# Patient Record
Sex: Female | Born: 1945 | Race: White | Hispanic: No | Marital: Married | Smoking: Former smoker
Health system: Southern US, Community
[De-identification: ages and names within clinical notes are randomized; demographics above are authoritative.]

## PROBLEM LIST (undated history)

## (undated) DIAGNOSIS — R600 Localized edema: Secondary | ICD-10-CM

## (undated) DIAGNOSIS — C9000 Multiple myeloma not having achieved remission: Secondary | ICD-10-CM

## (undated) DIAGNOSIS — E559 Vitamin D deficiency, unspecified: Secondary | ICD-10-CM

## (undated) DIAGNOSIS — J189 Pneumonia, unspecified organism: Secondary | ICD-10-CM

## (undated) DIAGNOSIS — K635 Polyp of colon: Secondary | ICD-10-CM

## (undated) DIAGNOSIS — J45909 Unspecified asthma, uncomplicated: Secondary | ICD-10-CM

## (undated) DIAGNOSIS — J90 Pleural effusion, not elsewhere classified: Secondary | ICD-10-CM

## (undated) DIAGNOSIS — I10 Essential (primary) hypertension: Secondary | ICD-10-CM

## (undated) DIAGNOSIS — E041 Nontoxic single thyroid nodule: Secondary | ICD-10-CM

## (undated) DIAGNOSIS — B019 Varicella without complication: Secondary | ICD-10-CM

## (undated) DIAGNOSIS — K5792 Diverticulitis of intestine, part unspecified, without perforation or abscess without bleeding: Secondary | ICD-10-CM

## (undated) DIAGNOSIS — R188 Other ascites: Secondary | ICD-10-CM

## (undated) DIAGNOSIS — E785 Hyperlipidemia, unspecified: Secondary | ICD-10-CM

## (undated) DIAGNOSIS — N390 Urinary tract infection, site not specified: Secondary | ICD-10-CM

## (undated) DIAGNOSIS — K219 Gastro-esophageal reflux disease without esophagitis: Secondary | ICD-10-CM

## (undated) DIAGNOSIS — D702 Other drug-induced agranulocytosis: Secondary | ICD-10-CM

## (undated) DIAGNOSIS — C801 Malignant (primary) neoplasm, unspecified: Secondary | ICD-10-CM

## (undated) HISTORY — PX: COLOSTOMY REVERSAL: SHX5782

## (undated) HISTORY — PX: KYPHOPLASTY: SHX5884

## (undated) HISTORY — DX: Malignant (primary) neoplasm, unspecified: C80.1

## (undated) HISTORY — DX: Other drug-induced agranulocytosis: D70.2

## (undated) HISTORY — PX: CHOLECYSTECTOMY: SHX55

## (undated) HISTORY — DX: Diverticulitis of intestine, part unspecified, without perforation or abscess without bleeding: K57.92

## (undated) HISTORY — DX: Polyp of colon: K63.5

## (undated) HISTORY — DX: Localized edema: R60.0

## (undated) HISTORY — PX: BREAST BIOPSY: SHX20

## (undated) HISTORY — DX: Unspecified asthma, uncomplicated: J45.909

## (undated) HISTORY — PX: ABDOMINAL HYSTERECTOMY: SHX81

## (undated) HISTORY — DX: Varicella without complication: B01.9

## (undated) HISTORY — DX: Gastro-esophageal reflux disease without esophagitis: K21.9

## (undated) HISTORY — DX: Nontoxic single thyroid nodule: E04.1

## (undated) HISTORY — DX: Essential (primary) hypertension: I10

## (undated) HISTORY — DX: Pneumonia, unspecified organism: J18.9

## (undated) HISTORY — DX: Other ascites: R18.8

## (undated) HISTORY — DX: Pleural effusion, not elsewhere classified: J90

## (undated) HISTORY — DX: Vitamin D deficiency, unspecified: E55.9

## (undated) HISTORY — DX: Urinary tract infection, site not specified: N39.0

## (undated) HISTORY — DX: Hyperlipidemia, unspecified: E78.5

---

## 1998-04-30 ENCOUNTER — Ambulatory Visit: Admission: RE | Admit: 1998-04-30 | Discharge: 1998-04-30 | Payer: Self-pay | Admitting: Oncology

## 2005-03-04 ENCOUNTER — Ambulatory Visit: Payer: Self-pay

## 2011-07-24 ENCOUNTER — Encounter: Payer: Self-pay | Admitting: Orthopedic Surgery

## 2011-08-21 ENCOUNTER — Encounter: Payer: Self-pay | Admitting: Orthopedic Surgery

## 2011-09-08 ENCOUNTER — Ambulatory Visit: Payer: Self-pay | Admitting: Pain Medicine

## 2011-09-22 ENCOUNTER — Ambulatory Visit: Payer: Self-pay | Admitting: Pain Medicine

## 2014-06-22 DIAGNOSIS — M8580 Other specified disorders of bone density and structure, unspecified site: Secondary | ICD-10-CM | POA: Insufficient documentation

## 2014-07-21 DIAGNOSIS — D649 Anemia, unspecified: Secondary | ICD-10-CM | POA: Insufficient documentation

## 2014-07-21 DIAGNOSIS — M549 Dorsalgia, unspecified: Secondary | ICD-10-CM | POA: Insufficient documentation

## 2014-07-21 DIAGNOSIS — I10 Essential (primary) hypertension: Secondary | ICD-10-CM | POA: Insufficient documentation

## 2014-07-27 ENCOUNTER — Ambulatory Visit: Payer: Self-pay | Admitting: Internal Medicine

## 2014-10-26 DIAGNOSIS — E538 Deficiency of other specified B group vitamins: Secondary | ICD-10-CM | POA: Insufficient documentation

## 2015-01-25 DIAGNOSIS — J302 Other seasonal allergic rhinitis: Secondary | ICD-10-CM | POA: Insufficient documentation

## 2015-07-02 ENCOUNTER — Other Ambulatory Visit: Payer: Self-pay | Admitting: Internal Medicine

## 2015-07-02 DIAGNOSIS — Z1231 Encounter for screening mammogram for malignant neoplasm of breast: Secondary | ICD-10-CM

## 2015-07-30 ENCOUNTER — Ambulatory Visit: Payer: Self-pay

## 2015-08-30 ENCOUNTER — Ambulatory Visit
Admission: RE | Admit: 2015-08-30 | Discharge: 2015-08-30 | Disposition: A | Discharge status: Home / Self Care | Payer: Commercial Managed Care - HMO | Source: Ambulatory Visit | Attending: Internal Medicine | Admitting: Internal Medicine

## 2015-08-30 ENCOUNTER — Other Ambulatory Visit: Payer: Self-pay | Admitting: Internal Medicine

## 2015-08-30 ENCOUNTER — Ambulatory Visit: Payer: Self-pay

## 2015-08-30 DIAGNOSIS — Z1231 Encounter for screening mammogram for malignant neoplasm of breast: Secondary | ICD-10-CM | POA: Insufficient documentation

## 2015-10-01 DIAGNOSIS — K219 Gastro-esophageal reflux disease without esophagitis: Secondary | ICD-10-CM | POA: Insufficient documentation

## 2016-10-20 HISTORY — PX: COLOSTOMY: SHX63

## 2017-03-01 DIAGNOSIS — K922 Gastrointestinal hemorrhage, unspecified: Secondary | ICD-10-CM | POA: Insufficient documentation

## 2017-03-01 DIAGNOSIS — C9000 Multiple myeloma not having achieved remission: Secondary | ICD-10-CM | POA: Insufficient documentation

## 2017-03-01 DIAGNOSIS — E876 Hypokalemia: Secondary | ICD-10-CM | POA: Insufficient documentation

## 2017-03-18 DIAGNOSIS — R600 Localized edema: Secondary | ICD-10-CM | POA: Insufficient documentation

## 2017-03-18 DIAGNOSIS — Z933 Colostomy status: Secondary | ICD-10-CM | POA: Insufficient documentation

## 2017-09-07 DIAGNOSIS — Z9889 Other specified postprocedural states: Secondary | ICD-10-CM | POA: Insufficient documentation

## 2017-12-02 LAB — HM DEXA SCAN

## 2017-12-22 DIAGNOSIS — C7951 Secondary malignant neoplasm of bone: Secondary | ICD-10-CM | POA: Insufficient documentation

## 2018-01-04 ENCOUNTER — Telehealth: Payer: Self-pay | Admitting: Pharmacist

## 2018-01-04 ENCOUNTER — Inpatient Hospital Stay: Payer: Medicare PPO | Attending: Oncology | Admitting: Oncology

## 2018-01-04 ENCOUNTER — Telehealth: Payer: Self-pay

## 2018-01-04 ENCOUNTER — Other Ambulatory Visit: Payer: Self-pay

## 2018-01-04 ENCOUNTER — Inpatient Hospital Stay: Payer: Medicare PPO

## 2018-01-04 ENCOUNTER — Encounter: Payer: Self-pay | Admitting: Oncology

## 2018-01-04 VITALS — BP 118/77 | HR 71 | Temp 97.8°F | Resp 16 | Ht 62.0 in | Wt 130.7 lb

## 2018-01-04 DIAGNOSIS — R600 Localized edema: Secondary | ICD-10-CM | POA: Diagnosis not present

## 2018-01-04 DIAGNOSIS — M545 Low back pain: Secondary | ICD-10-CM | POA: Insufficient documentation

## 2018-01-04 DIAGNOSIS — Z885 Allergy status to narcotic agent status: Secondary | ICD-10-CM | POA: Diagnosis not present

## 2018-01-04 DIAGNOSIS — Z79899 Other long term (current) drug therapy: Secondary | ICD-10-CM | POA: Diagnosis not present

## 2018-01-04 DIAGNOSIS — R5383 Other fatigue: Secondary | ICD-10-CM

## 2018-01-04 DIAGNOSIS — Z720 Tobacco use: Secondary | ICD-10-CM | POA: Insufficient documentation

## 2018-01-04 DIAGNOSIS — K1379 Other lesions of oral mucosa: Secondary | ICD-10-CM | POA: Insufficient documentation

## 2018-01-04 DIAGNOSIS — M549 Dorsalgia, unspecified: Secondary | ICD-10-CM

## 2018-01-04 DIAGNOSIS — I10 Essential (primary) hypertension: Secondary | ICD-10-CM | POA: Diagnosis not present

## 2018-01-04 DIAGNOSIS — G8929 Other chronic pain: Secondary | ICD-10-CM | POA: Insufficient documentation

## 2018-01-04 DIAGNOSIS — G629 Polyneuropathy, unspecified: Secondary | ICD-10-CM | POA: Diagnosis not present

## 2018-01-04 DIAGNOSIS — D696 Thrombocytopenia, unspecified: Secondary | ICD-10-CM | POA: Diagnosis not present

## 2018-01-04 DIAGNOSIS — Z7189 Other specified counseling: Secondary | ICD-10-CM | POA: Insufficient documentation

## 2018-01-04 DIAGNOSIS — R5381 Other malaise: Secondary | ICD-10-CM

## 2018-01-04 DIAGNOSIS — C7951 Secondary malignant neoplasm of bone: Secondary | ICD-10-CM | POA: Insufficient documentation

## 2018-01-04 DIAGNOSIS — Z87891 Personal history of nicotine dependence: Secondary | ICD-10-CM | POA: Diagnosis not present

## 2018-01-04 DIAGNOSIS — M4856XS Collapsed vertebra, not elsewhere classified, lumbar region, sequela of fracture: Secondary | ICD-10-CM | POA: Insufficient documentation

## 2018-01-04 DIAGNOSIS — S32050D Wedge compression fracture of fifth lumbar vertebra, subsequent encounter for fracture with routine healing: Secondary | ICD-10-CM

## 2018-01-04 DIAGNOSIS — K219 Gastro-esophageal reflux disease without esophagitis: Secondary | ICD-10-CM

## 2018-01-04 DIAGNOSIS — X58XXXS Exposure to other specified factors, sequela: Secondary | ICD-10-CM | POA: Diagnosis not present

## 2018-01-04 DIAGNOSIS — E785 Hyperlipidemia, unspecified: Secondary | ICD-10-CM | POA: Insufficient documentation

## 2018-01-04 DIAGNOSIS — C9002 Multiple myeloma in relapse: Secondary | ICD-10-CM | POA: Diagnosis not present

## 2018-01-04 DIAGNOSIS — Z5111 Encounter for antineoplastic chemotherapy: Secondary | ICD-10-CM | POA: Insufficient documentation

## 2018-01-04 LAB — COMPREHENSIVE METABOLIC PANEL
ALK PHOS: 85 U/L (ref 38–126)
ALT: 17 U/L (ref 14–54)
ANION GAP: 7 (ref 5–15)
AST: 19 U/L (ref 15–41)
Albumin: 3.4 g/dL — ABNORMAL LOW (ref 3.5–5.0)
BILIRUBIN TOTAL: 1.2 mg/dL (ref 0.3–1.2)
BUN: 13 mg/dL (ref 6–20)
CALCIUM: 8.4 mg/dL — AB (ref 8.9–10.3)
CO2: 24 mmol/L (ref 22–32)
Chloride: 105 mmol/L (ref 101–111)
Creatinine, Ser: 0.64 mg/dL (ref 0.44–1.00)
GFR calc Af Amer: >60 mL/min (ref >60)
GLUCOSE: 138 mg/dL — AB (ref 65–99)
POTASSIUM: 3.9 mmol/L (ref 3.5–5.1)
Sodium: 136 mmol/L (ref 135–145)
TOTAL PROTEIN: 7.9 g/dL (ref 6.5–8.1)

## 2018-01-04 LAB — CBC WITH DIFFERENTIAL/PLATELET
BASOS ABS: 0 10*3/uL (ref 0–0.1)
BASOS PCT: 0 %
Eosinophils Absolute: 0 10*3/uL (ref 0–0.7)
Eosinophils Relative: 1 %
HEMATOCRIT: 40.5 % (ref 35.0–47.0)
Hemoglobin: 13.7 g/dL (ref 12.0–16.0)
Lymphocytes Relative: 18 %
Lymphs Abs: 1 10*3/uL (ref 1.0–3.6)
MCH: 31.2 pg (ref 26.0–34.0)
MCHC: 33.8 g/dL (ref 32.0–36.0)
MCV: 92.1 fL (ref 80.0–100.0)
MONO ABS: 0.7 10*3/uL (ref 0.2–0.9)
Monocytes Relative: 12 %
NEUTROS ABS: 4.1 10*3/uL (ref 1.4–6.5)
Neutrophils Relative %: 69 %
Platelets: 166 10*3/uL (ref 150–440)
RBC: 4.39 MIL/uL (ref 3.80–5.20)
RDW: 14.5 % (ref 11.5–14.5)
WBC: 5.9 10*3/uL (ref 3.6–11.0)

## 2018-01-04 NOTE — Telephone Encounter (Signed)
Oral Chemotherapy Pharmacist Encounter   Received call and fax from [place] that there was an issue with this patient's REMS. The patient had both Revlimid and Pomalyst active. I informed them that the patient was transitioning from Revlimid to Pomalyst and they could inactivate the Revlimid profile.  In order for our office to be issued a REMs auth number, the patient's old office will have to call and cancel their authorization. From Epic it looks like the prescription for Pomalyst was written by Dr. Agapito Games [facility], tele: [phone]). Call Dr. Dorthy Cooler office and left message for his nurse asking that they call [employer] REMS and cancel their authorization. I also asked that they let me know when they had a chance to make that call to [place].  Darl Pikes, PharmD, BCPS Hematology/Oncology Clinical Pharmacist [facility] [phone]  01/04/2018 2:39 PM

## 2018-01-04 NOTE — Progress Notes (Signed)
Hematology/Oncology Consult note [facility] [phone] [phone]   Patient Care Team: Patient, No Pcp Per as PCP - General (General Practice)  REFERRING PROVIDER: Dr. Tonia Brooms  CHIEF COMPLAINTS/PURPOSE OF CONSULTATION:  Evaluation of MM  HISTORY OF PRESENTING ILLNESS:  Cassie Jones is a  72 y.o.  female with PMH listed below who was referred to me for evaluation of multiple myeloma.  Patient used to follow-up with local oncologist Dr. Tonia Brooms and [facility] oncologist Dr. Amalia Hailey.  Patient tells me that as her husband works every day and has a busy working schedule, she is not able to get transportation to her treatment.  As an alternative she is now living with her brother who is close to [facility] [place] and want to transfer care here. Extensive medical record review was performed.    Patient was diagnosed with IgG lambda, ISS2, t (4,14) multiple myeloma on February 03, 2017, she has hemoglobin of 9, calcium 8.9, LDH 132, beta microglobulin 4.6, albumin 3.2.  Skeletal survey negative.  PET scan with single nonspecific humeral lesion and a thyroid nodule.  Marrow with 60% plasmacytosis by CD138 IHC, FISH with hyperdiploidy t(4, 14), 1 q. gain.. Per note, she was treated with CyborD (M spike 3.6 at start)  from 01/2017 to 02/2017. Her treatment was switched to Revlimid Velcade dexamethasone from 02/2017, M spike 1.2 at start,and was found to have progression of disease in 09/2017, when her M spike increased to 1.8, and developed compression fracture in 10/2016. She was restarted on RVD using weekly Velcade.  She was recently seen by Dr. Boris Sharper and recommended to start on a combination of daratumumab, Pomalyst, dexamethasone. Per patient she received her first daratumumab last week with split dose 76m/kg given on 3/14 and 3/15.  Patient reports that she tolerate the treatment well except mild infusion reaction.  Patient  also reports that she has got dental clearance and has been given 1 dose of Zometa by Dr. VRemus Loffler(unkown dates).  [facility]  Review of Systems  Constitutional: Positive for malaise/fatigue. Negative for chills and fever.  HENT: Negative for ear discharge, hearing loss and nosebleeds.   Eyes: Negative for photophobia and pain.  Respiratory: Negative for cough and shortness of breath.   Cardiovascular: Negative for chest pain.  Gastrointestinal: Negative for heartburn, nausea and vomiting.  Genitourinary: Negative for dysuria and urgency.  Musculoskeletal: Positive for back pain.  Skin: Negative for rash.  Neurological: Negative for dizziness and weakness.  Endo/Heme/Allergies: Does not bruise/bleed easily.  Psychiatric/Behavioral: Negative for depression.    MEDICAL HISTORY:  Past Medical History:  Diagnosis Date  . [place]   . GERD (gastroesophageal reflux disease)   . Hypertension     SURGICAL HISTORY: Past Surgical History:  Procedure Laterality Date  . BREAST BIOPSY Bilateral yrs ago   benign  . COLOSTOMY  2018  . COLOSTOMY REVERSAL     11 2018    SOCIAL HISTORY: Social History   Socioeconomic History  . Marital status: Married    Spouse name: Not on file  . Number of children: Not on file  . Years of education: Not on file  . Highest education level: Not on file  Social Needs  . Financial resource strain: Not on file  . Food insecurity - worry: Not on file  . Food insecurity - inability: Not on file  .  Transportation needs - medical: Not on file  . Transportation needs - non-medical: Not on file  Occupational History  . Not on file  Tobacco Use  . Smoking status: Not on file  Substance and Sexual Activity  . Alcohol use: Not on file  . Drug use: Not on file  . Sexual activity: Not on file  Other Topics Concern  . Not on file  Social History  Narrative  . Not on file    FAMILY HISTORY: Family History  Problem Relation Age of Onset  . Breast cancer Mother 13  . Cancer Brother 66       proastate     ALLERGIES:  is allergic to codeine.  MEDICATIONS:  Current Outpatient Medications  Medication Sig Dispense Refill  . albuterol (VENTOLIN HFA) 108 (90 Base) MCG/ACT inhaler Inhale 2 puffs into the lungs every 6 (six) hours as needed.    . montelukast (SINGULAIR) 10 MG tablet Take 1 tablet by mouth daily.    . ondansetron (ZOFRAN) 8 MG tablet Take 1 tablet by mouth every 8 (eight) hours as needed.    Marland Kitchen oxyCODONE (OXY IR/ROXICODONE) 5 MG immediate release tablet Take 1 tablet by mouth 3 (three) times daily as needed.    . pantoprazole (PROTONIX) 40 MG tablet Take 1 tablet by mouth every morning.    Marland Kitchen POMALYST 4 MG capsule Take 1 tablet by mouth daily.    . pravastatin (PRAVACHOL) 10 MG tablet Take 1 tablet by mouth at bedtime.    Marland Kitchen spironolactone (ALDACTONE) 25 MG tablet Take 1 tablet by mouth daily.     No current facility-administered medications for this visit.      PHYSICAL EXAMINATION: ECOG PERFORMANCE STATUS: 1 - Symptomatic but completely ambulatory Vitals:   01/04/18 1044 01/04/18 1104  BP:  118/77  Pulse:  71  Resp: 16   Temp:  97.8 F (36.6 C)   Filed Weights   01/04/18 1044  Weight: 130 lb 11.2 oz (59.3 kg)    Physical Exam  Constitutional: She is oriented to person, place, and time and well-developed, well-nourished, and in no distress. No distress.  HENT:  Head: Normocephalic and atraumatic.  Eyes: EOM are normal. Pupils are equal, round, and reactive to light. No scleral icterus.  Neck: Normal range of motion. Neck supple.  Cardiovascular: Normal rate and regular rhythm.  No murmur heard. Pulmonary/Chest: Effort normal and breath sounds normal.  Abdominal: Soft. She exhibits no distension.  Musculoskeletal: Normal range of motion. She exhibits no edema.  Lymphadenopathy:    She has no cervical  adenopathy.  Neurological: She is alert and oriented to person, place, and time.  Skin: Skin is warm and dry. No erythema.  Psychiatric: Affect and judgment normal.     LABORATORY DATA:  I have reviewed the data as listed Lab Results  Component Value Date   WBC 5.9 01/04/2018   HGB 13.7 01/04/2018   HCT 40.5 01/04/2018   MCV 92.1 01/04/2018   PLT 166 01/04/2018   Recent Labs    01/04/18 1308  NA 136  K 3.9  CL 105  CO2 24  GLUCOSE 138*  BUN 13  CREATININE 0.64  CALCIUM 8.4*  GFRNONAA >60  GFRAA >60  PROT 7.9  ALBUMIN 3.4*  AST 19  ALT 17  ALKPHOS 85  BILITOT 1.2       ASSESSMENT & PLAN:  1. Multiple myeloma in relapse (Max)   2. Goals of care, counseling/discussion   3. Closed compression fracture  of L5 lumbar vertebra with routine healing, subsequent encounter   4. Chronic midline low back pain without sciatica   5. Neuropathy    #Multiple myeloma : discussed with patient that we will continue her current regimen with daratumumab, dexamethasone, plus minus Pomalyst.  Our pharmacist had a conversation with patient and will follow up on her Pomalyst status.  Discussed with patient that the treatment plan is based on her medical records and a bone marrow results done at outside facility.  Plan daratumumab 16 mg/m2 weekly for total of 8 weeks, following by Q2 weeks from week 9-24, followed by monthly from week 25 and beyond.  Will add Pomalyst D1-21 every 28 days when medication is available to patient.   Patient reports that she has Singulair 36m prior to treatment. Refer to Radonc for evaluation of palliative radiation.  # Neuropathy: continue gabapentin.  # Check labs today, cbc, cmp, SPEP, IFE, light chain.  # Chronic back pain: continue oxycodone 580mQ8h as needed.  All questions were answered. The patient knows to call the clinic with any problems questions or concerns.  Return of visit: 01/08/2018 for assessment prior to week 2 daratumumab.  Thank you  for this kind referral and the opportunity to participate in the care of this patient. A copy of today's note is routed to referring provider    ZhEarlie ServerMD, PhD Hematology Oncology [facility]- [phone]/18/2019

## 2018-01-04 NOTE — Progress Notes (Signed)
START ON PATHWAY REGIMEN - Multiple Myeloma and Other Plasma Cell Dyscrasias     A cycle is every 28 days:     Daratumumab      Pomalidomide      Dexamethasone      Dexamethasone   **Always confirm dose/schedule in your pharmacy ordering system**    Patient Characteristics: Relapsed / Refractory, All Lines of Therapy R-ISS Staging: II Disease Classification: Relapsed Line of Therapy: Second Line Intent of Therapy: Non-Curative / Palliative Intent, Discussed with Patient

## 2018-01-04 NOTE — Telephone Encounter (Signed)
Oral Oncology Pharmacy Student Encounter  Received new prescription for Pomalyst (pomalidomide) for the treatment of multiple myeloma in conjunction with daratumumab, planned duration until progression or unacceptable toxicities.  CBC and CMET from 01/04/18 assessed, no signicant lab abnormalities identified.  Prescription dose and frequency assessed.  Current medication list in Epic reviewed, no relevant DDIs with Pomalyst identified.  Prescription has been e-scribed to the [facility] for benefits analysis and approval.  Oral Oncology Clinic will continue to follow for insurance authorization, copayment issues, initial counseling and start date.

## 2018-01-04 NOTE — Progress Notes (Signed)
Patient here for initial visit. She has chronic back pain, she had bowl surgery last year.

## 2018-01-05 ENCOUNTER — Telehealth: Payer: Self-pay | Admitting: Oncology

## 2018-01-05 ENCOUNTER — Inpatient Hospital Stay: Payer: Medicare PPO

## 2018-01-05 NOTE — Telephone Encounter (Signed)
Oral Oncology Patient Advocate Encounter  Called [employer] to check on patients Pomalyst being shipped out. They are trying to reach patient to set up shipment.   Will check back later [place] Patient Advocate [phone] 01/05/2018 9:45 AM

## 2018-01-05 NOTE — Patient Instructions (Addendum)
Daratumumab injection What is this medicine? DARATUMUMAB (dar a toom ue mab) is a monoclonal antibody. It is used to treat multiple myeloma. This medicine may be used for other purposes; ask your health care provider or pharmacist if you have questions. COMMON BRAND NAME(S): DARZALEX What should I tell my health care provider before I take this medicine? They need to know if you have any of these conditions: -infection (especially a virus infection such as chickenpox, cold sores, or herpes) -lung or breathing disease -pregnant or trying to get pregnant -breast-feeding -an unusual or allergic reaction to daratumumab, other medicines, foods, dyes, or preservatives How should I use this medicine? This medicine is for infusion into a vein. It is given by a health care professional in a hospital or clinic setting. Talk to your pediatrician regarding the use of this medicine in children. Special care may be needed. Overdosage: If you think you have taken too much of this medicine contact a poison control center or emergency room at once. NOTE: This medicine is only for you. Do not share this medicine with others. What if I miss a dose? Keep appointments for follow-up doses as directed. It is important not to miss your dose. Call your doctor or health care professional if you are unable to keep an appointment. What may interact with this medicine? Interactions have not been studied. Give your health care provider a list of all the medicines, herbs, non-prescription drugs, or dietary supplements you use. Also tell them if you smoke, drink alcohol, or use illegal drugs. Some items may interact with your medicine. This list may not describe all possible interactions. Give your health care provider a list of all the medicines, herbs, non-prescription drugs, or dietary supplements you use. Also tell them if you smoke, drink alcohol, or use illegal drugs. Some items may interact with your medicine. What  should I watch for while using this medicine? This drug may make you feel generally unwell. Report any side effects. Continue your course of treatment even though you feel ill unless your doctor tells you to stop. This medicine can cause serious allergic reactions. To reduce your risk you may need to take medicine before treatment with this medicine. Take your medicine as directed. This medicine can affect the results of blood tests to match your blood type. These changes can last for up to 6 months after the final dose. Your healthcare provider will do blood tests to match your blood type before you start treatment. Tell all of your healthcare providers that you are being treated with this medicine before receiving a blood transfusion. This medicine can affect the results of some tests used to determine treatment response; extra tests may be needed to evaluate response. Do not become pregnant while taking this medicine or for 3 months after stopping it. Women should inform their doctor if they wish to become pregnant or think they might be pregnant. There is a potential for serious side effects to an unborn child. Talk to your health care professional or pharmacist for more information. What side effects may I notice from receiving this medicine? Side effects that you should report to your doctor or health care professional as soon as possible: -allergic reactions like skin rash, itching or hives, swelling of the face, lips, or tongue -breathing problems -chills -cough -dizziness -feeling faint or lightheaded -headache -low blood counts - this medicine may decrease the number of white blood cells, red blood cells and platelets. You may be at increased risk  for infections and bleeding. -nausea, vomiting -shortness of breath -signs of decreased platelets or bleeding - bruising, pinpoint red spots on the skin, black, tarry stools, blood in the urine -signs of decreased red blood cells - unusually  weak or tired, feeling faint or lightheaded, falls -signs of infection - fever or chills, cough, sore throat, pain or difficulty passing urine Side effects that usually do not require medical attention (report to your doctor or health care professional if they continue or are bothersome): -back pain -diarrhea -muscle cramps -pain, tingling, numbness in the hands or feet -swelling of the ankles, feet, hands -tiredness This list may not describe all possible side effects. Call your doctor for medical advice about side effects. You may report side effects to FDA at 1-800-FDA-1088. Where should I keep my medicine? Keep out of the reach of children. This drug is given in a hospital or clinic and will not be stored at home. NOTE: This sheet is a summary. It may not cover all possible information. If you have questions about this medicine, talk to your doctor, pharmacist, or health care provider.  2018 Elsevier/Gold Standard (2015-11-08 10:38:11)  

## 2018-01-05 NOTE — Telephone Encounter (Signed)
Oral Oncology Patient Advocate Encounter  Called patient and asked her to please call [employer] to get her Pomalyst set up. Gave her their number to call.    [facility] Patient Advocate [phone] 01/05/2018 2:07 PM

## 2018-01-05 NOTE — Telephone Encounter (Signed)
Oral Chemotherapy Pharmacist Encounter   Amy, RN from Dr. Agapito Games office called me this morning and gave me the information on the Pomalyst REMs Auth # [id number]. They sent the prescription to [facility]. With this information we can fill her first cycle of Pomalyst then write for the pomalyst from our office with cycle 2.   We will follow-up with [facility] about this patient's prescription  Darl Pikes, PharmD, BCPS Hematology/Oncology Clinical Pharmacist [facility] [phone]  01/05/2018 8:29 AM

## 2018-01-06 LAB — MULTIPLE MYELOMA PANEL, SERUM
ALBUMIN/GLOB SERPL: 0.9 (ref 0.7–1.7)
ALPHA 1: 0.3 g/dL (ref 0.0–0.4)
ALPHA2 GLOB SERPL ELPH-MCNC: 1.1 g/dL — AB (ref 0.4–1.0)
Albumin SerPl Elph-Mcnc: 3.2 g/dL (ref 2.9–4.4)
B-Globulin SerPl Elph-Mcnc: 0.9 g/dL (ref 0.7–1.3)
GAMMA GLOB SERPL ELPH-MCNC: 1.8 g/dL (ref 0.4–1.8)
GLOBULIN, TOTAL: 4 g/dL — AB (ref 2.2–3.9)
IGA: 15 mg/dL — AB (ref 87–352)
IgG (Immunoglobin G), Serum: 2397 mg/dL — ABNORMAL HIGH (ref 700–1600)
IgM (Immunoglobulin M), Srm: 27 mg/dL (ref 26–217)
M Protein SerPl Elph-Mcnc: 1.5 g/dL — ABNORMAL HIGH
Total Protein ELP: 7.2 g/dL (ref 6.0–8.5)

## 2018-01-06 NOTE — Telephone Encounter (Signed)
Oral Oncology Patient Advocate Encounter   Called [employer] patients Pomalyst will arrive today.   [place] Patient Advocate [phone] 01/06/2018 9:04 AM

## 2018-01-07 ENCOUNTER — Ambulatory Visit
Admission: RE | Admit: 2018-01-07 | Discharge: 2018-01-07 | Disposition: A | Discharge status: Home / Self Care | Payer: Medicare PPO | Source: Ambulatory Visit | Attending: Radiation Oncology | Admitting: Radiation Oncology

## 2018-01-07 ENCOUNTER — Encounter: Payer: Self-pay | Admitting: Radiation Oncology

## 2018-01-07 ENCOUNTER — Other Ambulatory Visit: Payer: Self-pay

## 2018-01-07 VITALS — BP 144/93 | HR 59 | Temp 98.0°F | Resp 16 | Ht 62.0 in | Wt 135.7 lb

## 2018-01-07 DIAGNOSIS — K219 Gastro-esophageal reflux disease without esophagitis: Secondary | ICD-10-CM | POA: Diagnosis not present

## 2018-01-07 DIAGNOSIS — E041 Nontoxic single thyroid nodule: Secondary | ICD-10-CM | POA: Diagnosis not present

## 2018-01-07 DIAGNOSIS — C9002 Multiple myeloma in relapse: Secondary | ICD-10-CM | POA: Diagnosis not present

## 2018-01-07 DIAGNOSIS — I10 Essential (primary) hypertension: Secondary | ICD-10-CM | POA: Insufficient documentation

## 2018-01-07 DIAGNOSIS — Z803 Family history of malignant neoplasm of breast: Secondary | ICD-10-CM | POA: Diagnosis not present

## 2018-01-07 DIAGNOSIS — Z87891 Personal history of nicotine dependence: Secondary | ICD-10-CM | POA: Insufficient documentation

## 2018-01-07 DIAGNOSIS — Z79899 Other long term (current) drug therapy: Secondary | ICD-10-CM | POA: Insufficient documentation

## 2018-01-07 NOTE — Telephone Encounter (Signed)
Oral Chemotherapy Pharmacist Encounter   Spoke with [name] over the phone. She has received her medication in the mail. I asked her to bring the medication with her to clinic tomorrow and she will receive further instruction about starting her medication. I will also meet with the patient while she is in infusion and review her medication calendar.  Additional, I spoke with [facility] and requested they fax our office for future refill request as the patient has transferred her care to our office.   Cassie Jones, Cassie Jones, Cassie Jones Hematology/Oncology Clinical Pharmacist [facility] [phone]  01/07/2018 3:11 PM

## 2018-01-07 NOTE — Consult Note (Signed)
NEW PATIENT EVALUATION  Name: Cassie Jones  MRN: [id number]  Date:   01/07/2018     DOB: 1945/11/27   This 72 y.o. female patient presents to the clinic for initial evaluation of palliative ration therapy to lumbar spine for involvement of multiple myeloma.  REFERRING PHYSICIAN: Vicente, [place]:  Chief Complaint  Patient presents with  . Multiple Myeloma    DIAGNOSIS: The encounter diagnosis was Multiple myeloma in relapse ([place]).   PREVIOUS INVESTIGATIONS:  MRI scan of thoracic lumbar scan requested for my review Pathology reports reviewed Clinical notes reviewed  HPI: patient is a 72 year old female originally diagnosed in April 2018 with IgG lambda multiple myeloma. PET CT scan at that time showed nonspecific humeral lesion and a thyroid nodule. She had 60% plasmacytosis. She is currently onon a combination of daratumumab, Pomalyst, dexamethasone.under medical oncology's care. [facility]. She recent had her care transferred to be closer to relatives in [place]. She's been having significant back painnd developed a subacute fracture by MRI criteria involving L3. She had a kyphoplasty performed in February 2019 is currently on narcotic analgesics. She was referred to radiation oncology for consideration of palliative treatment to this region.she is in significant pain at this time although she is ambulating well has no focal neurologic deficits. Her cares been switched here in [place] and she is now referred to radiation oncology for consideration again of palliative treatment to the L3 vertebral body pain status post kyphoplasty.    PLANNED TREATMENT REGIMEN: palliative radiation therapy to L3  PAST MEDICAL HISTORY:  has a past medical history of Cancer ([place]), GERD (gastroesophageal reflux disease), and Hypertension.    PAST SURGICAL HISTORY:  Past Surgical History:  Procedure Laterality Date  . BREAST BIOPSY Bilateral yrs ago   benign  .  COLOSTOMY  2018  . COLOSTOMY REVERSAL     11 2018    FAMILY HISTORY: family history includes Breast cancer (age of onset: 52) in her mother; Cancer (age of onset: 84) in her brother.  SOCIAL HISTORY:  reports that she has quit smoking. She has never used smokeless tobacco. She reports that she drank alcohol. She reports that she does not use drugs.  ALLERGIES: Codeine  MEDICATIONS:  Current Outpatient Medications  Medication Sig Dispense Refill  . albuterol (VENTOLIN HFA) 108 (90 Base) MCG/ACT inhaler Inhale 2 puffs into the lungs every 6 (six) hours as needed.    . montelukast (SINGULAIR) 10 MG tablet Take 1 tablet by mouth daily.    . ondansetron (ZOFRAN) 8 MG tablet Take 1 tablet by mouth every 8 (eight) hours as needed.    Marland Kitchen oxyCODONE (OXY IR/ROXICODONE) 5 MG immediate release tablet Take 1 tablet by mouth 3 (three) times daily as needed.    . pantoprazole (PROTONIX) 40 MG tablet Take 1 tablet by mouth every morning.    Marland Kitchen POMALYST 4 MG capsule Take 1 tablet by mouth daily.    . pravastatin (PRAVACHOL) 10 MG tablet Take 1 tablet by mouth at bedtime.    Marland Kitchen spironolactone (ALDACTONE) 25 MG tablet Take 1 tablet by mouth daily.     No current facility-administered medications for this encounter.     ECOG PERFORMANCE STATUS:  1 - Symptomatic but completely ambulatory  REVIEW OF SYSTEMS: except for the significant back pain Patient denies any weight loss, fatigue, weakness, fever, chills or night sweats. Patient denies any loss of vision, blurred vision. Patient denies any ringing  of the ears or hearing loss.  No irregular heartbeat. Patient denies heart murmur or history of fainting. Patient denies any chest pain or pain radiating to her upper extremities. Patient denies any shortness of breath, difficulty breathing at night, cough or hemoptysis. Patient denies any swelling in the lower legs. Patient denies any nausea vomiting, vomiting of blood, or coffee ground material in the vomitus.  Patient denies any stomach pain. Patient states has had normal bowel movements no significant constipation or diarrhea. Patient denies any dysuria, hematuria or significant nocturia. Patient denies any problems walking, swelling in the joints or loss of balance. Patient denies any skin changes, loss of hair or loss of weight. Patient denies any excessive worrying or anxiety or significant depression. Patient denies any problems with insomnia. Patient denies excessive thirst, polyuria, polydipsia. Patient denies any swollen glands, patient denies easy bruising or easy bleeding. Patient denies any recent infections, allergies or URI. Patient "s visual fields have not changed significantly in recent time.    PHYSICAL EXAM: BP (!) 144/93 (BP Location: Right Wrist, Patient Position: Sitting, Cuff Size: Small)   Pulse (!) 59   Temp 98 F (36.7 C) (Tympanic)   Resp 16   Ht 5' 2" (1.575 m)   Wt 135 lb 11.2 oz (61.6 kg)   BMI 24.82 kg/m  A well-developed female in NAD. She is in exquisite pain to touch of the L3 vertebral body region. Motor sensory and DTR levels are equal and symmetric in the lower extremities. Proprioception is intact. Well-developed well-nourished patient in NAD. HEENT reveals PERLA, EOMI, discs not visualized.  Oral cavity is clear. No oral mucosal lesions are identified. Neck is clear without evidence of cervical or supraclavicular adenopathy. Lungs are clear to A&P. Cardiac examination is essentially unremarkable with regular rate and rhythm without murmur rub or thrill. Abdomen is benign with no organomegaly or masses noted. Motor sensory and DTR levels are equal and symmetric in the upper and lower extremities. Cranial nerves II through XII are grossly intact. Proprioception is intact. No peripheral adenopathy or edema is identified. No motor or sensory levels are noted. Crude visual fields are within normal range.  LABORATORY DATA: pathology reports are reviewed    RADIOLOGY  RESULTS:MRI scans of thoracic lumbar spine reports reviewed films have been requested for my review   IMPRESSION: multiple myeloma with subacute fracture of L3 most likely secondary to myelomatous involvement for palliative radiation therapy to the L3 vertebral body in 72 year old female  PLAN: at this time I to go ahead with a course of palliative radiation therapy to her L3 vertebral body. Would plan on delivering 3000 cGy in 10 fractions. Risks and benefits of treatment including possible diarrhea fatigue alteration of blood counts skin reaction all were discussed in detail. I personally set up and ordered CT simulation on the patient for early next week. She has a daily infusion tomorrow for her chemotherapy. Patient comprehends my treatment plan well.  I would like to take this opportunity to thank you for allowing me to participate in the care of your patient.Noreene Filbert, MD

## 2018-01-08 ENCOUNTER — Other Ambulatory Visit: Payer: Self-pay | Admitting: *Deleted

## 2018-01-08 ENCOUNTER — Inpatient Hospital Stay: Payer: Medicare PPO

## 2018-01-08 ENCOUNTER — Other Ambulatory Visit: Payer: Self-pay

## 2018-01-08 ENCOUNTER — Inpatient Hospital Stay (HOSPITAL_BASED_OUTPATIENT_CLINIC_OR_DEPARTMENT_OTHER): Payer: Medicare PPO | Admitting: Oncology

## 2018-01-08 ENCOUNTER — Encounter: Payer: Self-pay | Admitting: Oncology

## 2018-01-08 VITALS — BP 135/70 | HR 68 | Temp 95.7°F | Resp 28 | Wt 133.7 lb

## 2018-01-08 DIAGNOSIS — Z5111 Encounter for antineoplastic chemotherapy: Secondary | ICD-10-CM | POA: Diagnosis not present

## 2018-01-08 DIAGNOSIS — G629 Polyneuropathy, unspecified: Secondary | ICD-10-CM

## 2018-01-08 DIAGNOSIS — C9002 Multiple myeloma in relapse: Secondary | ICD-10-CM

## 2018-01-08 DIAGNOSIS — X58XXXS Exposure to other specified factors, sequela: Secondary | ICD-10-CM | POA: Diagnosis not present

## 2018-01-08 DIAGNOSIS — R5381 Other malaise: Secondary | ICD-10-CM | POA: Diagnosis not present

## 2018-01-08 DIAGNOSIS — C7951 Secondary malignant neoplasm of bone: Secondary | ICD-10-CM

## 2018-01-08 DIAGNOSIS — K219 Gastro-esophageal reflux disease without esophagitis: Secondary | ICD-10-CM | POA: Diagnosis not present

## 2018-01-08 DIAGNOSIS — M4856XS Collapsed vertebra, not elsewhere classified, lumbar region, sequela of fracture: Secondary | ICD-10-CM

## 2018-01-08 DIAGNOSIS — G8929 Other chronic pain: Secondary | ICD-10-CM

## 2018-01-08 DIAGNOSIS — S32050D Wedge compression fracture of fifth lumbar vertebra, subsequent encounter for fracture with routine healing: Secondary | ICD-10-CM

## 2018-01-08 DIAGNOSIS — I10 Essential (primary) hypertension: Secondary | ICD-10-CM

## 2018-01-08 DIAGNOSIS — M545 Low back pain: Secondary | ICD-10-CM | POA: Diagnosis not present

## 2018-01-08 DIAGNOSIS — Z79899 Other long term (current) drug therapy: Secondary | ICD-10-CM

## 2018-01-08 DIAGNOSIS — R5383 Other fatigue: Secondary | ICD-10-CM

## 2018-01-08 DIAGNOSIS — Z5112 Encounter for antineoplastic immunotherapy: Secondary | ICD-10-CM

## 2018-01-08 DIAGNOSIS — Z7189 Other specified counseling: Secondary | ICD-10-CM

## 2018-01-08 DIAGNOSIS — Z885 Allergy status to narcotic agent status: Secondary | ICD-10-CM

## 2018-01-08 DIAGNOSIS — Z87891 Personal history of nicotine dependence: Secondary | ICD-10-CM

## 2018-01-08 LAB — CBC WITH DIFFERENTIAL/PLATELET
BASOS PCT: 0 %
Basophils Absolute: 0 10*3/uL (ref 0–0.1)
EOS ABS: 0.1 10*3/uL (ref 0–0.7)
Eosinophils Relative: 1 %
HCT: 36.8 % (ref 35.0–47.0)
HEMOGLOBIN: 12.6 g/dL (ref 12.0–16.0)
LYMPHS ABS: 0.7 10*3/uL — AB (ref 1.0–3.6)
Lymphocytes Relative: 13 %
MCH: 31.3 pg (ref 26.0–34.0)
MCHC: 34.2 g/dL (ref 32.0–36.0)
MCV: 91.7 fL (ref 80.0–100.0)
Monocytes Absolute: 0.8 10*3/uL (ref 0.2–0.9)
Monocytes Relative: 14 %
NEUTROS PCT: 72 %
Neutro Abs: 3.8 10*3/uL (ref 1.4–6.5)
Platelets: 146 10*3/uL — ABNORMAL LOW (ref 150–440)
RBC: 4.01 MIL/uL (ref 3.80–5.20)
RDW: 14.5 % (ref 11.5–14.5)
WBC: 5.4 10*3/uL (ref 3.6–11.0)

## 2018-01-08 LAB — COMPREHENSIVE METABOLIC PANEL
ALT: 15 U/L (ref 14–54)
ANION GAP: 7 (ref 5–15)
AST: 15 U/L (ref 15–41)
Albumin: 3.2 g/dL — ABNORMAL LOW (ref 3.5–5.0)
Alkaline Phosphatase: 73 U/L (ref 38–126)
BUN: 13 mg/dL (ref 6–20)
CALCIUM: 8.7 mg/dL — AB (ref 8.9–10.3)
CHLORIDE: 105 mmol/L (ref 101–111)
CO2: 24 mmol/L (ref 22–32)
CREATININE: 0.71 mg/dL (ref 0.44–1.00)
Glucose, Bld: 107 mg/dL — ABNORMAL HIGH (ref 65–99)
Potassium: 3.9 mmol/L (ref 3.5–5.1)
Sodium: 136 mmol/L (ref 135–145)
Total Bilirubin: 1.1 mg/dL (ref 0.3–1.2)
Total Protein: 7.3 g/dL (ref 6.5–8.1)

## 2018-01-08 LAB — TYPE AND SCREEN
ABO/RH(D): B POS
ANTIBODY SCREEN: POSITIVE

## 2018-01-08 MED ORDER — PROCHLORPERAZINE MALEATE 10 MG PO TABS
10.0000 mg | ORAL_TABLET | Freq: Once | ORAL | Status: AC
  Administered 2018-01-08: 10 mg via ORAL
  Filled 2018-01-08: qty 1

## 2018-01-08 MED ORDER — DEXAMETHASONE SODIUM PHOSPHATE 100 MG/10ML IJ SOLN
20.0000 mg | Freq: Once | INTRAMUSCULAR | Status: AC
  Administered 2018-01-08: 20 mg via INTRAVENOUS
  Filled 2018-01-08: qty 2

## 2018-01-08 MED ORDER — SODIUM CHLORIDE 0.9 % IV SOLN
900.0000 mg | Freq: Once | INTRAVENOUS | Status: AC
  Administered 2018-01-08: 900 mg via INTRAVENOUS
  Filled 2018-01-08: qty 15

## 2018-01-08 MED ORDER — MORPHINE SULFATE (PF) 2 MG/ML IV SOLN
2.0000 mg | Freq: Once | INTRAVENOUS | Status: AC
  Administered 2018-01-08: 2 mg via INTRAVENOUS
  Filled 2018-01-08: qty 1

## 2018-01-08 MED ORDER — OXYCODONE HCL ER 10 MG PO T12A: 10.0000 mg | EXTENDED_RELEASE_TABLET | Freq: Two times a day (BID) | ORAL | 0 refills | Status: DC

## 2018-01-08 MED ORDER — SODIUM CHLORIDE 0.9 % IV SOLN
Freq: Once | INTRAVENOUS | Status: AC
  Administered 2018-01-08: 11:00:00 via INTRAVENOUS
  Filled 2018-01-08: qty 1000

## 2018-01-08 MED ORDER — ACETAMINOPHEN 325 MG PO TABS
650.0000 mg | ORAL_TABLET | Freq: Once | ORAL | Status: AC
  Administered 2018-01-08: 650 mg via ORAL
  Filled 2018-01-08: qty 2

## 2018-01-08 MED ORDER — DIPHENHYDRAMINE HCL 25 MG PO CAPS
50.0000 mg | ORAL_CAPSULE | Freq: Once | ORAL | Status: AC
  Administered 2018-01-08: 50 mg via ORAL
  Filled 2018-01-08: qty 2

## 2018-01-08 NOTE — Telephone Encounter (Signed)
Oral Chemotherapy Pharmacist Encounter   Met with patient in infusion and reviewed Pomalyst calendar with her. She know that he is to start her Pomalyst today.    Darl Pikes, PharmD, BCPS Hematology/Oncology Clinical Pharmacist [facility] [phone]  01/08/2018 11:55 AM

## 2018-01-08 NOTE — Progress Notes (Signed)
Here for follow up.  In pain she stated w level 8pain -stated pain meds "ease up pain" seen by Dr Donella Stade 3/21-and feels hopeful about resolving back pain

## 2018-01-08 NOTE — Progress Notes (Signed)
Hematology/Oncology Consult note [facility] [phone] [phone]   Patient Care Team: Patient, No Pcp Per as PCP - General (General Practice)  REFERRING PROVIDER: Dr. Tonia Brooms  CHIEF COMPLAINTS/PURPOSE OF CONSULTATION:  Evaluation of MM  HISTORY OF PRESENTING ILLNESS:  Cassie Jones is a  72 y.o.  female with PMH listed below who was referred to me for evaluation of multiple myeloma.  Patient used to follow-up with local oncologist Dr. Tonia Brooms and [facility] oncologist Dr. Amalia Hailey.  Patient tells me that as her husband works every day and has a busy working schedule, she is not able to get transportation to her treatment.  As an alternative she is now living with her brother who is close to [facility] [place] and want to transfer care here. Extensive medical record review was performed.    Patient was diagnosed with IgG lambda, ISS2, t (4,14) multiple myeloma on February 03, 2017, she has hemoglobin of 9, calcium 8.9, LDH 132, beta microglobulin 4.6, albumin 3.2.  Skeletal survey negative.  PET scan with single nonspecific humeral lesion and a thyroid nodule.  Marrow with 60% plasmacytosis by CD138 IHC, FISH with hyperdiploidy t(4, 14), 1 q. gain.. Per note, she was treated with CyborD (M spike 3.6 at start)  from 01/2017 to 02/2017. Her treatment was switched to Revlimid Velcade dexamethasone from 02/2017, M spike 1.2 at start,and was found to have progression of disease in 09/2017, when her M spike increased to 1.8, and developed compression fracture in 10/2016. She was restarted on RVD using weekly Velcade.  She was recently seen by Dr. Boris Sharper and recommended to start on a combination of daratumumab, Pomalyst, dexamethasone. Per patient she received her first daratumumab last week with split dose '8mg'$ /kg given on 3/14 and 3/15.  Patient reports that she tolerate the treatment well except mild infusion reaction.  Patient  also reports that she has got dental clearance and has been given 1 dose of Zometa by Dr. Remus Loffler (unkown dates).  Autologous transplant has been discussed with patient at [facility] and patient is still in the process of deciding.  Current Treatment # 3/14, 3/15 at outside facility, she got split dose of daratumumab. #  01/08/2018 Start on weekly daratumumab '16mg'$ /m2, dexamethasone '20mg'$ , Pomalyst '4mg'$  Day 1-21 ogf 28 day cycle.   INTERVAL HISTORY Cassie Jones is a 72 y.o. female who has above history reviewed by me today presents for follow up visit for management of multiple myeloma. Reports uncontrolled back pain. She has Oxycodone '5mg'$  Q8h as needed, feels pain is not controlled adequately. She has been referred to Radonc and plan palliative RT next week.  Reports feeling fatigue. Her sister in law accompanied her to clinic today.    Review of Systems  Constitutional: Positive for malaise/fatigue. Negative for chills, fever and weight loss.  HENT: Negative for ear discharge, hearing loss and nosebleeds.   Eyes: Negative for photophobia and pain.  Respiratory: Negative for cough, sputum production and shortness of breath.   Cardiovascular: Negative for chest pain, palpitations and orthopnea.  Gastrointestinal: Negative for heartburn, nausea and vomiting.  Genitourinary: Negative for dysuria, frequency and urgency.  Musculoskeletal: Positive for back pain.  Skin: Negative for rash.  Neurological: Negative for dizziness, tingling, tremors, sensory change and weakness.  Endo/Heme/Allergies: Does not bruise/bleed easily.  Psychiatric/Behavioral: Negative for depression, hallucinations and substance abuse.    MEDICAL HISTORY:  Past Medical History:  Diagnosis Date  . Cancer ([place])   . GERD (gastroesophageal  reflux disease)   . Hypertension     SURGICAL HISTORY: Past Surgical History:  Procedure Laterality Date  . BREAST BIOPSY Bilateral yrs ago   benign  . COLOSTOMY  2018  .  COLOSTOMY REVERSAL     11 2018    SOCIAL HISTORY: Social History   Socioeconomic History  . Marital status: Married    Spouse name: Not on file  . Number of children: Not on file  . Years of education: Not on file  . Highest education level: Not on file  Occupational History  . Not on file  Social Needs  . Financial resource strain: Not on file  . Food insecurity:    Worry: Not on file    Inability: Not on file  . Transportation needs:    Medical: Not on file    Non-medical: Not on file  Tobacco Use  . Smoking status: Former Research scientist (life sciences)  . Smokeless tobacco: Never Used  Substance and Sexual Activity  . Alcohol use: Not Currently  . Drug use: Never  . Sexual activity: Not on file  Lifestyle  . Physical activity:    Days per week: Not on file    Minutes per session: Not on file  . Stress: Not on file  Relationships  . Social connections:    Talks on phone: Not on file    Gets together: Not on file    Attends religious service: Not on file    Active member of club or organization: Not on file    Attends meetings of clubs or organizations: Not on file    Relationship status: Not on file  . Intimate partner violence:    Fear of current or ex partner: Not on file    Emotionally abused: Not on file    Physically abused: Not on file    Forced sexual activity: Not on file  Other Topics Concern  . Not on file  Social History Narrative  . Not on file    FAMILY HISTORY: Family History  Problem Relation Age of Onset  . Breast cancer Mother 68  . Cancer Brother 35       proastate     ALLERGIES:  is allergic to codeine.  MEDICATIONS:  Current Outpatient Medications  Medication Sig Dispense Refill  . montelukast (SINGULAIR) 10 MG tablet Take 1 tablet by mouth daily.    Marland Kitchen oxyCODONE (OXY IR/ROXICODONE) 5 MG immediate release tablet Take 1 tablet by mouth 3 (three) times daily as needed.    . pantoprazole (PROTONIX) 40 MG tablet Take 1 tablet by mouth every morning.    .  pravastatin (PRAVACHOL) 10 MG tablet Take 1 tablet by mouth at bedtime.    Marland Kitchen spironolactone (ALDACTONE) 25 MG tablet Take 1 tablet by mouth daily.    Marland Kitchen albuterol (VENTOLIN HFA) 108 (90 Base) MCG/ACT inhaler Inhale 2 puffs into the lungs every 6 (six) hours as needed.    . ondansetron (ZOFRAN) 8 MG tablet Take 1 tablet by mouth every 8 (eight) hours as needed.    Marland Kitchen oxyCODONE (OXYCONTIN) 10 mg 12 hr tablet Take 1 tablet (10 mg total) by mouth every 12 (twelve) hours. 28 tablet 0  . POMALYST 4 MG capsule Take 1 tablet by mouth daily.     No current facility-administered medications for this visit.      PHYSICAL EXAMINATION: ECOG PERFORMANCE STATUS: 1 - Symptomatic but completely ambulatory Vitals:   01/08/18 0928  BP: 135/70  Pulse: 68  Resp: (!) 28  Temp: (!) 95.7 F (35.4 C)   Filed Weights   01/08/18 0928  Weight: 133 lb 11.2 oz (60.6 kg)    Physical Exam  Constitutional: She is oriented to person, place, and time. No distress.  HENT:  Head: Normocephalic and atraumatic.  Mouth/Throat: No oropharyngeal exudate.  Eyes: Pupils are equal, round, and reactive to light. EOM are normal. No scleral icterus.  Neck: Normal range of motion. Neck supple.  Cardiovascular: Normal rate and regular rhythm.  No murmur heard. Pulmonary/Chest: Effort normal.  Abdominal: Soft. She exhibits no distension.  Musculoskeletal: Normal range of motion. She exhibits no edema.  Lymphadenopathy:    She has no cervical adenopathy.  Neurological: She is alert and oriented to person, place, and time. No cranial nerve deficit.  Skin: Skin is warm and dry. No erythema.  Psychiatric: Affect normal.     LABORATORY DATA:  I have reviewed the data as listed Lab Results  Component Value Date   WBC 5.4 01/08/2018   HGB 12.6 01/08/2018   HCT 36.8 01/08/2018   MCV 91.7 01/08/2018   PLT 146 (L) 01/08/2018   Recent Labs    01/04/18 1308 01/08/18 0902  NA 136 136  K 3.9 3.9  CL 105 105  CO2 24 24    GLUCOSE 138* 107*  BUN 13 13  CREATININE 0.64 0.71  CALCIUM 8.4* 8.7*  GFRNONAA >60 >60  GFRAA >60 >60  PROT 7.9 7.3  ALBUMIN 3.4* 3.2*  AST 19 15  ALT 17 15  ALKPHOS 85 73  BILITOT 1.2 1.1       ASSESSMENT & PLAN:  1. Multiple myeloma in relapse ([place])   2. Goals of care, counseling/discussion   3. Closed compression fracture of L5 lumbar vertebra with routine healing, subsequent encounter   4. Bone metastasis ([place])   5. Encounter for antineoplastic immunotherapy   6. Encounter for antineoplastic chemotherapy    #Multiple myeloma :  I explained to the patient the risks and benefits of Daratumumab and Pomalyst  including all but not limited to allergic reaction, confusion, CNS toxicity, nausea, vomiting, low blood counts, bleeding, and risk of life threatening infection and even death, secondary malignancy etc.  I will obtain a type and screen although she has already got her first treatment of Daratumumab at a outside facility. I have talked to labs and they will check blood bank from where patient transferred her care from to see if they have obtain any pretretment type and screen there.   Current chemotherapy plan daratumumab 16 mg/m2 weekly for total of 8 weeks, following by Q2 weeks from week 9-24, followed by monthly from week 25 and beyond.  Today Pomalyst D1-21 every 28 days.  She has singulair and brought her bottle to clinic. She took Singulair during this encounter. She forgets to take Dexamethasone but have the medication. I will give her Dexamethasone '20mg'$  IV today Advise patient to take her Dexamethasone '20mg'$  within 1-3 hour prior to her Daratumumab treatment. Also take her Singulair prior to treatment.   # Follow up with Radonc for palliative radiation.  # Neuropathy: continue gabapentin.  # # Chronic back pain: continue oxycodone '5mg'$  Q8h as needed. Start  All questions were answered. The patient knows to call the clinic with any problems questions or  concerns.  Return of visit: 1 week to see NP [name] for week 3 Daratumumab. See me in 2 weeks for week 4 Daratumumab.    Earlie Server, MD, PhD Hematology [facility]- [phone] 01/08/2018

## 2018-01-11 ENCOUNTER — Other Ambulatory Visit: Payer: Self-pay | Admitting: Family Medicine

## 2018-01-11 ENCOUNTER — Ambulatory Visit
Admission: RE | Admit: 2018-01-11 | Discharge: 2018-01-11 | Disposition: A | Discharge status: Home / Self Care | Payer: Medicare PPO | Source: Ambulatory Visit | Attending: Radiation Oncology | Admitting: Radiation Oncology

## 2018-01-11 DIAGNOSIS — C9002 Multiple myeloma in relapse: Secondary | ICD-10-CM | POA: Diagnosis present

## 2018-01-14 ENCOUNTER — Inpatient Hospital Stay (HOSPITAL_BASED_OUTPATIENT_CLINIC_OR_DEPARTMENT_OTHER): Payer: Medicare PPO | Admitting: Oncology

## 2018-01-14 ENCOUNTER — Telehealth: Payer: Self-pay | Admitting: *Deleted

## 2018-01-14 ENCOUNTER — Inpatient Hospital Stay: Payer: Medicare PPO

## 2018-01-14 ENCOUNTER — Ambulatory Visit: Payer: Medicare PPO

## 2018-01-14 ENCOUNTER — Ambulatory Visit
Admission: RE | Admit: 2018-01-14 | Discharge: 2018-01-14 | Disposition: A | Discharge status: Home / Self Care | Payer: Medicare PPO | Source: Ambulatory Visit | Attending: Oncology | Admitting: Oncology

## 2018-01-14 ENCOUNTER — Other Ambulatory Visit: Payer: Self-pay | Admitting: *Deleted

## 2018-01-14 VITALS — BP 146/78 | HR 60 | Temp 98.0°F | Wt 135.7 lb

## 2018-01-14 DIAGNOSIS — R609 Edema, unspecified: Secondary | ICD-10-CM

## 2018-01-14 DIAGNOSIS — I10 Essential (primary) hypertension: Secondary | ICD-10-CM

## 2018-01-14 DIAGNOSIS — Z79899 Other long term (current) drug therapy: Secondary | ICD-10-CM | POA: Diagnosis not present

## 2018-01-14 DIAGNOSIS — Z885 Allergy status to narcotic agent status: Secondary | ICD-10-CM

## 2018-01-14 DIAGNOSIS — C7951 Secondary malignant neoplasm of bone: Secondary | ICD-10-CM

## 2018-01-14 DIAGNOSIS — Z5111 Encounter for antineoplastic chemotherapy: Secondary | ICD-10-CM | POA: Diagnosis not present

## 2018-01-14 DIAGNOSIS — R0602 Shortness of breath: Secondary | ICD-10-CM

## 2018-01-14 DIAGNOSIS — C9002 Multiple myeloma in relapse: Secondary | ICD-10-CM | POA: Diagnosis not present

## 2018-01-14 DIAGNOSIS — K219 Gastro-esophageal reflux disease without esophagitis: Secondary | ICD-10-CM | POA: Diagnosis not present

## 2018-01-14 DIAGNOSIS — M545 Low back pain: Secondary | ICD-10-CM

## 2018-01-14 DIAGNOSIS — R600 Localized edema: Secondary | ICD-10-CM | POA: Diagnosis not present

## 2018-01-14 DIAGNOSIS — G629 Polyneuropathy, unspecified: Secondary | ICD-10-CM | POA: Diagnosis not present

## 2018-01-14 DIAGNOSIS — D696 Thrombocytopenia, unspecified: Secondary | ICD-10-CM | POA: Diagnosis not present

## 2018-01-14 DIAGNOSIS — K1379 Other lesions of oral mucosa: Secondary | ICD-10-CM | POA: Insufficient documentation

## 2018-01-14 DIAGNOSIS — Z87891 Personal history of nicotine dependence: Secondary | ICD-10-CM | POA: Diagnosis not present

## 2018-01-14 HISTORY — DX: Multiple myeloma not having achieved remission: C90.00

## 2018-01-14 LAB — CBC WITH DIFFERENTIAL/PLATELET
BASOS ABS: 0 10*3/uL (ref 0–0.1)
Basophils Relative: 0 %
EOS PCT: 2 %
Eosinophils Absolute: 0.1 10*3/uL (ref 0–0.7)
HCT: 36.4 % (ref 35.0–47.0)
Hemoglobin: 12.5 g/dL (ref 12.0–16.0)
LYMPHS PCT: 12 %
Lymphs Abs: 0.3 10*3/uL — ABNORMAL LOW (ref 1.0–3.6)
MCH: 31.2 pg (ref 26.0–34.0)
MCHC: 34.4 g/dL (ref 32.0–36.0)
MCV: 90.8 fL (ref 80.0–100.0)
Monocytes Absolute: 0.3 10*3/uL (ref 0.2–0.9)
Monocytes Relative: 11 %
Neutro Abs: 2.2 10*3/uL (ref 1.4–6.5)
Neutrophils Relative %: 75 %
PLATELETS: 129 10*3/uL — AB (ref 150–440)
RBC: 4.01 MIL/uL (ref 3.80–5.20)
RDW: 14.4 % (ref 11.5–14.5)
WBC: 2.9 10*3/uL — AB (ref 3.6–11.0)

## 2018-01-14 LAB — COMPREHENSIVE METABOLIC PANEL
ALT: 17 U/L (ref 14–54)
ANION GAP: 8 (ref 5–15)
AST: 16 U/L (ref 15–41)
Albumin: 3.1 g/dL — ABNORMAL LOW (ref 3.5–5.0)
Alkaline Phosphatase: 97 U/L (ref 38–126)
BUN: 13 mg/dL (ref 6–20)
CHLORIDE: 103 mmol/L (ref 101–111)
CO2: 24 mmol/L (ref 22–32)
Calcium: 8.7 mg/dL — ABNORMAL LOW (ref 8.9–10.3)
Creatinine, Ser: 0.59 mg/dL (ref 0.44–1.00)
Glucose, Bld: 105 mg/dL — ABNORMAL HIGH (ref 65–99)
POTASSIUM: 3.7 mmol/L (ref 3.5–5.1)
Sodium: 135 mmol/L (ref 135–145)
Total Bilirubin: 1.2 mg/dL (ref 0.3–1.2)
Total Protein: 7.1 g/dL (ref 6.5–8.1)

## 2018-01-14 LAB — FIBRIN DERIVATIVES D-DIMER (ARMC ONLY): FIBRIN DERIVATIVES D-DIMER (ARMC): 3701.3 ng{FEU}/mL — AB (ref 0.00–499.00)

## 2018-01-14 MED ORDER — TECHNETIUM TO 99M ALBUMIN AGGREGATED
4.0300 | Freq: Once | INTRAVENOUS | Status: AC | PRN
  Administered 2018-01-14: 4.03 via INTRAVENOUS

## 2018-01-14 MED ORDER — IOPAMIDOL (ISOVUE-370) INJECTION 76%
75.0000 mL | Freq: Once | INTRAVENOUS | Status: AC | PRN
  Administered 2018-01-14: 75 mL via INTRAVENOUS

## 2018-01-14 MED ORDER — MAGIC MOUTHWASH W/LIDOCAINE: 5.0000 mL | Freq: Three times a day (TID) | ORAL | 0 refills | Status: DC

## 2018-01-14 MED ORDER — TECHNETIUM TC 99M DIETHYLENETRIAME-PENTAACETIC ACID
32.5600 | Freq: Once | INTRAVENOUS | Status: AC | PRN
  Administered 2018-01-14: 32.56 via RESPIRATORY_TRACT

## 2018-01-14 NOTE — Progress Notes (Signed)
Symptom Management Consult note [facility]  [phone] [phone]  Patient Care Team: Patient, No Pcp Per as PCP - General (General Practice)   Name of the patient: Cassie Jones  [id number]  08-16-1946   Date of visit: 01/15/18  Diagnosis- Multiple Myeloma  Chief complaint/ Reason for visit- Swelling  Heme/Onc history: Patient last seen and evaluated by primary oncologist Dr. Tasia Catchings on 01/08/2018 for evaluation of multiple myeloma. Recent transfer of care from [facility] due to transportation issues. At that time she reported uncontrolled back pain. Currently taking oxycodone 5 mg every 8 as needed. She was referred to radiation oncology for palliative radiation next week. Reported feeling fatigued.  Diagnosed with multiple myeloma on 02/03/2017. Skeletal survey was negative. Most recent treatment was recommended Velcade and Decadron. Recently evaluated by Dr. Boris Sharper to start on a combination of daratumumab, Pomalyst and dexamethasone. Received first split dose of therapy on 3/14 and 3/15. Tolerated well except for mild infusion reaction. Has received 1 dose of Zometa. Autologous transplant was discussed that she is still deciding at this time.  Interval history-  Patient complains of edema.  The location of the edema is face, ankle(s) bilateral, feet bilateral.   The edema has been moderate.   Onset of symptoms was 4 days ago, gradually worsening since that time.  The edema is present all day.  The patient states she has never experienced this before.   The swelling has been aggravated by recent chemothearpy, relieved by nothing, and been associated with shortness of breath and weight gain.  Cardiac risk factors include none.  ECOG FS:2 - Symptomatic, <50% confined to bed  Review of systems- Review of Systems  Constitutional: Negative.  Negative for chills, fever, malaise/fatigue and weight loss.  HENT: Negative for congestion and ear pain.    Eyes: Negative.  Negative for blurred vision and double vision.  Respiratory: Positive for cough, shortness of breath and wheezing. Negative for hemoptysis and sputum production.   Cardiovascular: Positive for leg swelling. Negative for chest pain and palpitations.  Gastrointestinal: Positive for nausea. Negative for abdominal pain, constipation, diarrhea and vomiting.  Genitourinary: Negative for dysuria, frequency, hematuria and urgency.  Musculoskeletal: Positive for back pain (chronic). Negative for falls.  Skin: Negative.  Negative for rash.  Neurological: Negative.  Negative for weakness and headaches.  Endo/Heme/Allergies: Negative.  Does not bruise/bleed easily.  Psychiatric/Behavioral: Negative.  Negative for depression. The patient is not nervous/anxious and does not have insomnia.      Current treatment- Daratumumab, Pomalyst and dexamethasone  Allergies  Allergen Reactions  . Codeine Nausea And Vomiting     Past Medical History:  Diagnosis Date  . Cancer ([place])   . GERD (gastroesophageal reflux disease)   . Hypertension   . Multiple myeloma [facility])      Past Surgical History:  Procedure Laterality Date  . BREAST BIOPSY Bilateral yrs ago   benign  . COLOSTOMY  2018  . COLOSTOMY REVERSAL     11 2018    Social History   Socioeconomic History  . Marital status: Married    Spouse name: Not on file  . Number of children: Not on file  . Years of education: Not on file  . Highest education level: Not on file  Occupational History  . Not on file  Social Needs  . Financial resource strain: Not on file  . Food insecurity:    Worry: Not on file    Inability: Not on file  .  Transportation needs:    Medical: Not on file    Non-medical: Not on file  Tobacco Use  . Smoking status: Former Research scientist (life sciences)  . Smokeless tobacco: Never Used  Substance and Sexual Activity  . Alcohol use: Not Currently  . Drug use: Never  . Sexual activity: Not on file  Lifestyle  .  Physical activity:    Days per week: Not on file    Minutes per session: Not on file  . Stress: Not on file  Relationships  . Social connections:    Talks on phone: Not on file    Gets together: Not on file    Attends religious service: Not on file    Active member of club or organization: Not on file    Attends meetings of clubs or organizations: Not on file    Relationship status: Not on file  . Intimate partner violence:    Fear of current or ex partner: Not on file    Emotionally abused: Not on file    Physically abused: Not on file    Forced sexual activity: Not on file  Other Topics Concern  . Not on file  Social History Narrative  . Not on file    Family History  Problem Relation Age of Onset  . Breast cancer Mother 64  . Cancer Brother 61       proastate      Current Outpatient Medications:  .  albuterol (VENTOLIN HFA) 108 (90 Base) MCG/ACT inhaler, Inhale 2 puffs into the lungs every 6 (six) hours as needed., Disp: 1 Inhaler, Rfl: 1 .  levofloxacin (LEVAQUIN) 500 MG tablet, Take 1 tablet (500 mg total) by mouth daily for 7 days., Disp: 7 tablet, Rfl: 0 .  magic mouthwash w/lidocaine SOLN, Take 5 mLs by mouth 3 (three) times daily., Disp: 240 mL, Rfl: 0 .  montelukast (SINGULAIR) 10 MG tablet, Take 1 tablet by mouth daily., Disp: , Rfl:  .  ondansetron (ZOFRAN) 8 MG tablet, Take 1 tablet by mouth every 8 (eight) hours as needed., Disp: , Rfl:  .  oxyCODONE (OXY IR/ROXICODONE) 5 MG immediate release tablet, Take 1 tablet by mouth 3 (three) times daily as needed., Disp: , Rfl:  .  oxyCODONE (OXYCONTIN) 10 mg 12 hr tablet, Take 1 tablet (10 mg total) by mouth every 12 (twelve) hours., Disp: 28 tablet, Rfl: 0 .  pantoprazole (PROTONIX) 40 MG tablet, Take 1 tablet by mouth every morning., Disp: , Rfl:  .  POMALYST 4 MG capsule, Take 1 tablet by mouth daily., Disp: , Rfl:  .  pravastatin (PRAVACHOL) 10 MG tablet, Take 1 tablet by mouth at bedtime., Disp: , Rfl:  .   spironolactone (ALDACTONE) 25 MG tablet, Take 1 tablet by mouth daily., Disp: , Rfl:   Physical exam:  Vitals:   01/14/18 1051  BP: (!) 146/78  Pulse: 60  Temp: 98 F (36.7 C)  SpO2: 96%  Weight: 135 lb 11.2 oz (61.6 kg)   Physical Exam  Constitutional: She is oriented to person, place, and time and well-developed, well-nourished, and in no distress. Vital signs are normal.  HENT:  Head: Normocephalic and atraumatic.  Eyes: Pupils are equal, round, and reactive to light.  Neck: Normal range of motion. No edema present.  Cardiovascular: Normal rate, regular rhythm, normal heart sounds and normal pulses.  No murmur heard. Pulmonary/Chest: Effort normal. She has wheezes in the right upper field. She has rhonchi in the right lower field and the left  lower field.  Abdominal: Soft. Normal appearance and bowel sounds are normal. She exhibits no distension. There is no tenderness. There is no rebound.  Musculoskeletal: Normal range of motion. She exhibits no edema.       Right ankle: She exhibits swelling.       Left ankle: She exhibits swelling.  Neurological: She is alert and oriented to person, place, and time. Gait normal.  Skin: Skin is warm, dry and intact. No rash noted.  Psychiatric: Mood, memory, affect and judgment normal.     CMP Latest Ref Rng & Units 01/14/2018  Glucose 65 - 99 mg/dL 105(H)  BUN 6 - 20 mg/dL 13  Creatinine 0.44 - 1.00 mg/dL 0.59  Sodium 135 - 145 mmol/L 135  Potassium 3.5 - 5.1 mmol/L 3.7  Chloride 101 - 111 mmol/L 103  CO2 22 - 32 mmol/L 24  Calcium 8.9 - 10.3 mg/dL 8.7(L)  Total Protein 6.5 - 8.1 g/dL 7.1  Total Bilirubin 0.3 - 1.2 mg/dL 1.2  Alkaline Phos 38 - 126 U/L 97  AST 15 - 41 U/L 16  ALT 14 - 54 U/L 17   CBC Latest Ref Rng & Units 01/14/2018  WBC 3.6 - 11.0 K/uL 2.9(L)  Hemoglobin 12.0 - 16.0 g/dL 12.5  Hematocrit 35.0 - 47.0 % 36.4  Platelets 150 - 440 K/uL 129(L)    No images are attached to the encounter.  Dg Chest 2  View  Result Date: 01/14/2018 CLINICAL DATA:  Cough and congestion. EXAM: CHEST - 2 VIEW COMPARISON:  Chest x-ray report 02/26/2015. FINDINGS: Mediastinum and hilar structures are normal. Cardiomegaly with normal pulmonary vascularity. COPD. Mild bibasilar atelectasis/infiltrates. Tiny bilateral pleural effusions may be present. Biapical pleural thickening noted consistent scarring. Carotid vascular calcification. Surgical clips right upper quadrant. IMPRESSION: 1. Mild bibasilar atelectasis/infiltrates. Tiny bilateral pleural effusions may be present. 2.  COPD.  Pleural-parenchymal scarring. 3.  Cardiomegaly.  No pulmonary venous congestion. 4.  Carotid vascular disease. Electronically Signed   By: Marcello Moores  Register   On: 01/14/2018 12:34   Nm Pulmonary Perf And Vent  Result Date: 01/14/2018 CLINICAL DATA:  Shortness of Breath EXAM: NUCLEAR MEDICINE VENTILATION - PERFUSION LUNG SCAN VIEWS: Anterior, posterior, right lateral, left lateral, RPO, LPO, RAO, LAO-ventilation and perfusion RADIOPHARMACEUTICALS:  32.56 mCi of Tc-69mDTPA aerosol inhalation and 4.03 mCi Tc942mAA IV COMPARISON:  Chest radiograph January 14, 2018 FINDINGS: Ventilation: There are multiple patchy subsegmental defects throughout the lungs bilaterally on the ventilation study. No well-defined segmental ventilation defect is evident. Perfusion: There are a few rather minimal subsegmental defects on the perfusion study. No segmental or major subsegmental perfusion defect identified. Perfusion defects are smaller than corresponding ventilation defects. There is no perfusion defect disproportionate to a ventilation defect. IMPRESSION: There are scattered small perfusion defects, none of which are segmental or significant subsegmental. There is no perfusion defect disproportionate to a ventilation defect. Several ventilation defects are larger than corresponding perfusion defects. This study constitutes a low probability of pulmonary embolus.  Electronically Signed   By: WiLowella GripII M.D.   On: 01/14/2018 19:00     Assessment and plan- Patient is a [age].o. female who presents for edema in bilateral feet and ankle, wheezing and crackles in lungs and shortness of breath.   1. Multiple Myeloma: S/p Cycle 2 of Daratumumab. Started Pomolyst this past Friday. Scheduled for cycle 3 of daratumumab on Friday, 01/15/2018.  This is likely to be canceled given patient's current condition.  Spoke to [employer]  chemotherapy pharmacist and she agrees that we should hold chemotherapy including Pomolyst until next Friday when she meets with Dr. Tasia Catchings. This will give the opportunity to recover.   2. Shortness of breath: Started X 4 days ago.  Accompanied with a cough with no sputum production.  She denies fevers.  Patient does not have any cardiac history on record.  Patient denies any cardiac history that she is aware of.  Will get chest x-ray and CT to rule out infection and/or pulmonary embolism.  Oxygen saturations okay. RX Levaquin 500 mg PO daily for 7 days. RX albuterol inhaler (2 puffs every 6 hours PRN).   Chest X-ray revealed mild bibasilar atelectasis/infiltrates. Tiny bilateral pleural effusions may be present.  Unable to get CTA due to poor venous access.  Got VQ sacn instead. Low probability of pulmonary embolus. D Dimer elevated. Could be from caner and chemo itself.  Labs reveal: Neuropenia (2.9). Thrombocytopenia 129. Elevated D dimer 3701. Blood cultures negative.   3. Edema: Patient states swelling began approximately 4 days ago.  Weight is up 2 pounds.  Bilateral pedal and ankle swelling present.  +2 pitting pedal edema.  Legs are nonswollen, tender or red.  Patient complains of facial swelling.  I personally cannot tell if her face is swollen.  Bilateral lower lobes rhonchus with rales. Wheezing present bilaterally. Elevate legs   4. Mouth Sores: Started yesterday. RX Magic mouthwash with lidocaine.    Visit Diagnosis 1.  Edema, unspecified type   2. Mouth sores   3. Shortness of breath     Patient expressed understanding and was in agreement with this plan. She also understands that She can call clinic at any time with any questions, concerns, or complaints.   Greater than 50% was spent in counseling and coordination of care with this patient including but not limited to discussion of the relevant topics above (See A&P) including, but not limited to diagnosis and management of acute and chronic medical conditions.    Faythe Casa, AGNP-C [facility] at [place]- [phone] Pager- [phone] 01/15/2018 4:06 PM

## 2018-01-14 NOTE — Telephone Encounter (Signed)
Can we bring her in please. I would like to evaluate her. Thanks

## 2018-01-14 NOTE — Progress Notes (Signed)
v

## 2018-01-14 NOTE — Telephone Encounter (Signed)
Received a call form patient that she has swelling of her face neck and legs and has some questions.  She had her first dose of Darzalex Friday and is scheduled for another one tomorrow. Please advise

## 2018-01-14 NOTE — Telephone Encounter (Signed)
Per caregiver,pt has a swollen lymph node in her neck which is sore, she is congested and has swelling of the face and neck. Appointment given for 10 AM and accepted

## 2018-01-15 ENCOUNTER — Inpatient Hospital Stay: Payer: Medicare PPO

## 2018-01-15 ENCOUNTER — Telehealth: Payer: Self-pay | Admitting: *Deleted

## 2018-01-15 ENCOUNTER — Telehealth: Payer: Self-pay | Admitting: Nurse Practitioner

## 2018-01-15 ENCOUNTER — Inpatient Hospital Stay: Payer: Medicare PPO | Admitting: Nurse Practitioner

## 2018-01-15 MED ORDER — ALBUTEROL SULFATE HFA 108 (90 BASE) MCG/ACT IN AERS: 2.0000 | INHALATION_SPRAY | Freq: Four times a day (QID) | RESPIRATORY_TRACT | 1 refills | Status: AC | PRN

## 2018-01-15 MED ORDER — MAGIC MOUTHWASH W/LIDOCAINE: 5.0000 mL | Freq: Three times a day (TID) | ORAL | 0 refills | Status: DC

## 2018-01-15 MED ORDER — LEVOFLOXACIN 500 MG PO TABS
500.0000 mg | ORAL_TABLET | Freq: Every day | ORAL | 0 refills | Status: DC
Start: 2018-01-15 — End: 2018-01-22

## 2018-01-15 NOTE — Telephone Encounter (Signed)
[name] took care of this. Thanks.

## 2018-01-15 NOTE — Telephone Encounter (Signed)
Spoke to Rulon Abide, NP regarding case. Per her request, orders entered for levaquin and magic mouthwash. Spoke to patient- told her scripts have been sent. Will also refill her albuterol inhaler. Discussed otc options for cough including mucinex and robitussin. Patient will call clinic next week. Currently has f/u on 4/2. If she is improving, we discussed that this can be cancelled and if not, we can see her for re-evaluation. All questions answered.

## 2018-01-15 NOTE — Progress Notes (Signed)
Blood cultures negatives. FYI

## 2018-01-15 NOTE — Telephone Encounter (Signed)
Patient's mother called to report that her script for mouthwash and abt were not sent to the pharmacy yester[facility].

## 2018-01-15 NOTE — Telephone Encounter (Signed)
I called in the MMW with lidocaine to pt's pharmacy per lauren Zenia Resides she told me [facility] [address].

## 2018-01-18 ENCOUNTER — Ambulatory Visit
Admission: RE | Admit: 2018-01-18 | Discharge: 2018-01-18 | Disposition: A | Discharge status: Home / Self Care | Payer: Medicare PPO | Source: Ambulatory Visit | Attending: Radiation Oncology | Admitting: Radiation Oncology

## 2018-01-18 DIAGNOSIS — C9002 Multiple myeloma in relapse: Secondary | ICD-10-CM | POA: Diagnosis not present

## 2018-01-18 DIAGNOSIS — Z510 Encounter for antineoplastic radiation therapy: Secondary | ICD-10-CM | POA: Diagnosis present

## 2018-01-19 ENCOUNTER — Inpatient Hospital Stay: Payer: Medicare PPO | Admitting: Nurse Practitioner

## 2018-01-19 ENCOUNTER — Ambulatory Visit
Admission: RE | Admit: 2018-01-19 | Discharge: 2018-01-19 | Disposition: A | Discharge status: Home / Self Care | Payer: Medicare PPO | Source: Ambulatory Visit | Attending: Radiation Oncology | Admitting: Radiation Oncology

## 2018-01-19 ENCOUNTER — Inpatient Hospital Stay: Payer: Medicare PPO

## 2018-01-19 DIAGNOSIS — Z510 Encounter for antineoplastic radiation therapy: Secondary | ICD-10-CM | POA: Diagnosis not present

## 2018-01-19 LAB — CULTURE, BLOOD (ROUTINE X 2)
CULTURE: NO GROWTH
Culture: NO GROWTH

## 2018-01-20 ENCOUNTER — Ambulatory Visit
Admission: RE | Admit: 2018-01-20 | Discharge: 2018-01-20 | Disposition: A | Discharge status: Home / Self Care | Payer: Medicare PPO | Source: Ambulatory Visit | Attending: Radiation Oncology | Admitting: Radiation Oncology

## 2018-01-20 DIAGNOSIS — Z510 Encounter for antineoplastic radiation therapy: Secondary | ICD-10-CM | POA: Diagnosis not present

## 2018-01-21 ENCOUNTER — Ambulatory Visit
Admission: RE | Admit: 2018-01-21 | Discharge: 2018-01-21 | Disposition: A | Discharge status: Home / Self Care | Payer: Medicare PPO | Source: Ambulatory Visit | Attending: Radiation Oncology | Admitting: Radiation Oncology

## 2018-01-21 DIAGNOSIS — Z510 Encounter for antineoplastic radiation therapy: Secondary | ICD-10-CM | POA: Diagnosis not present

## 2018-01-22 ENCOUNTER — Inpatient Hospital Stay (HOSPITAL_BASED_OUTPATIENT_CLINIC_OR_DEPARTMENT_OTHER): Payer: Medicare PPO | Admitting: Oncology

## 2018-01-22 ENCOUNTER — Inpatient Hospital Stay: Payer: Medicare PPO

## 2018-01-22 ENCOUNTER — Inpatient Hospital Stay: Payer: Medicare PPO | Attending: Oncology

## 2018-01-22 ENCOUNTER — Encounter: Payer: Self-pay | Admitting: Oncology

## 2018-01-22 ENCOUNTER — Ambulatory Visit
Admission: RE | Admit: 2018-01-22 | Discharge: 2018-01-22 | Disposition: A | Discharge status: Home / Self Care | Payer: Medicare PPO | Source: Ambulatory Visit | Attending: Radiation Oncology | Admitting: Radiation Oncology

## 2018-01-22 ENCOUNTER — Other Ambulatory Visit: Payer: Self-pay

## 2018-01-22 VITALS — BP 112/62 | HR 54 | Temp 97.4°F | Resp 18 | Wt 132.8 lb

## 2018-01-22 DIAGNOSIS — T451X5S Adverse effect of antineoplastic and immunosuppressive drugs, sequela: Secondary | ICD-10-CM | POA: Insufficient documentation

## 2018-01-22 DIAGNOSIS — J209 Acute bronchitis, unspecified: Secondary | ICD-10-CM | POA: Diagnosis not present

## 2018-01-22 DIAGNOSIS — I10 Essential (primary) hypertension: Secondary | ICD-10-CM | POA: Diagnosis not present

## 2018-01-22 DIAGNOSIS — R5383 Other fatigue: Secondary | ICD-10-CM

## 2018-01-22 DIAGNOSIS — Z87891 Personal history of nicotine dependence: Secondary | ICD-10-CM | POA: Insufficient documentation

## 2018-01-22 DIAGNOSIS — M7989 Other specified soft tissue disorders: Secondary | ICD-10-CM | POA: Insufficient documentation

## 2018-01-22 DIAGNOSIS — Z7982 Long term (current) use of aspirin: Secondary | ICD-10-CM | POA: Insufficient documentation

## 2018-01-22 DIAGNOSIS — Z79899 Other long term (current) drug therapy: Secondary | ICD-10-CM

## 2018-01-22 DIAGNOSIS — F419 Anxiety disorder, unspecified: Secondary | ICD-10-CM | POA: Insufficient documentation

## 2018-01-22 DIAGNOSIS — Z5111 Encounter for antineoplastic chemotherapy: Secondary | ICD-10-CM

## 2018-01-22 DIAGNOSIS — Z5112 Encounter for antineoplastic immunotherapy: Secondary | ICD-10-CM | POA: Insufficient documentation

## 2018-01-22 DIAGNOSIS — G8929 Other chronic pain: Secondary | ICD-10-CM | POA: Diagnosis not present

## 2018-01-22 DIAGNOSIS — C9002 Multiple myeloma in relapse: Secondary | ICD-10-CM | POA: Insufficient documentation

## 2018-01-22 DIAGNOSIS — Z792 Long term (current) use of antibiotics: Secondary | ICD-10-CM | POA: Insufficient documentation

## 2018-01-22 DIAGNOSIS — K219 Gastro-esophageal reflux disease without esophagitis: Secondary | ICD-10-CM | POA: Insufficient documentation

## 2018-01-22 DIAGNOSIS — R0602 Shortness of breath: Secondary | ICD-10-CM

## 2018-01-22 DIAGNOSIS — J441 Chronic obstructive pulmonary disease with (acute) exacerbation: Secondary | ICD-10-CM | POA: Diagnosis not present

## 2018-01-22 DIAGNOSIS — D701 Agranulocytosis secondary to cancer chemotherapy: Secondary | ICD-10-CM | POA: Diagnosis not present

## 2018-01-22 DIAGNOSIS — R5381 Other malaise: Secondary | ICD-10-CM | POA: Insufficient documentation

## 2018-01-22 DIAGNOSIS — Z885 Allergy status to narcotic agent status: Secondary | ICD-10-CM

## 2018-01-22 DIAGNOSIS — G629 Polyneuropathy, unspecified: Secondary | ICD-10-CM | POA: Diagnosis not present

## 2018-01-22 DIAGNOSIS — Z87311 Personal history of (healed) other pathological fracture: Secondary | ICD-10-CM | POA: Insufficient documentation

## 2018-01-22 DIAGNOSIS — R05 Cough: Secondary | ICD-10-CM | POA: Diagnosis not present

## 2018-01-22 DIAGNOSIS — R600 Localized edema: Secondary | ICD-10-CM | POA: Diagnosis not present

## 2018-01-22 DIAGNOSIS — C7951 Secondary malignant neoplasm of bone: Secondary | ICD-10-CM

## 2018-01-22 DIAGNOSIS — Z8042 Family history of malignant neoplasm of prostate: Secondary | ICD-10-CM | POA: Insufficient documentation

## 2018-01-22 DIAGNOSIS — M549 Dorsalgia, unspecified: Secondary | ICD-10-CM

## 2018-01-22 DIAGNOSIS — Z510 Encounter for antineoplastic radiation therapy: Secondary | ICD-10-CM | POA: Diagnosis not present

## 2018-01-22 DIAGNOSIS — E785 Hyperlipidemia, unspecified: Secondary | ICD-10-CM | POA: Insufficient documentation

## 2018-01-22 LAB — COMPREHENSIVE METABOLIC PANEL
ALK PHOS: 95 U/L (ref 38–126)
ALT: 13 U/L — AB (ref 14–54)
AST: 15 U/L (ref 15–41)
Albumin: 3.2 g/dL — ABNORMAL LOW (ref 3.5–5.0)
Anion gap: 7 (ref 5–15)
BUN: 11 mg/dL (ref 6–20)
CALCIUM: 8.7 mg/dL — AB (ref 8.9–10.3)
CO2: 24 mmol/L (ref 22–32)
CREATININE: 0.69 mg/dL (ref 0.44–1.00)
Chloride: 106 mmol/L (ref 101–111)
GFR calc Af Amer: >60 mL/min (ref >60)
GFR calc non Af Amer: >60 mL/min (ref >60)
GLUCOSE: 103 mg/dL — AB (ref 65–99)
Potassium: 4.3 mmol/L (ref 3.5–5.1)
SODIUM: 137 mmol/L (ref 135–145)
Total Bilirubin: 1 mg/dL (ref 0.3–1.2)
Total Protein: 6.8 g/dL (ref 6.5–8.1)

## 2018-01-22 LAB — CBC WITH DIFFERENTIAL/PLATELET
Basophils Absolute: 0 10*3/uL (ref 0–0.1)
Basophils Relative: 1 %
EOS ABS: 0 10*3/uL (ref 0–0.7)
EOS PCT: 2 %
HCT: 36.1 % (ref 35.0–47.0)
HEMOGLOBIN: 12.5 g/dL (ref 12.0–16.0)
Lymphocytes Relative: 27 %
Lymphs Abs: 0.6 10*3/uL — ABNORMAL LOW (ref 1.0–3.6)
MCH: 30.9 pg (ref 26.0–34.0)
MCHC: 34.5 g/dL (ref 32.0–36.0)
MCV: 89.6 fL (ref 80.0–100.0)
MONO ABS: 0.7 10*3/uL (ref 0.2–0.9)
MONOS PCT: 28 %
Neutro Abs: 1 10*3/uL — ABNORMAL LOW (ref 1.4–6.5)
Neutrophils Relative %: 42 %
PLATELETS: 115 10*3/uL — AB (ref 150–440)
RBC: 4.03 MIL/uL (ref 3.80–5.20)
RDW: 14.2 % (ref 11.5–14.5)
WBC: 2.4 10*3/uL — ABNORMAL LOW (ref 3.6–11.0)

## 2018-01-22 MED ORDER — FLUTICASONE PROPIONATE (INHAL) 50 MCG/BLIST IN AEPB: 1.0000 | INHALATION_SPRAY | Freq: Two times a day (BID) | RESPIRATORY_TRACT | 3 refills | Status: AC

## 2018-01-22 MED ORDER — ACETAMINOPHEN 325 MG PO TABS
650.0000 mg | ORAL_TABLET | Freq: Once | ORAL | Status: AC
  Administered 2018-01-22: 650 mg via ORAL
  Filled 2018-01-22: qty 2

## 2018-01-22 MED ORDER — DIPHENHYDRAMINE HCL 25 MG PO CAPS
50.0000 mg | ORAL_CAPSULE | Freq: Once | ORAL | Status: AC
  Administered 2018-01-22: 50 mg via ORAL
  Filled 2018-01-22: qty 2

## 2018-01-22 MED ORDER — DARATUMUMAB CHEMO INJECTION 400 MG/20ML
900.0000 mg | Freq: Once | INTRAVENOUS | Status: AC
  Administered 2018-01-22: 900 mg via INTRAVENOUS
  Filled 2018-01-22: qty 40

## 2018-01-22 MED ORDER — PROCHLORPERAZINE MALEATE 10 MG PO TABS
10.0000 mg | ORAL_TABLET | Freq: Once | ORAL | Status: AC
  Administered 2018-01-22: 10 mg via ORAL
  Filled 2018-01-22: qty 1

## 2018-01-22 MED ORDER — SODIUM CHLORIDE 0.9 % IV SOLN
Freq: Once | INTRAVENOUS | Status: AC
  Administered 2018-01-22: 11:00:00 via INTRAVENOUS
  Filled 2018-01-22: qty 1000

## 2018-01-22 MED ORDER — SODIUM CHLORIDE 0.9 % IV SOLN
20.0000 mg | Freq: Once | INTRAVENOUS | Status: AC
  Administered 2018-01-22: 20 mg via INTRAVENOUS
  Filled 2018-01-22: qty 2

## 2018-01-22 NOTE — Progress Notes (Signed)
Here for follow up  Stated still having back pain p 5 radiation tx.

## 2018-01-22 NOTE — Progress Notes (Signed)
Hematology/Oncology Follow up note [name] Telephone:([phone] [phone]   Patient Care Team: Patient, No Pcp Per as PCP - General (General Practice)  REFERRING PROVIDER: Dr. Baruch Gouty, Donette  REASON FOR VISIT Follow up for treatment of MM  HISTORY OF PRESENTING ILLNESS:  Cassie Jones is a  72 y.o.  female with PMH listed below who was referred to me for evaluation of multiple myeloma.  Patient used to follow-up with local oncologist Dr. Tonia Brooms and [facility] oncologist Dr. Amalia Hailey.  Patient tells me that as her husband works every day and has a busy working schedule, she is not able to get transportation to her treatment.  As an alternative she is now living with her brother who is close to [facility] regional [place] and want to transfer care here. Extensive medical record review was performed.    Patient was diagnosed with IgG lambda, ISS2, t (4,14) multiple myeloma on February 03, 2017, she has hemoglobin of 9, calcium 8.9, LDH 132, beta microglobulin 4.6, albumin 3.2.  Skeletal survey negative.  PET scan with single nonspecific humeral lesion and a thyroid nodule.  Marrow with 60% plasmacytosis by CD138 IHC, FISH with hyperdiploidy t(4, 14), 1 q. gain.. Per note, she was treated with CyborD (M spike 3.6 at start)  from 01/2017 to 02/2017. Her treatment was switched to Revlimid Velcade dexamethasone from 02/2017, M spike 1.2 at start,and was found to have progression of disease in 09/2017, when her M spike increased to 1.8, and developed compression fracture in 10/2016. She was restarted on RVD using weekly Velcade.  She was recently seen by Dr. Boris Sharper and recommended to start on a combination of daratumumab, Pomalyst, dexamethasone. Per patient she received her first daratumumab last week with split dose '8mg'$ /kg given on 3/14 and 3/15.  Patient reports that she tolerate the treatment well except mild infusion reaction.  Patient also reports  that she has got dental clearance and has been given 1 dose of Zometa by Dr. Remus Loffler (unkown dates).  Autologous transplant has been discussed with patient at [facility] and patient is still in the process of deciding.  Current Treatment # 3/14, 3/15 at outside facility, she got split dose of daratumumab. #  01/08/2018 Start on weekly daratumumab '16mg'$ /m2, dexamethasone '20mg'$ ,  01/08/2018 Pomalyst '4mg'$  Day 1-21 ogf 28 day cycle.  # Palliative RT to spine.   INTERVAL HISTORY Cassie Jones is a 72 y.o. female who has above history reviewed by me today presents for follow up visit for management of multiple myeloma. She is getting palliative RT to spine and pain is better controlled.  Patient was seen by nurse practitioner last week prior to her cycle 2 daratumumab.  She reports feeling shortness of breath, having cough, believed to have bronchitis and was prescribed Levaquin 500 mg p.o. daily for 7 days, and albuterol as needed.  She also had a VQ scan low probability for pulmonary embolism. Patient reports cough is better.  Denies any fever or chills.  Daratumumab was held last week.  Patient was also instructed by a nurse practitioner to hold Pomalyst due to the acute bronchitis episode however patient continued to take Pomalyst as scheduled. Reports feeling better today.  Cough is better.  Feels nervous and anxious.   Review of Systems  Constitutional: Positive for malaise/fatigue. Negative for chills, fever and weight loss.  HENT: Negative for congestion, ear discharge, hearing loss and nosebleeds.   Eyes: Negative for blurred vision, photophobia and pain.  Respiratory:  Negative for cough, sputum production and shortness of breath.   Cardiovascular: Negative for chest pain, palpitations, orthopnea and claudication.  Gastrointestinal: Negative for heartburn, nausea and vomiting.  Genitourinary: Negative for dysuria, frequency and urgency.  Musculoskeletal: Positive for back pain.  Skin: Negative  for rash.  Neurological: Negative for dizziness, tremors, sensory change and weakness.  Endo/Heme/Allergies: Does not bruise/bleed easily.  Psychiatric/Behavioral: Negative for depression, hallucinations and substance abuse.    MEDICAL HISTORY:  Past Medical History:  Diagnosis Date  . Cancer ([place])   . GERD (gastroesophageal reflux disease)   . Hypertension   . Multiple myeloma ([place])     SURGICAL HISTORY: Past Surgical History:  Procedure Laterality Date  . BREAST BIOPSY Bilateral yrs ago   benign  . COLOSTOMY  2018  . COLOSTOMY REVERSAL     11 2018    SOCIAL HISTORY: Social History   Socioeconomic History  . Marital status: Married    Spouse name: Not on file  . Number of children: Not on file  . Years of education: Not on file  . Highest education level: Not on file  Occupational History  . Not on file  Social Needs  . Financial resource strain: Not on file  . Food insecurity:    Worry: Not on file    Inability: Not on file  . Transportation needs:    Medical: Not on file    Non-medical: Not on file  Tobacco Use  . Smoking status: Former Research scientist (life sciences)  . Smokeless tobacco: Never Used  Substance and Sexual Activity  . Alcohol use: Not Currently  . Drug use: Never  . Sexual activity: Not on file  Lifestyle  . Physical activity:    Days per week: Not on file    Minutes per session: Not on file  . Stress: Not on file  Relationships  . Social connections:    Talks on phone: Not on file    Gets together: Not on file    Attends religious service: Not on file    Active member of club or organization: Not on file    Attends meetings of clubs or organizations: Not on file    Relationship status: Not on file  . Intimate partner violence:    Fear of current or ex partner: Not on file    Emotionally abused: Not on file    Physically abused: Not on file    Forced sexual activity: Not on file  Other Topics Concern  . Not on file  Social History Narrative  . Not on  file    FAMILY HISTORY: Family History  Problem Relation Age of Onset  . Breast cancer Mother 69  . Cancer Brother 72       proastate     ALLERGIES:  is allergic to codeine.  MEDICATIONS:  Current Outpatient Medications  Medication Sig Dispense Refill  . albuterol (VENTOLIN HFA) 108 (90 Base) MCG/ACT inhaler Inhale 2 puffs into the lungs every 6 (six) hours as needed. 1 Inhaler 1  . levofloxacin (LEVAQUIN) 500 MG tablet Take 1 tablet (500 mg total) by mouth daily for 7 days. 7 tablet 0  . magic mouthwash w/lidocaine SOLN Take 5 mLs by mouth 3 (three) times daily. 240 mL 0  . montelukast (SINGULAIR) 10 MG tablet Take 1 tablet by mouth daily.    . ondansetron (ZOFRAN) 8 MG tablet Take 1 tablet by mouth every 8 (eight) hours as needed.    Marland Kitchen oxyCODONE (OXY IR/ROXICODONE) 5 MG immediate  release tablet Take 1 tablet by mouth 3 (three) times daily as needed.    Marland Kitchen oxyCODONE (OXYCONTIN) 10 mg 12 hr tablet Take 1 tablet (10 mg total) by mouth every 12 (twelve) hours. 28 tablet 0  . pantoprazole (PROTONIX) 40 MG tablet Take 1 tablet by mouth every morning.    Marland Kitchen POMALYST 4 MG capsule Take 1 tablet by mouth daily.    . pravastatin (PRAVACHOL) 10 MG tablet Take 1 tablet by mouth at bedtime.    Marland Kitchen spironolactone (ALDACTONE) 25 MG tablet Take 1 tablet by mouth daily.     No current facility-administered medications for this visit.      PHYSICAL EXAMINATION: ECOG PERFORMANCE STATUS: 1 - Symptomatic but completely ambulatory Vitals:   01/22/18 0904  BP: 112/62  Pulse: (!) 54  Resp: 18  Temp: (!) 97.4 F (36.3 C)   Filed Weights   01/22/18 0904  Weight: 132 lb 12.8 oz (60.2 kg)    Physical Exam  Constitutional: She is oriented to person, place, and time and well-developed, well-nourished, and in no distress. No distress.  HENT:  Head: Normocephalic and atraumatic.  Mouth/Throat: No oropharyngeal exudate.  Eyes: Pupils are equal, round, and reactive to light. EOM are normal. Left eye  exhibits no discharge. No scleral icterus.  Neck: Normal range of motion. Neck supple. No JVD present.  Cardiovascular: Normal rate and regular rhythm.  No murmur heard. Pulmonary/Chest: Effort normal. No respiratory distress.  Abdominal: Soft. She exhibits no distension. There is no tenderness. There is no rebound.  Musculoskeletal: Normal range of motion. She exhibits no edema.  Lymphadenopathy:    She has no cervical adenopathy.  Neurological: She is alert and oriented to person, place, and time. No cranial nerve deficit.  Skin: Skin is warm and dry. No erythema.  Psychiatric: Affect and judgment normal.     LABORATORY DATA:  I have reviewed the data as listed Lab Results  Component Value Date   WBC 2.9 (L) 01/14/2018   HGB 12.5 01/14/2018   HCT 36.4 01/14/2018   MCV 90.8 01/14/2018   PLT 129 (L) 01/14/2018   Recent Labs    01/04/18 1308 01/08/18 0902 01/14/18 1115  NA 136 136 135  K 3.9 3.9 3.7  CL 105 105 103  CO2 '24 24 24  '$ GLUCOSE 138* 107* 105*  BUN '13 13 13  '$ CREATININE 0.64 0.71 0.59  CALCIUM 8.4* 8.7* 8.7*  GFRNONAA >60 >60 >60  GFRAA >60 >60 >60  PROT 7.9 7.3 7.1  ALBUMIN 3.4* 3.2* 3.1*  AST '19 15 16  '$ ALT '17 15 17  '$ ALKPHOS 85 73 97  BILITOT 1.2 1.1 1.2       ASSESSMENT & PLAN:  1. Multiple myeloma in relapse ([place])   2. Bone metastasis ([place])   3. Encounter for antineoplastic chemotherapy   4. Encounter for antineoplastic immunotherapy   5. Shortness of breath    #Multiple myeloma : Okay to proceed with cycle 3 daratumumab  plan daratumumab 16 mg/m2 weekly for total of 8 weeks, following by Q2 weeks from week 9-24, followed by monthly from week 25 and beyond.  Counts are acceptable, continue Pomalyst D1-21 every 28 days.   She has singulair and brought her bottle to clinic and will take prior to infusion, she gets dexamethasone '20mg'$  IV as premedication. #Acute bronchitis: Improved.  Finished antibiotics course.  Patient is a former smoker likely  have underlying COPD.  Encourage patient to continue use albuterol inhaler every 6 hours as  needed, and now sent Flovent inhaler twice daily.  # Follow up with Radonc for palliative radiation.  # Neuropathy: continue gabapentin.  # # Chronic back pain: continue oxycodone '5mg'$  Q8h as needed.  All questions were answered. The patient knows to call the clinic with any problems questions or concerns.  Return of visit:1 weeks for Daratumumab and IV Dexamethasone.    Earlie Server, MD, PhD Hematology Oncology [facility] Pager- [phone] 01/22/2018

## 2018-01-22 NOTE — Progress Notes (Signed)
ANC is 1, per RN, MD ok to proceed with treatment today.

## 2018-01-25 ENCOUNTER — Ambulatory Visit
Admission: RE | Admit: 2018-01-25 | Discharge: 2018-01-25 | Disposition: A | Discharge status: Home / Self Care | Payer: Medicare PPO | Source: Ambulatory Visit | Attending: Radiation Oncology | Admitting: Radiation Oncology

## 2018-01-25 DIAGNOSIS — Z510 Encounter for antineoplastic radiation therapy: Secondary | ICD-10-CM | POA: Diagnosis not present

## 2018-01-26 ENCOUNTER — Ambulatory Visit
Admission: RE | Admit: 2018-01-26 | Discharge: 2018-01-26 | Disposition: A | Discharge status: Home / Self Care | Payer: Medicare PPO | Source: Ambulatory Visit | Attending: Radiation Oncology | Admitting: Radiation Oncology

## 2018-01-26 DIAGNOSIS — Z510 Encounter for antineoplastic radiation therapy: Secondary | ICD-10-CM | POA: Diagnosis not present

## 2018-01-27 ENCOUNTER — Telehealth: Payer: Self-pay | Admitting: *Deleted

## 2018-01-27 ENCOUNTER — Ambulatory Visit
Admission: RE | Admit: 2018-01-27 | Discharge: 2018-01-27 | Disposition: A | Discharge status: Home / Self Care | Payer: Medicare PPO | Source: Ambulatory Visit | Attending: Radiation Oncology | Admitting: Radiation Oncology

## 2018-01-27 DIAGNOSIS — Z510 Encounter for antineoplastic radiation therapy: Secondary | ICD-10-CM | POA: Diagnosis not present

## 2018-01-27 NOTE — Telephone Encounter (Signed)
Left vm for pt to return my call.  Need to confirm address, as the patient is staying with her sister temporarily and will need to be delivered there

## 2018-01-27 NOTE — Telephone Encounter (Signed)
Call from [facility] asking for refill of Pomalyst and Dexamethasone

## 2018-01-28 ENCOUNTER — Ambulatory Visit: Payer: Medicare PPO | Admitting: Internal Medicine

## 2018-01-28 ENCOUNTER — Ambulatory Visit
Admission: RE | Admit: 2018-01-28 | Discharge: 2018-01-28 | Disposition: A | Discharge status: Home / Self Care | Payer: Medicare PPO | Source: Ambulatory Visit | Attending: Radiation Oncology | Admitting: Radiation Oncology

## 2018-01-28 ENCOUNTER — Encounter: Payer: Self-pay | Admitting: Internal Medicine

## 2018-01-28 ENCOUNTER — Other Ambulatory Visit: Payer: Self-pay | Admitting: *Deleted

## 2018-01-28 VITALS — BP 120/60 | HR 64 | Temp 98.3°F | Ht 62.0 in | Wt 136.2 lb

## 2018-01-28 DIAGNOSIS — M545 Low back pain: Secondary | ICD-10-CM | POA: Diagnosis not present

## 2018-01-28 DIAGNOSIS — H018 Other specified inflammations of eyelid: Secondary | ICD-10-CM | POA: Diagnosis not present

## 2018-01-28 DIAGNOSIS — G629 Polyneuropathy, unspecified: Secondary | ICD-10-CM | POA: Diagnosis not present

## 2018-01-28 DIAGNOSIS — R600 Localized edema: Secondary | ICD-10-CM | POA: Diagnosis not present

## 2018-01-28 DIAGNOSIS — I10 Essential (primary) hypertension: Secondary | ICD-10-CM

## 2018-01-28 DIAGNOSIS — G8929 Other chronic pain: Secondary | ICD-10-CM

## 2018-01-28 DIAGNOSIS — Z1231 Encounter for screening mammogram for malignant neoplasm of breast: Secondary | ICD-10-CM | POA: Diagnosis not present

## 2018-01-28 DIAGNOSIS — Z510 Encounter for antineoplastic radiation therapy: Secondary | ICD-10-CM | POA: Diagnosis not present

## 2018-01-28 DIAGNOSIS — E559 Vitamin D deficiency, unspecified: Secondary | ICD-10-CM | POA: Diagnosis not present

## 2018-01-28 MED ORDER — POMALYST 4 MG PO CAPS: ORAL_CAPSULE | ORAL | 0 refills | Status: DC

## 2018-01-28 MED ORDER — SPIRONOLACTONE 25 MG PO TABS: 25.0000 mg | ORAL_TABLET | Freq: Every day | ORAL | 1 refills | Status: DC

## 2018-01-28 MED ORDER — FUROSEMIDE 20 MG PO TABS: 20.0000 mg | ORAL_TABLET | Freq: Every day | ORAL | 2 refills | Status: AC | PRN

## 2018-01-28 MED ORDER — ERYTHROMYCIN 5 MG/GM OP OINT: 1.0000 "application " | TOPICAL_OINTMENT | Freq: Three times a day (TID) | OPHTHALMIC | 0 refills | Status: DC

## 2018-01-28 NOTE — Progress Notes (Signed)
Pre visit review using our clinic review tool, if applicable. No additional management support is needed unless otherwise documented below in the visit note. 

## 2018-01-28 NOTE — Progress Notes (Signed)
She is scheduled on 4/15 @ 11, she is aware

## 2018-01-28 NOTE — Patient Instructions (Signed)
Use Flonase nasal spray 2 sprays once daily  Try Allegra or Zyrtec or Claritin at night  Please f/u in 2 weeks sooner if needed    Edema Edema is an abnormal buildup of fluids in your bodytissues. Edema is somewhatdependent on gravity to pull the fluid to the lowest place in your body. That makes the condition more common in the legs and thighs (lower extremities). Painless swelling of the feet and ankles is common and becomes more likely as you get older. It is also common in looser tissues, like around your eyes. When the affected area is squeezed, the fluid may move out of that spot and leave a dent for a few moments. This dent is called pitting. What are the causes? There are many possible causes of edema. Eating too much salt and being on your feet or sitting for a long time can cause edema in your legs and ankles. Hot weather may make edema worse. Common medical causes of edema include:  Heart failure.  Liver disease.  Kidney disease.  Weak blood vessels in your legs.  Cancer.  An injury.  Pregnancy.  Some medications.  Obesity.  What are the signs or symptoms? Edema is usually painless.Your skin may look swollen or shiny. How is this diagnosed? Your health care provider may be able to diagnose edema by asking about your medical history and doing a physical exam. You may need to have tests such as X-rays, an electrocardiogram, or blood tests to check for medical conditions that may cause edema. How is this treated? Edema treatment depends on the cause. If you have heart, liver, or kidney disease, you need the treatment appropriate for these conditions. General treatment may include:  Elevation of the affected body part above the level of your heart.  Compression of the affected body part. Pressure from elastic bandages or support stockings squeezes the tissues and forces fluid back into the blood vessels. This keeps fluid from entering the tissues.  Restriction of  fluid and salt intake.  Use of a water pill (diuretic). These medications are appropriate only for some types of edema. They pull fluid out of your body and make you urinate more often. This gets rid of fluid and reduces swelling, but diuretics can have side effects. Only use diuretics as directed by your health care provider.  Follow these instructions at home:  Keep the affected body part above the level of your heart when you are lying down.  Do not sit still or stand for prolonged periods.  Do not put anything directly under your knees when lying down.  Do not wear constricting clothing or garters on your upper legs.  Exercise your legs to work the fluid back into your blood vessels. This may help the swelling go down.  Wear elastic bandages or support stockings to reduce ankle swelling as directed by your health care provider.  Eat a low-salt diet to reduce fluid if your health care provider recommends it.  Only take medicines as directed by your health care provider. Contact a health care provider if:  Your edema is not responding to treatment.  You have heart, liver, or kidney disease and notice symptoms of edema.  You have edema in your legs that does not improve after elevating them.  You have sudden and unexplained weight gain. Get help right away if:  You develop shortness of breath or chest pain.  You cannot breathe when you lie down.  You develop pain, redness, or warmth in  the swollen areas.  You have heart, liver, or kidney disease and suddenly get edema.  You have a fever and your symptoms suddenly get worse. This information is not intended to replace advice given to you by your health care provider. Make sure you discuss any questions you have with your health care provider. Document Released: 10/06/2005 Document Revised: 03/13/2016 Document Reviewed: 07/29/2013 Elsevier Interactive Patient Education  2017 [employer].

## 2018-01-29 ENCOUNTER — Inpatient Hospital Stay: Payer: Medicare PPO

## 2018-01-29 ENCOUNTER — Ambulatory Visit
Admission: RE | Admit: 2018-01-29 | Discharge: 2018-01-29 | Disposition: A | Discharge status: Home / Self Care | Payer: Medicare PPO | Source: Ambulatory Visit | Attending: Oncology | Admitting: Oncology

## 2018-01-29 ENCOUNTER — Encounter: Payer: Self-pay | Admitting: Oncology

## 2018-01-29 ENCOUNTER — Ambulatory Visit
Admission: RE | Admit: 2018-01-29 | Discharge: 2018-01-29 | Disposition: A | Discharge status: Home / Self Care | Payer: Medicare PPO | Source: Ambulatory Visit | Attending: Radiation Oncology | Admitting: Radiation Oncology

## 2018-01-29 ENCOUNTER — Inpatient Hospital Stay (HOSPITAL_BASED_OUTPATIENT_CLINIC_OR_DEPARTMENT_OTHER): Payer: Medicare PPO | Admitting: Oncology

## 2018-01-29 ENCOUNTER — Other Ambulatory Visit: Payer: Self-pay

## 2018-01-29 VITALS — BP 121/72 | HR 58 | Temp 98.0°F | Wt 134.2 lb

## 2018-01-29 DIAGNOSIS — Z7982 Long term (current) use of aspirin: Secondary | ICD-10-CM

## 2018-01-29 DIAGNOSIS — Z5111 Encounter for antineoplastic chemotherapy: Secondary | ICD-10-CM

## 2018-01-29 DIAGNOSIS — R600 Localized edema: Secondary | ICD-10-CM

## 2018-01-29 DIAGNOSIS — Z5112 Encounter for antineoplastic immunotherapy: Secondary | ICD-10-CM

## 2018-01-29 DIAGNOSIS — R5381 Other malaise: Secondary | ICD-10-CM

## 2018-01-29 DIAGNOSIS — G8929 Other chronic pain: Secondary | ICD-10-CM | POA: Diagnosis not present

## 2018-01-29 DIAGNOSIS — C9002 Multiple myeloma in relapse: Secondary | ICD-10-CM

## 2018-01-29 DIAGNOSIS — I10 Essential (primary) hypertension: Secondary | ICD-10-CM

## 2018-01-29 DIAGNOSIS — M549 Dorsalgia, unspecified: Secondary | ICD-10-CM

## 2018-01-29 DIAGNOSIS — J209 Acute bronchitis, unspecified: Secondary | ICD-10-CM

## 2018-01-29 DIAGNOSIS — Z885 Allergy status to narcotic agent status: Secondary | ICD-10-CM

## 2018-01-29 DIAGNOSIS — M7989 Other specified soft tissue disorders: Secondary | ICD-10-CM

## 2018-01-29 DIAGNOSIS — K219 Gastro-esophageal reflux disease without esophagitis: Secondary | ICD-10-CM | POA: Diagnosis not present

## 2018-01-29 DIAGNOSIS — C7951 Secondary malignant neoplasm of bone: Secondary | ICD-10-CM | POA: Diagnosis not present

## 2018-01-29 DIAGNOSIS — F419 Anxiety disorder, unspecified: Secondary | ICD-10-CM | POA: Diagnosis not present

## 2018-01-29 DIAGNOSIS — R5383 Other fatigue: Secondary | ICD-10-CM | POA: Diagnosis not present

## 2018-01-29 DIAGNOSIS — M545 Low back pain: Secondary | ICD-10-CM

## 2018-01-29 DIAGNOSIS — G629 Polyneuropathy, unspecified: Secondary | ICD-10-CM | POA: Diagnosis not present

## 2018-01-29 DIAGNOSIS — Z87891 Personal history of nicotine dependence: Secondary | ICD-10-CM

## 2018-01-29 DIAGNOSIS — Z510 Encounter for antineoplastic radiation therapy: Secondary | ICD-10-CM | POA: Diagnosis not present

## 2018-01-29 DIAGNOSIS — Z79899 Other long term (current) drug therapy: Secondary | ICD-10-CM

## 2018-01-29 LAB — CBC WITH DIFFERENTIAL/PLATELET
Basophils Absolute: 0 10*3/uL (ref 0–0.1)
Basophils Relative: 1 %
EOS ABS: 0 10*3/uL (ref 0–0.7)
EOS PCT: 2 %
HCT: 36.6 % (ref 35.0–47.0)
Hemoglobin: 12.5 g/dL (ref 12.0–16.0)
Lymphocytes Relative: 29 %
Lymphs Abs: 0.6 10*3/uL — ABNORMAL LOW (ref 1.0–3.6)
MCH: 30.4 pg (ref 26.0–34.0)
MCHC: 34.3 g/dL (ref 32.0–36.0)
MCV: 88.8 fL (ref 80.0–100.0)
MONO ABS: 0.9 10*3/uL (ref 0.2–0.9)
Monocytes Relative: 38 %
Neutro Abs: 0.7 10*3/uL — ABNORMAL LOW (ref 1.4–6.5)
Neutrophils Relative %: 30 %
PLATELETS: 156 10*3/uL (ref 150–440)
RBC: 4.12 MIL/uL (ref 3.80–5.20)
RDW: 14.8 % — AB (ref 11.5–14.5)
WBC: 2.2 10*3/uL — AB (ref 3.6–11.0)

## 2018-01-29 LAB — COMPREHENSIVE METABOLIC PANEL
ALT: 13 U/L — AB (ref 14–54)
AST: 14 U/L — AB (ref 15–41)
Albumin: 3.1 g/dL — ABNORMAL LOW (ref 3.5–5.0)
Alkaline Phosphatase: 78 U/L (ref 38–126)
Anion gap: 7 (ref 5–15)
BILIRUBIN TOTAL: 1.2 mg/dL (ref 0.3–1.2)
BUN: 11 mg/dL (ref 6–20)
CO2: 23 mmol/L (ref 22–32)
CREATININE: 0.52 mg/dL (ref 0.44–1.00)
Calcium: 8.4 mg/dL — ABNORMAL LOW (ref 8.9–10.3)
Chloride: 107 mmol/L (ref 101–111)
GFR calc Af Amer: >60 mL/min (ref >60)
GLUCOSE: 97 mg/dL (ref 65–99)
Potassium: 3.8 mmol/L (ref 3.5–5.1)
Sodium: 137 mmol/L (ref 135–145)
TOTAL PROTEIN: 6.3 g/dL — AB (ref 6.5–8.1)

## 2018-01-29 MED ORDER — ACETAMINOPHEN 325 MG PO TABS
650.0000 mg | ORAL_TABLET | Freq: Once | ORAL | Status: AC
  Administered 2018-01-29: 650 mg via ORAL
  Filled 2018-01-29: qty 2

## 2018-01-29 MED ORDER — DIPHENHYDRAMINE HCL 25 MG PO CAPS
50.0000 mg | ORAL_CAPSULE | Freq: Once | ORAL | Status: AC
  Administered 2018-01-29: 50 mg via ORAL
  Filled 2018-01-29: qty 2

## 2018-01-29 MED ORDER — SODIUM CHLORIDE 0.9 % IV SOLN: 16.0000 mg/kg | Freq: Once | INTRAVENOUS | Status: DC

## 2018-01-29 MED ORDER — PROCHLORPERAZINE MALEATE 10 MG PO TABS
10.0000 mg | ORAL_TABLET | Freq: Once | ORAL | Status: AC
  Administered 2018-01-29: 10 mg via ORAL
  Filled 2018-01-29: qty 1

## 2018-01-29 MED ORDER — SODIUM CHLORIDE 0.9 % IV SOLN
Freq: Once | INTRAVENOUS | Status: AC
  Administered 2018-01-29: 12:00:00 via INTRAVENOUS
  Filled 2018-01-29: qty 1000

## 2018-01-29 MED ORDER — SODIUM CHLORIDE 0.9 % IV SOLN
900.0000 mg | Freq: Once | INTRAVENOUS | Status: AC
  Administered 2018-01-29: 900 mg via INTRAVENOUS
  Filled 2018-01-29: qty 40

## 2018-01-29 MED ORDER — ASPIRIN EC 81 MG PO TBEC: 81.0000 mg | DELAYED_RELEASE_TABLET | Freq: Every day | ORAL | 3 refills | Status: DC

## 2018-01-29 MED ORDER — SODIUM CHLORIDE 0.9 % IV SOLN: 940.0000 mg | Freq: Once | INTRAVENOUS | Status: DC

## 2018-01-29 MED ORDER — SODIUM CHLORIDE 0.9 % IV SOLN
20.0000 mg | Freq: Once | INTRAVENOUS | Status: AC
  Administered 2018-01-29: 20 mg via INTRAVENOUS
  Filled 2018-01-29: qty 2

## 2018-01-29 NOTE — Progress Notes (Signed)
Hematology/Oncology Follow up note [facility] Telephone:(336) [phone] Fax:([phone]) [phone]   Patient Care Team: McLean-Scocuzza, Nino Glow, MD as PCP - General (Internal Medicine)  REFERRING PROVIDER: Dr. Baruch Gouty, Donette  REASON FOR VISIT Follow up for treatment of MM  HISTORY OF PRESENTING ILLNESS:  Cassie Jones is a  72 y.o.  female with PMH listed below who was referred to me for evaluation of multiple myeloma.  Patient used to follow-up with local oncologist Dr. Tonia Brooms and [facility] oncologist Dr. Amalia Hailey.  Patient tells me that as her husband works every day and has a busy working schedule, she is not able to get transportation to her treatment.  As an alternative she is now living with her brother who is close to [facility] [place] and want to transfer care here. Extensive medical record review was performed.    Patient was diagnosed with IgG lambda, ISS2, t (4,14) multiple myeloma on February 03, 2017, she has hemoglobin of 9, calcium 8.9, LDH 132, beta microglobulin 4.6, albumin 3.2.  Skeletal survey negative.  PET scan with single nonspecific humeral lesion and a thyroid nodule.  Marrow with 60% plasmacytosis by CD138 IHC, FISH with hyperdiploidy t(4, 14), 1 q. gain.. Per note, she was treated with CyborD (M spike 3.6 at start)  from 01/2017 to 02/2017. Her treatment was switched to Revlimid Velcade dexamethasone from 02/2017, M spike 1.2 at start,and was found to have progression of disease in 09/2017, when her M spike increased to 1.8, and developed compression fracture in 10/2016. She was restarted on RVD using weekly Velcade.  She was recently seen by Dr. Boris Sharper and recommended to start on a combination of daratumumab, Pomalyst, dexamethasone. Per patient she received her first daratumumab last week with split dose 32m/kg given on 3/14 and 3/15.  Patient reports that she tolerate the treatment well except mild infusion reaction.  # Patient  also reports that she has got dental clearance and has been given 1 dose of Zometa by Dr. VRemus Loffler(unkown dates).  [facility]  # Patient was seen by nurse practitioner last week prior to her cycle 2 daratumumab.  She reports feeling shortness of breath, having cough, believed to have bronchitis and was prescribed Levaquin 500 mg p.o. daily for 7 days, and albuterol as needed.  She also had a VQ scan low probability for pulmonary embolism. Patient reports cough is better.    INTERVAL HISTORY Cassie Overholseris a 72y.o. female who has above history reviewed by me today presents for follow up visit for management of multiple myeloma. Reports feeling better. She is getting palliative RT to spine. No fever or chills. She complains left lower extremity swelling x 1 week, no tenderness.  Today she is Day 271of Pomalyst.   Current Treatment # 3/14, 3/15 at outside facility, she got split dose of daratumumab. #  01/08/2018 Start on weekly daratumumab 127mm2, dexamethasone 207m 01/08/2018 Pomalyst 4mg25my 1-21 ogf 28 day cycle.  # Palliative RT to spine.   Current Pain medication: Oxycodone 5mg 78m as needed. Oxycontin 10mg 54m.   Review of Systems  Constitutional: Positive for malaise/fatigue. Negative for chills, fever and weight loss.  HENT: Negative for congestion, ear discharge, hearing loss and nosebleeds.   Eyes: Negative for blurred vision, photophobia and pain.  Respiratory: Negative for cough, sputum production and shortness of breath.   Cardiovascular: Positive for leg swelling. Negative  for chest pain, palpitations, orthopnea and claudication.  Gastrointestinal: Negative for abdominal pain, constipation, heartburn, nausea and vomiting.  Genitourinary: Negative for dysuria, frequency and urgency.  Musculoskeletal: Positive for back pain. Negative for neck pain.  Skin: Negative for rash.  Neurological:  Negative for dizziness, tremors, sensory change and weakness.  Endo/Heme/Allergies: Does not bruise/bleed easily.  Psychiatric/Behavioral: Negative for depression, hallucinations and substance abuse.    MEDICAL HISTORY:  Past Medical History:  Diagnosis Date  . Cancer ([place])   . GERD (gastroesophageal reflux disease)   . Hypertension   . Multiple myeloma ([place])     SURGICAL HISTORY: Past Surgical History:  Procedure Laterality Date  . BREAST BIOPSY Bilateral yrs ago   benign  . COLOSTOMY  2018  . COLOSTOMY REVERSAL     11 2018    SOCIAL HISTORY: Social History   Socioeconomic History  . Marital status: Married    Spouse name: Not on file  . Number of children: Not on file  . Years of education: Not on file  . Highest education level: Not on file  Occupational History  . Not on file  Social Needs  . Financial resource strain: Not on file  . Food insecurity:    Worry: Not on file    Inability: Not on file  . Transportation needs:    Medical: Not on file    Non-medical: Not on file  Tobacco Use  . Smoking status: Former Research scientist (life sciences)  . Smokeless tobacco: Never Used  Substance and Sexual Activity  . Alcohol use: Not Currently  . Drug use: Never  . Sexual activity: Not on file  Lifestyle  . Physical activity:    Days per week: Not on file    Minutes per session: Not on file  . Stress: Not on file  Relationships  . Social connections:    Talks on phone: Not on file    Gets together: Not on file    Attends religious service: Not on file    Active member of club or organization: Not on file    Attends meetings of clubs or organizations: Not on file    Relationship status: Not on file  . Intimate partner violence:    Fear of current or ex partner: Not on file    Emotionally abused: Not on file    Physically abused: Not on file    Forced sexual activity: Not on file  Other Topics Concern  . Not on file  Social History Narrative  . Not on file    FAMILY  HISTORY: Family History  Problem Relation Age of Onset  . Breast cancer Mother 62  . Cancer Brother 24       proastate     ALLERGIES:  is allergic to codeine.  MEDICATIONS:  Current Outpatient Medications  Medication Sig Dispense Refill  . albuterol (VENTOLIN HFA) 108 (90 Base) MCG/ACT inhaler Inhale 2 puffs into the lungs every 6 (six) hours as needed. 1 Inhaler 1  . erythromycin (ROMYCIN) ophthalmic ointment Place 1 application into the left eye 3 (three) times daily. Left eye only x 1 week 3.5 g 0  . fluticasone (FLOVENT DISKUS) 50 MCG/BLIST diskus inhaler Inhale 1 puff into the lungs 2 (two) times daily. 1 Inhaler 3  . furosemide (LASIX) 20 MG tablet Take 1 tablet (20 mg total) by mouth daily as needed. 30 tablet 2  . magic mouthwash w/lidocaine SOLN Take 5 mLs by mouth 3 (three) times daily. 240 mL 0  .  montelukast (SINGULAIR) 10 MG tablet Take 1 tablet by mouth daily.    . ondansetron (ZOFRAN) 8 MG tablet Take 1 tablet by mouth every 8 (eight) hours as needed.    Marland Kitchen oxyCODONE (OXY IR/ROXICODONE) 5 MG immediate release tablet Take 1 tablet by mouth 3 (three) times daily as needed.    Marland Kitchen oxyCODONE (OXYCONTIN) 10 mg 12 hr tablet Take 1 tablet (10 mg total) by mouth every 12 (twelve) hours. 28 tablet 0  . pantoprazole (PROTONIX) 40 MG tablet Take 1 tablet by mouth every morning.    Marland Kitchen POMALYST 4 MG capsule Take 1 capsule by mouth with water every day on days 1-21 every 28 days. 21 capsule 0  . pravastatin (PRAVACHOL) 10 MG tablet Take 1 tablet by mouth at bedtime.    Marland Kitchen spironolactone (ALDACTONE) 25 MG tablet Take 1 tablet (25 mg total) by mouth daily. 90 tablet 1   No current facility-administered medications for this visit.      PHYSICAL EXAMINATION: ECOG PERFORMANCE STATUS: 1 - Symptomatic but completely ambulatory Vitals:   01/29/18 1329  BP: 121/72  Pulse: (!) 58  Temp: 98 F (36.7 C)   Filed Weights   01/29/18 1329  Weight: 134 lb 4 oz (60.9 kg)    Physical Exam   Constitutional: She is oriented to person, place, and time and well-developed, well-nourished, and in no distress. No distress.  HENT:  Head: Normocephalic and atraumatic.  Mouth/Throat: No oropharyngeal exudate.  Eyes: Pupils are equal, round, and reactive to light. EOM are normal. Left eye exhibits no discharge. No scleral icterus.  Neck: Normal range of motion. Neck supple. No JVD present.  Cardiovascular: Normal rate and regular rhythm.  No murmur heard. Pulmonary/Chest: Effort normal. No respiratory distress. She has no wheezes.  Abdominal: Soft. She exhibits no distension. There is no tenderness. There is no rebound.  Musculoskeletal: Normal range of motion. She exhibits no edema.  Lymphadenopathy:    She has no cervical adenopathy.  Neurological: She is alert and oriented to person, place, and time. No cranial nerve deficit.  Skin: Skin is warm and dry. No rash noted. No erythema.  Psychiatric: Affect and judgment normal.     LABORATORY DATA:  I have reviewed the data as listed Lab Results  Component Value Date   WBC 2.4 (L) 01/22/2018   HGB 12.5 01/22/2018   HCT 36.1 01/22/2018   MCV 89.6 01/22/2018   PLT 115 (L) 01/22/2018   Recent Labs    01/08/18 0902 01/14/18 1115 01/22/18 0821  NA 136 135 137  K 3.9 3.7 4.3  CL 105 103 106  CO2 _0 GLUCOSE 107* 105* 103*  BUN _1 CREATININE 0.71 0.59 0.69  CALCIUM 8.7* 8.7* 8.7*  GFRNONAA >60 >60 >60  GFRAA >60 >60 >60  PROT 7.3 7.1 6.8  ALBUMIN 3.2* 3.1* 3.2*  AST _2 ALT 15 17 13*  ALKPHOS 73 97 95  BILITOT 1.1 1.2 1.0       ASSESSMENT & PLAN:  1. Left leg swelling   2. Multiple myeloma in relapse ([place])   3. Bone metastasis ([place])   4. Encounter for antineoplastic chemotherapy   5. Encounter for antineoplastic immunotherapy   6. Neuropathy   # Will order STAT US left LE to rule out DVT.  US showed no DVT.  Advise patient to take Aspirin 76m daily. Patient used to take Asprin while on  Revlimid previously and she stopped taking. She  understands the plan and will restart taking Aspirin.   #Multiple myeloma : Okay to proceed with weekly daratumumab, 5th treatment. She has taken her Singulair prior to treatment. Weekly IV dexamethasone.  (plan daratumumab 16 mg/m2 weekly for total of 8 weeks, following by Q2 weeks from week 9-24, followed by monthly from week 25 and beyond) Counts are acceptable, Today is Day 21 of first cycle of Pomalyst.    # Follow up with Radonc for palliative radiation.  # Neuropathy: continue gabapentin.  # # Chronic back pain: continue oxycodone 65m Q8h as needed.  All questions were answered. The patient knows to call the clinic with any problems questions or concerns.  Return of visit: 1  weeks for Daratumumab and IV Dexamethasone.    ZEarlie Server MD, PhD Hematology Oncology CNorthern [facility] Hospitalat [facility] DivisionPager- [phone]/09/2018

## 2018-01-29 NOTE — Progress Notes (Signed)
Patient here today for follow up.   

## 2018-02-01 ENCOUNTER — Ambulatory Visit: Payer: Medicare PPO

## 2018-02-01 ENCOUNTER — Encounter: Payer: Self-pay | Admitting: Internal Medicine

## 2018-02-01 NOTE — Progress Notes (Signed)
Chief Complaint  Patient presents with  . New Patient (Initial Visit)   NP 1. She has h/o MM dx'ed 01/2017 f/u with Dr. Tasia Catchings, [facility] H/O she will have 10th radiation tx tomorrow and she is getting IV infusions of medications via H/O. She is having low back pain with dx. Pain is 6/10 today  2. C/o b/l leg edema L>R on Spironlactone 25 mg qd. Prev. [place] 06/23/2017 neg DVT.  3.  c/o right ear is stopped up  4. C/l left lower inner lid red spot new w/o sx's 5. HTn controlled on spironlactone 25 mg qd   Review of Systems  Constitutional: Negative for weight loss.       Wt gain from 118 to 136   HENT: Negative for hearing loss.   Eyes:       Lesion in left eye  Respiratory: Negative for shortness of breath.   Cardiovascular: Negative for chest pain.  Gastrointestinal: Negative for abdominal pain.  Musculoskeletal: Positive for back pain.  Skin: Negative for rash.  Neurological:       +neuropathy  Psychiatric/Behavioral: Negative for depression.   Past Medical History:  Diagnosis Date  . Asthma   . Cancer ([place])   . GERD (gastroesophageal reflux disease)   . Hypertension   . Multiple myeloma ([place])    dx'ed 01/2018 follows Dr. Tasia Catchings and [facility] H/O    Past Surgical History:  Procedure Laterality Date  . BREAST BIOPSY Bilateral yrs ago   benign  . COLOSTOMY  2018  . COLOSTOMY REVERSAL     11 2018   Family History  Problem Relation Age of Onset  . Breast cancer Mother 51  . Cancer Mother        breast   . Diabetes Mother   . Heart disease Mother   . Hyperlipidemia Mother   . Hypertension Mother   . Birth defects Brother   . Diabetes Brother   . Heart disease Brother   . Hyperlipidemia Brother   . Cancer Father        prostate met to liver    Social History   Socioeconomic History  . Marital status: Married    Spouse name: Not on file  . Number of children: Not on file  . Years of education: Not on file  . Highest education level: Not on file  Occupational History  . Not on file   Social Needs  . Financial resource strain: Not on file  . Food insecurity:    Worry: Not on file    Inability: Not on file  . Transportation needs:    Medical: Not on file    Non-medical: Not on file  Tobacco Use  . Smoking status: Former Research scientist (life sciences)  . Smokeless tobacco: Never Used  Substance and Sexual Activity  . Alcohol use: Not Currently  . Drug use: Never  . Sexual activity: Yes    Comment: husband   Lifestyle  . Physical activity:    Days per week: Not on file    Minutes per session: Not on file  . Stress: Not on file  Relationships  . Social connections:    Talks on phone: Not on file    Gets together: Not on file    Attends religious service: Not on file    Active member of club or organization: Not on file    Attends meetings of clubs or organizations: Not on file    Relationship status: Not on file  . Intimate partner violence:  Fear of current or ex partner: Not on file    Emotionally abused: Not on file    Physically abused: Not on file    Forced sexual activity: Not on file  Other Topics Concern  . Not on file  Social History Narrative   Married    Husband lives in [place] while she gets treatment in this area    Current Meds  Medication Sig  . albuterol (VENTOLIN HFA) 108 (90 Base) MCG/ACT inhaler Inhale 2 puffs into the lungs every 6 (six) hours as needed.  . fluticasone (FLOVENT DISKUS) 50 MCG/BLIST diskus inhaler Inhale 1 puff into the lungs 2 (two) times daily.  . magic mouthwash w/lidocaine SOLN Take 5 mLs by mouth 3 (three) times daily.  . montelukast (SINGULAIR) 10 MG tablet Take 1 tablet by mouth daily.  . ondansetron (ZOFRAN) 8 MG tablet Take 1 tablet by mouth every 8 (eight) hours as needed.  Marland Kitchen oxyCODONE (OXY IR/ROXICODONE) 5 MG immediate release tablet Take 1 tablet by mouth 3 (three) times daily as needed.  Marland Kitchen oxyCODONE (OXYCONTIN) 10 mg 12 hr tablet Take 1 tablet (10 mg total) by mouth every 12 (twelve) hours.  . pantoprazole (PROTONIX) 40  MG tablet Take 1 tablet by mouth every morning.  Marland Kitchen POMALYST 4 MG capsule Take 1 capsule by mouth with water every day on days 1-21 every 28 days.  . pravastatin (PRAVACHOL) 10 MG tablet Take 1 tablet by mouth at bedtime.  Marland Kitchen spironolactone (ALDACTONE) 25 MG tablet Take 1 tablet (25 mg total) by mouth daily.  . [DISCONTINUED] spironolactone (ALDACTONE) 25 MG tablet Take 1 tablet by mouth daily.   Allergies  Allergen Reactions  . Codeine Nausea And Vomiting   Recent Results (from the past 2160 hour(s))  Multiple Myeloma Panel (SPEP&IFE w/QIG)     Status: Abnormal   Collection Time: 01/04/18 12:37 PM  Result Value Ref Range   IgG (Immunoglobin G), Serum 2,397 (H) 700 - 1,600 mg/dL   IgA 15 (L) 87 - 352 mg/dL    Comment: Result confirmed on concentration.   IgM (Immunoglobulin M), Srm 27 26 - 217 mg/dL   Total Protein ELP 7.2 6.0 - 8.5 g/dL   Albumin SerPl Elph-Mcnc 3.2 2.9 - 4.4 g/dL   Alpha 1 0.3 0.0 - 0.4 g/dL   Alpha2 Glob SerPl Elph-Mcnc 1.1 (H) 0.4 - 1.0 g/dL   B-Globulin SerPl Elph-Mcnc 0.9 0.7 - 1.3 g/dL   Gamma Glob SerPl Elph-Mcnc 1.8 0.4 - 1.8 g/dL   M Protein SerPl Elph-Mcnc 1.5 (H) Not Observed g/dL   Globulin, Total 4.0 (H) 2.2 - 3.9 g/dL   Albumin/Glob SerPl 0.9 0.7 - 1.7   IFE 1 Comment     Comment: (NOTE) Immunofixation shows IgG monoclonal protein with lambda light chain specificity.    Please Note Comment     Comment: (NOTE) Protein electrophoresis scan will follow via computer, mail, or courier delivery. Performed At: [facility] [place], [place] [id number] Rush Farmer MD IO:[id number] Performed at [facility], [address] [place], [place] [address]   Comprehensive metabolic panel     Status: Abnormal   Collection Time: 01/04/18  1:08 PM  Result Value Ref Range   Sodium 136 135 - 145 mmol/L   Potassium 3.9 3.5 - 5.1 mmol/L   Chloride 105 101 - 111 mmol/L   CO2 24 22 - 32 mmol/L   Glucose, Bld 138 (H) 65 - 99 mg/dL     BUN  13 6 - 20 mg/dL   Creatinine, Ser 0.64 0.44 - 1.00 mg/dL   Calcium 8.4 (L) 8.9 - 10.3 mg/dL   Total Protein 7.9 6.5 - 8.1 g/dL   Albumin 3.4 (L) 3.5 - 5.0 g/dL   AST 19 15 - 41 U/L   ALT 17 14 - 54 U/L   Alkaline Phosphatase 85 38 - 126 U/L   Total Bilirubin 1.2 0.3 - 1.2 mg/dL   GFR calc non Af Amer >60 >60 mL/min   GFR calc Af Amer >60 >60 mL/min    Comment: (NOTE) The eGFR has been calculated using the CKD EPI equation. This calculation has not been validated in all clinical situations. eGFR's persistently <60 mL/min signify possible Chronic Kidney Disease.    Anion gap 7 5 - 15    Comment: Performed at [facility], [address] [place], [place] [address]  CBC with Differential/Platelet     Status: None   Collection Time: 01/04/18  1:08 PM  Result Value Ref Range   WBC 5.9 3.6 - 11.0 K/uL   RBC 4.39 3.80 - 5.20 MIL/uL   Hemoglobin 13.7 12.0 - 16.0 g/dL   HCT 40.5 35.0 - 47.0 %   MCV 92.1 80.0 - 100.0 fL   MCH 31.2 26.0 - 34.0 pg   MCHC 33.8 32.0 - 36.0 g/dL   RDW 14.5 11.5 - 14.5 %   Platelets 166 150 - 440 K/uL   Neutrophils Relative % 69 %   Neutro Abs 4.1 1.4 - 6.5 K/uL   Lymphocytes Relative 18 %   Lymphs Abs 1.0 1.0 - 3.6 K/uL   Monocytes Relative 12 %   Monocytes Absolute 0.7 0.2 - 0.9 K/uL   Eosinophils Relative 1 %   Eosinophils Absolute 0.0 0 - 0.7 K/uL   Basophils Relative 0 %   Basophils Absolute 0.0 0 - 0.1 K/uL    Comment: Performed at [facility], [address] [place], [place] [address]  Type and screen     Status: None   Collection Time: 01/08/18  8:53 AM  Result Value Ref Range   ABO/RH(D) B POS    Antibody Screen POS    Sample Expiration 01/11/2018   Comprehensive metabolic panel     Status: Abnormal   Collection Time: 01/08/18  9:02 AM  Result Value Ref Range   Sodium 136 135 - 145 mmol/L   Potassium 3.9 3.5 - 5.1 mmol/L   Chloride 105 101 - 111 mmol/L   CO2 24 22 - 32 mmol/L   Glucose, Bld 107 (H) 65 - 99 mg/dL    BUN 13 6 - 20 mg/dL   Creatinine, Ser 0.71 0.44 - 1.00 mg/dL   Calcium 8.7 (L) 8.9 - 10.3 mg/dL   Total Protein 7.3 6.5 - 8.1 g/dL   Albumin 3.2 (L) 3.5 - 5.0 g/dL   AST 15 15 - 41 U/L   ALT 15 14 - 54 U/L   Alkaline Phosphatase 73 38 - 126 U/L   Total Bilirubin 1.1 0.3 - 1.2 mg/dL   GFR calc non Af Amer >60 >60 mL/min   GFR calc Af Amer >60 >60 mL/min    Comment: (NOTE) The eGFR has been calculated using the CKD EPI equation. This calculation has not been validated in all clinical situations. eGFR's persistently <60 mL/min signify possible Chronic Kidney Disease.    Anion gap 7 5 - 15    Comment: Performed at [facility], [address],  [place], [place] [address]  CBC with Differential     Status: Abnormal   Collection Time: 01/08/18  9:02 AM  Result Value Ref Range   WBC 5.4 3.6 - 11.0 K/uL   RBC 4.01 3.80 - 5.20 MIL/uL   Hemoglobin 12.6 12.0 - 16.0 g/dL   HCT 36.8 35.0 - 47.0 %   MCV 91.7 80.0 - 100.0 fL   MCH 31.3 26.0 - 34.0 pg   MCHC 34.2 32.0 - 36.0 g/dL   RDW 14.5 11.5 - 14.5 %   Platelets 146 (L) 150 - 440 K/uL   Neutrophils Relative % 72 %   Neutro Abs 3.8 1.4 - 6.5 K/uL   Lymphocytes Relative 13 %   Lymphs Abs 0.7 (L) 1.0 - 3.6 K/uL   Monocytes Relative 14 %   Monocytes Absolute 0.8 0.2 - 0.9 K/uL   Eosinophils Relative 1 %   Eosinophils Absolute 0.1 0 - 0.7 K/uL   Basophils Relative 0 %   Basophils Absolute 0.0 0 - 0.1 K/uL    Comment: Performed at [facility], [address] [place], [place] [address]  CBC with Differential/Platelet     Status: Abnormal   Collection Time: 01/14/18 11:15 AM  Result Value Ref Range   WBC 2.9 (L) 3.6 - 11.0 K/uL   RBC 4.01 3.80 - 5.20 MIL/uL   Hemoglobin 12.5 12.0 - 16.0 g/dL   HCT 36.4 35.0 - 47.0 %   MCV 90.8 80.0 - 100.0 fL   MCH 31.2 26.0 - 34.0 pg   MCHC 34.4 32.0 - 36.0 g/dL   RDW 14.4 11.5 - 14.5 %   Platelets 129 (L) 150 - 440 K/uL   Neutrophils Relative % 75 %   Neutro Abs 2.2 1.4 - 6.5  K/uL   Lymphocytes Relative 12 %   Lymphs Abs 0.3 (L) 1.0 - 3.6 K/uL   Monocytes Relative 11 %   Monocytes Absolute 0.3 0.2 - 0.9 K/uL   Eosinophils Relative 2 %   Eosinophils Absolute 0.1 0 - 0.7 K/uL   Basophils Relative 0 %   Basophils Absolute 0.0 0 - 0.1 K/uL    Comment: Performed at [facility], [place], [place] [address]  Fibrin derivatives D-[facility])     Status: Abnormal   Collection Time: 01/14/18 11:15 AM  Result Value Ref Range   Fibrin derivatives D-dimer ([facility]) 3,701.30 (H) 0.00 - 499.00 ng/mL (FEU)    Comment: (NOTE) <> Exclusion of Venous Thromboembolism (VTE) - OUTPATIENT ONLY   (Emergency Department or [place])   0-499 ng/ml (FEU): With a low to intermediate pretest probability                      for VTE this test result excludes the diagnosis                      of VTE.   >499 ng/ml (FEU) : VTE not excluded; additional work up for VTE is                      required. <> Testing on Inpatients and Evaluation of Disseminated Intravascular   Coagulation (DIC) Reference Range:   0-499 ng/ml (FEU) Performed at [facility], [address] [place],  [address]   Culture, blood (routine x 2)     Status: None   Collection Time: 01/14/18 11:15 AM  Result Value Ref Range   Specimen Description  BLOOD Performed at [facility], [address] [place], [place] [address]    Special Requests      NONE Performed at [facility], [address] [place], [place] [address]    Culture      NO GROWTH 5 DAYS Performed at [facility], [address] [place], [place] [address]    Report Status 01/19/2018 FINAL   Comprehensive metabolic panel     Status: Abnormal   Collection Time: 01/14/18 11:15 AM  Result Value Ref Range   Sodium 135 135 - 145 mmol/L   Potassium 3.7 3.5 - 5.1 mmol/L   Chloride 103 101 - 111 mmol/L   CO2 24 22 - 32 mmol/L   Glucose, Bld 105 (H) 65 - 99 mg/dL   BUN 13 6 - 20  mg/dL   Creatinine, Ser 0.59 0.44 - 1.00 mg/dL   Calcium 8.7 (L) 8.9 - 10.3 mg/dL   Total Protein 7.1 6.5 - 8.1 g/dL   Albumin 3.1 (L) 3.5 - 5.0 g/dL   AST 16 15 - 41 U/L   ALT 17 14 - 54 U/L   Alkaline Phosphatase 97 38 - 126 U/L   Total Bilirubin 1.2 0.3 - 1.2 mg/dL   GFR calc non Af Amer >60 >60 mL/min   GFR calc Af Amer >60 >60 mL/min    Comment: (NOTE) The eGFR has been calculated using the CKD EPI equation. This calculation has not been validated in all clinical situations. eGFR's persistently <60 mL/min signify possible Chronic Kidney Disease.    Anion gap 8 5 - 15    Comment: Performed at [facility], [address] [place], [place] [address]  Culture, blood (routine x 2)     Status: None   Collection Time: 01/14/18 11:16 AM  Result Value Ref Range   Specimen Description      BLOOD Performed at [facility], [address] [place], [place] [address]    Special Requests      NONE Performed at [facility], [address] [place], [place] [address]    Culture      NO GROWTH 5 DAYS Performed at [facility], [address] [place], [place] [address]    Report Status 01/19/2018 FINAL   Comprehensive metabolic panel     Status: Abnormal   Collection Time: 01/22/18  8:21 AM  Result Value Ref Range   Sodium 137 135 - 145 mmol/L   Potassium 4.3 3.5 - 5.1 mmol/L   Chloride 106 101 - 111 mmol/L   CO2 24 22 - 32 mmol/L   Glucose, Bld 103 (H) 65 - 99 mg/dL   BUN 11 6 - 20 mg/dL   Creatinine, Ser 0.69 0.44 - 1.00 mg/dL   Calcium 8.7 (L) 8.9 - 10.3 mg/dL   Total Protein 6.8 6.5 - 8.1 g/dL   Albumin 3.2 (L) 3.5 - 5.0 g/dL   AST 15 15 - 41 U/L   ALT 13 (L) 14 - 54 U/L   Alkaline Phosphatase 95 38 - 126 U/L   Total Bilirubin 1.0 0.3 - 1.2 mg/dL   GFR calc non Af Amer >60 >60 mL/min   GFR calc Af Amer >60 >60 mL/min    Comment: (NOTE) The eGFR has been calculated using the CKD EPI equation. This calculation has not been validated in  all clinical situations. eGFR's persistently <60 mL/min signify possible Chronic Kidney Disease.    Anion gap 7 5 - 15  Comment: Performed at [facility], [address] [place], [place] [address]  CBC with Differential     Status: Abnormal   Collection Time: 01/22/18  8:21 AM  Result Value Ref Range   WBC 2.4 (L) 3.6 - 11.0 K/uL   RBC 4.03 3.80 - 5.20 MIL/uL   Hemoglobin 12.5 12.0 - 16.0 g/dL   HCT 36.1 35.0 - 47.0 %   MCV 89.6 80.0 - 100.0 fL   MCH 30.9 26.0 - 34.0 pg   MCHC 34.5 32.0 - 36.0 g/dL   RDW 14.2 11.5 - 14.5 %   Platelets 115 (L) 150 - 440 K/uL   Neutrophils Relative % 42 %   Neutro Abs 1.0 (L) 1.4 - 6.5 K/uL   Lymphocytes Relative 27 %   Lymphs Abs 0.6 (L) 1.0 - 3.6 K/uL   Monocytes Relative 28 %   Monocytes Absolute 0.7 0.2 - 0.9 K/uL   Eosinophils Relative 2 %   Eosinophils Absolute 0.0 0 - 0.7 K/uL   Basophils Relative 1 %   Basophils Absolute 0.0 0 - 0.1 K/uL    Comment: Performed at [facility], [address] [place], [place] [address]  Comprehensive metabolic panel     Status: Abnormal   Collection Time: 01/29/18 10:56 AM  Result Value Ref Range   Sodium 137 135 - 145 mmol/L   Potassium 3.8 3.5 - 5.1 mmol/L   Chloride 107 101 - 111 mmol/L   CO2 23 22 - 32 mmol/L   Glucose, Bld 97 65 - 99 mg/dL   BUN 11 6 - 20 mg/dL   Creatinine, Ser 0.52 0.44 - 1.00 mg/dL   Calcium 8.4 (L) 8.9 - 10.3 mg/dL   Total Protein 6.3 (L) 6.5 - 8.1 g/dL   Albumin 3.1 (L) 3.5 - 5.0 g/dL   AST 14 (L) 15 - 41 U/L   ALT 13 (L) 14 - 54 U/L   Alkaline Phosphatase 78 38 - 126 U/L   Total Bilirubin 1.2 0.3 - 1.2 mg/dL   GFR calc non Af Amer >60 >60 mL/min   GFR calc Af Amer >60 >60 mL/min    Comment: (NOTE) The eGFR has been calculated using the CKD EPI equation. This calculation has not been validated in all clinical situations. eGFR's persistently <60 mL/min signify possible Chronic Kidney Disease.    Anion gap 7 5 - 15    Comment: Performed at [facility], [address] [place], [place] [address]  CBC with Differential     Status: Abnormal   Collection Time: 01/29/18 10:56 AM  Result Value Ref Range   WBC 2.2 (L) 3.6 - 11.0 K/uL   RBC 4.12 3.80 - 5.20 MIL/uL   Hemoglobin 12.5 12.0 - 16.0 g/dL   HCT 36.6 35.0 - 47.0 %   MCV 88.8 80.0 - 100.0 fL   MCH 30.4 26.0 - 34.0 pg   MCHC 34.3 32.0 - 36.0 g/dL   RDW 14.8 (H) 11.5 - 14.5 %   Platelets 156 150 - 440 K/uL   Neutrophils Relative % 30 %   Neutro Abs 0.7 (L) 1.4 - 6.5 K/uL   Lymphocytes Relative 29 %   Lymphs Abs 0.6 (L) 1.0 - 3.6 K/uL   Monocytes Relative 38 %   Monocytes Absolute 0.9 0.2 - 0.9 K/uL   Eosinophils Relative 2 %   Eosinophils Absolute 0.0 0 - 0.7 K/uL   Basophils Relative 1 %   Basophils Absolute 0.0 0 - 0.1  K/uL    Comment: Performed at [facility], [address] [place], [place] [address]   Objective  Body mass index is 24.91 kg/m. Wt Readings from Last 3 Encounters:  01/29/18 134 lb 4 oz (60.9 kg)  01/28/18 136 lb 3.2 oz (61.8 kg)  01/22/18 132 lb 12.8 oz (60.2 kg)   Temp Readings from Last 3 Encounters:  01/29/18 98 F (36.7 C) (Tympanic)  01/28/18 98.3 F (36.8 C) (Oral)  01/22/18 (!) 97.4 F (36.3 C) (Tympanic)   BP Readings from Last 3 Encounters:  01/29/18 121/72  01/28/18 120/60  01/22/18 (P) 123/68   Pulse Readings from Last 3 Encounters:  01/29/18 (!) 58  01/28/18 64  01/22/18 (P) 66    Physical Exam  Constitutional: She is oriented to person, place, and time. Vital signs are normal. She appears well-developed and well-nourished.  HENT:  Head: Normocephalic.  Mouth/Throat: Oropharynx is clear and moist and mucous membranes are normal.  Fluid in b/l ears   Eyes: Pupils are equal, round, and reactive to light. Conjunctivae are normal.    Cardiovascular: Normal rate, regular rhythm and normal heart sounds.  2+ leg edema L>R  No calf pain b/l    Pulmonary/Chest: Effort normal and breath sounds normal.    Neurological: She is alert and oriented to person, place, and time.  Slowed gait   Skin: Skin is warm, dry and intact.  Psychiatric: She has a normal mood and affect. Her speech is normal and behavior is normal. Judgment and thought content normal. Cognition and memory are normal.  Nursing note and vitals reviewed.   Assessment   1. 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 B/l leg edema L>R r/o DVT  3. Fluid in b/l ears  4. Left inner lid with ulceration  5. HM 6. HTN controlled  Plan   1. F/u H/o [place], [facility] H/O considering Zometa  Prn pain meds  2.  Will do doppler b/l legs  Add lasix 20 to spironolactone 25 3. rec otc antihistamine and flonase  4. E mcyin  5.  Had flu shot  Need to check other vaccines Tdap, shingrix, prevnar, pna 23   mammo ordered today  Colonoscopy per pt had 3-4 years ago rectal bleeding h/o sigmoid colon resection  Pap sp hysterectomy endometrosis DEXA will disc in future per pt had 06/2014 osteopenia will need to check vit D in future  F/u [facility] H/O Dr. Amalia Hailey 03/10/18 disc Zometa start    6. Cont meds    "I spent 45 minutes face-to face with patient with greater than 50% of time spent counseling and/or in coordination of care review of plan with extensive chart review and plan of care   Provider: Dr. Olivia Mackie McLean-Scocuzza-Internal Medicine

## 2018-02-05 ENCOUNTER — Inpatient Hospital Stay: Payer: Medicare PPO

## 2018-02-05 ENCOUNTER — Other Ambulatory Visit: Payer: Self-pay

## 2018-02-05 ENCOUNTER — Telehealth: Payer: Self-pay | Admitting: *Deleted

## 2018-02-05 ENCOUNTER — Inpatient Hospital Stay (HOSPITAL_BASED_OUTPATIENT_CLINIC_OR_DEPARTMENT_OTHER): Payer: Medicare PPO | Admitting: Oncology

## 2018-02-05 ENCOUNTER — Ambulatory Visit
Admission: RE | Admit: 2018-02-05 | Discharge: 2018-02-05 | Disposition: A | Discharge status: Home / Self Care | Payer: Medicare PPO | Source: Ambulatory Visit | Attending: Oncology | Admitting: Oncology

## 2018-02-05 ENCOUNTER — Encounter: Payer: Self-pay | Admitting: Oncology

## 2018-02-05 VITALS — BP 112/64 | HR 74 | Resp 16 | Wt 129.0 lb

## 2018-02-05 DIAGNOSIS — Z885 Allergy status to narcotic agent status: Secondary | ICD-10-CM

## 2018-02-05 DIAGNOSIS — G8929 Other chronic pain: Secondary | ICD-10-CM | POA: Diagnosis not present

## 2018-02-05 DIAGNOSIS — C9002 Multiple myeloma in relapse: Secondary | ICD-10-CM | POA: Diagnosis not present

## 2018-02-05 DIAGNOSIS — C7951 Secondary malignant neoplasm of bone: Secondary | ICD-10-CM | POA: Diagnosis not present

## 2018-02-05 DIAGNOSIS — R05 Cough: Secondary | ICD-10-CM

## 2018-02-05 DIAGNOSIS — Z792 Long term (current) use of antibiotics: Secondary | ICD-10-CM

## 2018-02-05 DIAGNOSIS — G629 Polyneuropathy, unspecified: Secondary | ICD-10-CM | POA: Diagnosis not present

## 2018-02-05 DIAGNOSIS — J984 Other disorders of lung: Secondary | ICD-10-CM | POA: Diagnosis not present

## 2018-02-05 DIAGNOSIS — R059 Cough, unspecified: Secondary | ICD-10-CM

## 2018-02-05 DIAGNOSIS — I6522 Occlusion and stenosis of left carotid artery: Secondary | ICD-10-CM | POA: Diagnosis not present

## 2018-02-05 DIAGNOSIS — M549 Dorsalgia, unspecified: Secondary | ICD-10-CM | POA: Diagnosis not present

## 2018-02-05 DIAGNOSIS — Z5112 Encounter for antineoplastic immunotherapy: Secondary | ICD-10-CM | POA: Diagnosis not present

## 2018-02-05 DIAGNOSIS — R5383 Other fatigue: Secondary | ICD-10-CM

## 2018-02-05 DIAGNOSIS — I10 Essential (primary) hypertension: Secondary | ICD-10-CM | POA: Diagnosis not present

## 2018-02-05 DIAGNOSIS — D701 Agranulocytosis secondary to cancer chemotherapy: Secondary | ICD-10-CM

## 2018-02-05 DIAGNOSIS — F419 Anxiety disorder, unspecified: Secondary | ICD-10-CM

## 2018-02-05 DIAGNOSIS — J441 Chronic obstructive pulmonary disease with (acute) exacerbation: Secondary | ICD-10-CM

## 2018-02-05 DIAGNOSIS — T451X5S Adverse effect of antineoplastic and immunosuppressive drugs, sequela: Secondary | ICD-10-CM

## 2018-02-05 DIAGNOSIS — K219 Gastro-esophageal reflux disease without esophagitis: Secondary | ICD-10-CM

## 2018-02-05 DIAGNOSIS — Z79899 Other long term (current) drug therapy: Secondary | ICD-10-CM

## 2018-02-05 DIAGNOSIS — I700 Atherosclerosis of aorta: Secondary | ICD-10-CM | POA: Diagnosis not present

## 2018-02-05 DIAGNOSIS — Z8042 Family history of malignant neoplasm of prostate: Secondary | ICD-10-CM

## 2018-02-05 DIAGNOSIS — R5381 Other malaise: Secondary | ICD-10-CM | POA: Diagnosis not present

## 2018-02-05 DIAGNOSIS — Z7982 Long term (current) use of aspirin: Secondary | ICD-10-CM

## 2018-02-05 DIAGNOSIS — Z87311 Personal history of (healed) other pathological fracture: Secondary | ICD-10-CM

## 2018-02-05 DIAGNOSIS — Z87891 Personal history of nicotine dependence: Secondary | ICD-10-CM

## 2018-02-05 DIAGNOSIS — Z5111 Encounter for antineoplastic chemotherapy: Secondary | ICD-10-CM

## 2018-02-05 LAB — CBC WITH DIFFERENTIAL/PLATELET
BASOS ABS: 0.1 10*3/uL (ref 0–0.1)
Basophils Relative: 2 %
Eosinophils Absolute: 0 10*3/uL (ref 0–0.7)
Eosinophils Relative: 1 %
HEMATOCRIT: 38.1 % (ref 35.0–47.0)
HEMOGLOBIN: 13 g/dL (ref 12.0–16.0)
Lymphocytes Relative: 22 %
Lymphs Abs: 0.6 10*3/uL — ABNORMAL LOW (ref 1.0–3.6)
MCH: 30.3 pg (ref 26.0–34.0)
MCHC: 34 g/dL (ref 32.0–36.0)
MCV: 89.1 fL (ref 80.0–100.0)
MONOS PCT: 46 %
Monocytes Absolute: 1.3 10*3/uL — ABNORMAL HIGH (ref 0.2–0.9)
NEUTROS ABS: 0.8 10*3/uL — AB (ref 1.4–6.5)
NEUTROS PCT: 29 %
Platelets: 195 10*3/uL (ref 150–440)
RBC: 4.28 MIL/uL (ref 3.80–5.20)
RDW: 14.9 % — ABNORMAL HIGH (ref 11.5–14.5)
WBC: 2.7 10*3/uL — ABNORMAL LOW (ref 3.6–11.0)

## 2018-02-05 LAB — COMPREHENSIVE METABOLIC PANEL
ALK PHOS: 68 U/L (ref 38–126)
ALT: 15 U/L (ref 14–54)
ANION GAP: 8 (ref 5–15)
AST: 19 U/L (ref 15–41)
Albumin: 3.1 g/dL — ABNORMAL LOW (ref 3.5–5.0)
BUN: 10 mg/dL (ref 6–20)
CALCIUM: 8.7 mg/dL — AB (ref 8.9–10.3)
CO2: 24 mmol/L (ref 22–32)
Chloride: 105 mmol/L (ref 101–111)
Creatinine, Ser: 0.66 mg/dL (ref 0.44–1.00)
GFR calc non Af Amer: >60 mL/min (ref >60)
Glucose, Bld: 98 mg/dL (ref 65–99)
Potassium: 3.4 mmol/L — ABNORMAL LOW (ref 3.5–5.1)
SODIUM: 137 mmol/L (ref 135–145)
Total Bilirubin: 1 mg/dL (ref 0.3–1.2)
Total Protein: 6.3 g/dL — ABNORMAL LOW (ref 6.5–8.1)

## 2018-02-05 MED ORDER — PREDNISONE 10 MG (21) PO TBPK: ORAL_TABLET | ORAL | 0 refills | Status: DC

## 2018-02-05 MED ORDER — AZITHROMYCIN 250 MG PO TABS: ORAL_TABLET | ORAL | 0 refills | Status: DC

## 2018-02-05 NOTE — Telephone Encounter (Signed)
Faxed hard copy and called pharmacy to confirm they received

## 2018-02-05 NOTE — Progress Notes (Signed)
Hematology/Oncology Follow up note [facility] Telephone:([phone] Fax:(336) [phone]   Patient Care Team: McLean-Scocuzza, Nino Glow, MD as PCP - General (Internal Medicine)  REFERRING PROVIDER: Dr. Baruch Gouty, Donette  REASON FOR VISIT Follow up for treatment of MM  HISTORY OF PRESENTING ILLNESS:  Cassie Jones is a  72 y.o.  female with PMH listed below who was referred to me for evaluation of multiple myeloma.  Patient used to follow-up with local oncologist Dr. Tonia Brooms and [facility] oncologist Dr. Amalia Hailey.  Patient tells me that as her husband works every day and has a busy working schedule, she is not able to get transportation to her treatment.  As an alternative she is now living with her brother who is close to [facility] [place] and want to transfer care here. Extensive medical record review was performed.    Patient was diagnosed with IgG lambda, ISS2, t (4,14) multiple myeloma on February 03, 2017, she has hemoglobin of 9, calcium 8.9, LDH 132, beta microglobulin 4.6, albumin 3.2.  Skeletal survey negative.  PET scan with single nonspecific humeral lesion and a thyroid nodule.  Marrow with 60% plasmacytosis by CD138 IHC, FISH with hyperdiploidy t(4, 14), 1 q. gain.. Per note, she was treated with CyborD (M spike 3.6 at start)  from 01/2017 to 02/2017. Her treatment was switched to Revlimid Velcade dexamethasone from 02/2017, M spike 1.2 at start,and was found to have progression of disease in 09/2017, when her M spike increased to 1.8, and developed compression fracture in 10/2016. She was restarted on RVD using weekly Velcade.  She was recently seen by Dr. Boris Sharper and recommended to start on a combination of daratumumab, Pomalyst, dexamethasone. Per patient she received her first daratumumab last week with split dose '8mg'$ /kg given on 3/14 and 3/15.  Patient reports that she tolerate the treatment well except mild infusion reaction.  # Patient  also reports that she has got dental clearance and has been given 1 dose of Zometa by Dr. Remus Loffler (unkown dates).  Autologous transplant has been discussed with patient at [facility] and patient has not decided.  # Patient was seen by nurse practitioner last week prior to her cycle 2 daratumumab.  She reports feeling shortness of breath, having cough, believed to have bronchitis and was prescribed Levaquin 500 mg p.o. daily for 7 days, and albuterol as needed.  She also had a VQ scan low probability for pulmonary embolism.    INTERVAL HISTORY Cassie Jones is a 72 y.o. female who has above history reviewed by me today presents for follow up visit for management of multiple myeloma. She reports not feeling well today. She feels congested and has been coughing for a few days. Also feels mid lower chest pain when coughing. Denies fever, chill, nausea, vomiting.    Current Treatment # 3/14, 3/15 at outside facility, she got split dose of daratumumab. #  01/08/2018 Start on weekly daratumumab '16mg'$ /m2, dexamethasone '20mg'$ ,  01/08/2018 Pomalyst '4mg'$  Day 1-21 ogf 28 day cycle.  # Palliative RT to spine.   Current Pain medication: Oxycodone '5mg'$  Q8h as needed. Oxycontin '10mg'$  Q12h.   Review of Systems  Constitutional: Positive for malaise/fatigue. Negative for chills, fever and weight loss.  HENT: Negative for congestion, ear discharge, hearing loss and nosebleeds.   Eyes: Negative for blurred vision, photophobia and pain.  Respiratory: Positive for cough, sputum production and wheezing. Negative for shortness of breath.   Cardiovascular: Positive for leg swelling. Negative for palpitations, orthopnea and  claudication.       Chest wall pain while coughing.   Gastrointestinal: Negative for abdominal pain, blood in stool, constipation, heartburn, nausea and vomiting.  Genitourinary: Negative for dysuria, frequency, hematuria and urgency.  Musculoskeletal: Positive for back pain. Negative for myalgias and  neck pain.  Skin: Negative for rash.  Neurological: Negative for dizziness, tremors, sensory change, speech change, weakness and headaches.  Endo/Heme/Allergies: Negative for environmental allergies. Does not bruise/bleed easily.  Psychiatric/Behavioral: Negative for depression, hallucinations, substance abuse and suicidal ideas.    MEDICAL HISTORY:  Past Medical History:  Diagnosis Date  . Ascites   . Asthma   . Bilateral leg edema   . Cancer ([place])   . Chicken pox   . Colon polyps   . Diverticulitis    with perforation 02/2017 and hosp with colostomy bag s/p removal   . GERD (gastroesophageal reflux disease)   . Hyperlipidemia   . Hypertension   . Multiple myeloma ([place])    dx'ed 01/2018 follows Dr. Tasia Catchings and [facility] H/O   . Pleural effusion   . Thyroid nodule   . UTI (urinary tract infection)   . Vitamin D deficiency     SURGICAL HISTORY: Past Surgical History:  Procedure Laterality Date  . ABDOMINAL HYSTERECTOMY    . BREAST BIOPSY Bilateral yrs ago   benign  . CHOLECYSTECTOMY    . COLOSTOMY  2018  . COLOSTOMY REVERSAL     11 2018  . KYPHOPLASTY     11/2017 [place]     SOCIAL HISTORY: Social History   Socioeconomic History  . Marital status: Married    Spouse name: Not on file  . Number of children: Not on file  . Years of education: Not on file  . Highest education level: Not on file  Occupational History  . Not on file  Social Needs  . Financial resource strain: Not on file  . Food insecurity:    Worry: Not on file    Inability: Not on file  . Transportation needs:    Medical: Not on file    Non-medical: Not on file  Tobacco Use  . Smoking status: Former Research scientist (life sciences)  . Smokeless tobacco: Never Used  Substance and Sexual Activity  . Alcohol use: Not Currently  . Drug use: Never  . Sexual activity: Yes    Comment: husband   Lifestyle  . Physical activity:    Days per week: Not on file    Minutes per session: Not on file  . Stress: Not on file    Relationships  . Social connections:    Talks on phone: Not on file    Gets together: Not on file    Attends religious service: Not on file    Active member of club or organization: Not on file    Attends meetings of clubs or organizations: Not on file    Relationship status: Not on file  . Intimate partner violence:    Fear of current or ex partner: Not on file    Emotionally abused: Not on file    Physically abused: Not on file    Forced sexual activity: Not on file  Other Topics Concern  . Not on file  Social History Narrative   Married    Husband lives in [place] while she gets treatment in this area    She lives alone in this area with family close by     FAMILY HISTORY: Family History  Problem Relation Age of Onset  .  Breast cancer Mother 51  . Cancer Mother        breast   . Diabetes Mother   . Heart disease Mother   . Hyperlipidemia Mother   . Hypertension Mother   . Birth defects Brother   . Diabetes Brother   . Heart disease Brother   . Hyperlipidemia Brother   . Cancer Father        prostate met to liver     ALLERGIES:  is allergic to codeine.  MEDICATIONS:  Current Outpatient Medications  Medication Sig Dispense Refill  . aspirin EC 81 MG tablet Take 1 tablet (81 mg total) by mouth daily. 90 tablet 3  . erythromycin (ROMYCIN) ophthalmic ointment Place 1 application into the left eye 3 (three) times daily. Left eye only x 1 week 3.5 g 0  . fluticasone (FLOVENT DISKUS) 50 MCG/BLIST diskus inhaler Inhale 1 puff into the lungs 2 (two) times daily. 1 Inhaler 3  . montelukast (SINGULAIR) 10 MG tablet Take 10 mg by mouth at bedtime.    . ondansetron (ZOFRAN) 8 MG tablet Take 1 tablet by mouth every 8 (eight) hours as needed.    . pantoprazole (PROTONIX) 40 MG tablet Take 1 tablet by mouth every morning.    . pravastatin (PRAVACHOL) 10 MG tablet Take 1 tablet by mouth at bedtime.    Marland Kitchen spironolactone (ALDACTONE) 25 MG tablet Take 1 tablet (25 mg total) by  mouth daily. 90 tablet 1  . albuterol (VENTOLIN HFA) 108 (90 Base) MCG/ACT inhaler Inhale 2 puffs into the lungs every 6 (six) hours as needed. (Patient not taking: Reported on 02/05/2018) 1 Inhaler 1  . furosemide (LASIX) 20 MG tablet Take 1 tablet (20 mg total) by mouth daily as needed. (Patient not taking: Reported on 02/05/2018) 30 tablet 2  . magic mouthwash w/lidocaine SOLN Take 5 mLs by mouth 3 (three) times daily. (Patient not taking: Reported on 02/05/2018) 240 mL 0  . POMALYST 4 MG capsule Take 1 capsule by mouth with water every day on days 1-21 every 28 days. (Patient not taking: Reported on 02/05/2018) 21 capsule 0   No current facility-administered medications for this visit.      PHYSICAL EXAMINATION: ECOG PERFORMANCE STATUS: 1 - Symptomatic but completely ambulatory Vitals:   02/05/18 0856 02/05/18 0859  BP:  112/64  Pulse:  74  Resp: 16 16  SpO2:  98%   Filed Weights   02/05/18 0856  Weight: 129 lb (58.5 kg)    Physical Exam  Constitutional: She is oriented to person, place, and time and well-developed, well-nourished, and in no distress. No distress.  HENT:  Head: Normocephalic and atraumatic.  Mouth/Throat: No oropharyngeal exudate.  Eyes: Pupils are equal, round, and reactive to light. EOM are normal. Left eye exhibits no discharge. No scleral icterus.  Neck: Normal range of motion. Neck supple. No JVD present.  Cardiovascular: Normal rate and regular rhythm.  No murmur heard. Pulmonary/Chest: Effort normal. No respiratory distress. She has wheezes.  Decreased breath sounds bilaterally.   Abdominal: Soft. She exhibits no distension. There is no tenderness. There is no rebound.  Musculoskeletal: Normal range of motion. She exhibits no edema.  Lymphadenopathy:    She has no cervical adenopathy.  Neurological: She is alert and oriented to person, place, and time. No cranial nerve deficit.  Skin: Skin is warm and dry. No rash noted. No erythema.  Psychiatric:  Memory, affect and judgment normal.     LABORATORY DATA:  I have reviewed  the data as listed Lab Results  Component Value Date   WBC 2.7 (L) 02/05/2018   HGB 13.0 02/05/2018   HCT 38.1 02/05/2018   MCV 89.1 02/05/2018   PLT 195 02/05/2018   Recent Labs    01/22/18 0821 01/29/18 1056 02/05/18 0826  NA 137 137 137  K 4.3 3.8 3.4*  CL 106 107 105  CO2 '24 23 24  '$ GLUCOSE 103* 97 98  BUN '11 11 10  '$ CREATININE 0.69 0.52 0.66  CALCIUM 8.7* 8.4* 8.7*  GFRNONAA >60 >60 >60  GFRAA >60 >60 >60  PROT 6.8 6.3* 6.3*  ALBUMIN 3.2* 3.1* 3.1*  AST 15 14* 19  ALT 13* 13* 15  ALKPHOS 95 78 68  BILITOT 1.0 1.2 1.0       ASSESSMENT & PLAN:  1. COPD with acute exacerbation ([place])   2. Multiple myeloma in relapse ([place])   3. Encounter for antineoplastic chemotherapy   4. Encounter for antineoplastic immunotherapy    #Multiple myeloma : hold weekly daratumumab, Advise patient not to start Pomalyst given acute COPD exacerbation.  (plan daratumumab 16 mg/m2 weekly for total of 8 weeks, following by Q2 weeks from week 9-24, followed by monthly from week 25 and beyond) AVOID contrast study unless absolutely needed for life threatening condition.    # Cough, pruritic pain: stat CXR non remarkable. Likely COPD exacerbation, Z-Pak and prednisone dose pack, continue use Flovent and albuterol inhaler.    # Neutropenia: non febrile. ANC stable. Count is acceptable to start cycle 2 Pomalyst, however due to acute COPD exacerbation, will hold until she recovers.   Reassess in 3 days given her immunocompromised state due to neutropenia and multiple myeloma.   All questions were answered. The patient knows to call the clinic with any problems questions or concerns.  Return of visit: Reassessment on 4/22.  Total face to face encounter time for this patient visit was 45 min. >50% of the time was  spent in counseling and coordination of care.   Earlie Server, MD, PhD Hematology Oncology [facility] at [facility] Pager- [phone] 02/05/2018

## 2018-02-05 NOTE — Progress Notes (Signed)
Patient is here today for a follow up. Patient complains of head congestion, fatigue, and semi-productive cough - patient also complains of right ear pain x 1 week.

## 2018-02-05 NOTE — Telephone Encounter (Signed)
Prednisone did not get sent to pharmacy

## 2018-02-09 ENCOUNTER — Inpatient Hospital Stay (HOSPITAL_BASED_OUTPATIENT_CLINIC_OR_DEPARTMENT_OTHER): Payer: Medicare PPO | Admitting: Oncology

## 2018-02-09 ENCOUNTER — Encounter: Payer: Self-pay | Admitting: Oncology

## 2018-02-09 VITALS — BP 144/71 | HR 71 | Temp 97.7°F | Wt 131.0 lb

## 2018-02-09 DIAGNOSIS — J441 Chronic obstructive pulmonary disease with (acute) exacerbation: Secondary | ICD-10-CM

## 2018-02-09 DIAGNOSIS — F419 Anxiety disorder, unspecified: Secondary | ICD-10-CM | POA: Diagnosis not present

## 2018-02-09 DIAGNOSIS — C7951 Secondary malignant neoplasm of bone: Secondary | ICD-10-CM | POA: Diagnosis not present

## 2018-02-09 DIAGNOSIS — T451X5S Adverse effect of antineoplastic and immunosuppressive drugs, sequela: Secondary | ICD-10-CM

## 2018-02-09 DIAGNOSIS — R600 Localized edema: Secondary | ICD-10-CM | POA: Diagnosis not present

## 2018-02-09 DIAGNOSIS — Z885 Allergy status to narcotic agent status: Secondary | ICD-10-CM

## 2018-02-09 DIAGNOSIS — E785 Hyperlipidemia, unspecified: Secondary | ICD-10-CM

## 2018-02-09 DIAGNOSIS — Z87891 Personal history of nicotine dependence: Secondary | ICD-10-CM

## 2018-02-09 DIAGNOSIS — D701 Agranulocytosis secondary to cancer chemotherapy: Secondary | ICD-10-CM | POA: Diagnosis not present

## 2018-02-09 DIAGNOSIS — R5381 Other malaise: Secondary | ICD-10-CM

## 2018-02-09 DIAGNOSIS — C9002 Multiple myeloma in relapse: Secondary | ICD-10-CM | POA: Diagnosis not present

## 2018-02-09 DIAGNOSIS — R5383 Other fatigue: Secondary | ICD-10-CM | POA: Diagnosis not present

## 2018-02-09 DIAGNOSIS — G8929 Other chronic pain: Secondary | ICD-10-CM

## 2018-02-09 DIAGNOSIS — G629 Polyneuropathy, unspecified: Secondary | ICD-10-CM | POA: Diagnosis not present

## 2018-02-09 DIAGNOSIS — K219 Gastro-esophageal reflux disease without esophagitis: Secondary | ICD-10-CM

## 2018-02-09 DIAGNOSIS — Z8042 Family history of malignant neoplasm of prostate: Secondary | ICD-10-CM

## 2018-02-09 DIAGNOSIS — J209 Acute bronchitis, unspecified: Secondary | ICD-10-CM | POA: Diagnosis not present

## 2018-02-09 DIAGNOSIS — Z7982 Long term (current) use of aspirin: Secondary | ICD-10-CM

## 2018-02-09 DIAGNOSIS — Z79899 Other long term (current) drug therapy: Secondary | ICD-10-CM

## 2018-02-09 DIAGNOSIS — Z5112 Encounter for antineoplastic immunotherapy: Secondary | ICD-10-CM | POA: Diagnosis not present

## 2018-02-09 DIAGNOSIS — Z87311 Personal history of (healed) other pathological fracture: Secondary | ICD-10-CM

## 2018-02-09 DIAGNOSIS — I10 Essential (primary) hypertension: Secondary | ICD-10-CM

## 2018-02-09 DIAGNOSIS — M549 Dorsalgia, unspecified: Secondary | ICD-10-CM

## 2018-02-09 NOTE — Progress Notes (Signed)
Hematology/Oncology Follow up note [facility] [phone] [phone]   Patient Care Team: McLean-Scocuzza, Nino Glow, MD as PCP - General (Internal Medicine)  REFERRING PROVIDER: Dr. Baruch Gouty, Donette  REASON FOR VISIT Follow up for treatment of MM  HISTORY OF PRESENTING ILLNESS:  Cassie Jones is a  72 y.o.  female with PMH listed below who was referred to me for evaluation of multiple myeloma.  Patient used to follow-up with local oncologist Dr. Tonia Brooms and [facility] oncologist Dr. Amalia Hailey.  Patient tells me that as her husband works every day and has a busy working schedule, she is not able to get transportation to her treatment.  As an alternative she is now living with her brother who is close to [facility] [place] and want to transfer care here. Extensive medical record review was performed.    Patient was diagnosed with IgG lambda, ISS2, t (4,14) multiple myeloma on February 03, 2017, she has hemoglobin of 9, calcium 8.9, LDH 132, beta microglobulin 4.6, albumin 3.2.  Skeletal survey negative.  PET scan with single nonspecific humeral lesion and a thyroid nodule.  Marrow with 60% plasmacytosis by CD138 IHC, FISH with hyperdiploidy t(4, 14), 1 q. gain.. Per note, she was treated with CyborD (M spike 3.6 at start)  from 01/2017 to 02/2017. Her treatment was switched to Revlimid Velcade dexamethasone from 02/2017, M spike 1.2 at start,and was found to have progression of disease in 09/2017, when her M spike increased to 1.8, and developed compression fracture in 10/2016. She was restarted on RVD using weekly Velcade.  She was recently seen by Dr. Boris Sharper and recommended to start on a combination of daratumumab, Pomalyst, dexamethasone. Per patient she received her first daratumumab last week with split dose '8mg'$ /kg given on 3/14 and 3/15.  Patient reports that she tolerate the treatment well except mild infusion reaction.  # Patient  also reports that she has got dental clearance and has been given 1 dose of Zometa by Dr. Remus Loffler (unkown dates).  Autologous transplant has been discussed with patient at [facility] and patient has not decided.  # Patient was seen by nurse practitioner last week prior to her cycle 2 daratumumab.  She reports feeling shortness of breath, having cough, believed to have bronchitis and was prescribed Levaquin 500 mg p.o. daily for 7 days, and albuterol as needed.  She also had a VQ scan low probability for pulmonary embolism.    INTERVAL HISTORY Cassie Jones is a 72 y.o. female who has above history reviewed by me today presents for follow up visit for management of multiple myeloma. She feels congested at last visit so that Daratumumab and pomalyst were held. She was started on Z-pak and prednisone dose-pak. She feels much better, no long wheezing, still has a cough. Denies SOB more than baseline.  .    Current Treatment  # 3/14, 3/15 at outside facility, she got split dose of daratumumab. #  01/08/2018 Start on weekly daratumumab '16mg'$ /m2, dexamethasone '20mg'$ ,  01/08/2018 Pomalyst '4mg'$  Day 1-21 ogf 28 day cycle.  # Palliative RT to spine.   Current Pain medication: Oxycodone '5mg'$  Q8h as needed. Oxycontin '10mg'$  Q12h.   Review of Systems  Constitutional: Positive for malaise/fatigue. Negative for chills, fever and weight loss.  HENT: Negative for congestion, ear discharge, ear pain, hearing loss and nosebleeds.   Eyes: Negative for blurred vision, double vision, photophobia and pain.  Respiratory: Positive for cough, sputum production and wheezing. Negative for hemoptysis  and shortness of breath.   Cardiovascular: Positive for leg swelling. Negative for chest pain, palpitations, orthopnea and claudication.       Chest wall pain while coughing.   Gastrointestinal: Negative for abdominal pain, blood in stool, constipation, diarrhea, heartburn, nausea and vomiting.  Genitourinary: Negative for dysuria,  flank pain, frequency, hematuria and urgency.  Musculoskeletal: Positive for back pain. Negative for joint pain, myalgias and neck pain.  Skin: Negative for rash.  Neurological: Negative for dizziness, tingling, tremors, sensory change, speech change, weakness and headaches.  Endo/Heme/Allergies: Negative for environmental allergies. Does not bruise/bleed easily.  Psychiatric/Behavioral: Negative for depression, hallucinations, substance abuse and suicidal ideas. The patient is not nervous/anxious.     MEDICAL HISTORY:  Past Medical History:  Diagnosis Date  . Ascites   . Asthma   . Bilateral leg edema   . Cancer ([place])   . Chicken pox   . Colon polyps   . Diverticulitis    with perforation 02/2017 and hosp with colostomy bag s/p removal   . GERD (gastroesophageal reflux disease)   . Hyperlipidemia   . Hypertension   . Multiple myeloma ([place])    dx'ed 01/2018 follows Dr. Tasia Catchings and [facility] H/O   . Pleural effusion   . Thyroid nodule   . UTI (urinary tract infection)   . Vitamin D deficiency     SURGICAL HISTORY: Past Surgical History:  Procedure Laterality Date  . ABDOMINAL HYSTERECTOMY    . BREAST BIOPSY Bilateral yrs ago   benign  . CHOLECYSTECTOMY    . COLOSTOMY  2018  . COLOSTOMY REVERSAL     11 2018  . KYPHOPLASTY     11/2017 [place]     SOCIAL HISTORY: Social History   Socioeconomic History  . Marital status: Married    Spouse name: Not on file  . Number of children: Not on file  . Years of education: Not on file  . Highest education level: Not on file  Occupational History  . Not on file  Social Needs  . Financial resource strain: Not on file  . Food insecurity:    Worry: Not on file    Inability: Not on file  . Transportation needs:    Medical: Not on file    Non-medical: Not on file  Tobacco Use  . Smoking status: Former Research scientist (life sciences)  . Smokeless tobacco: Never Used  Substance and Sexual Activity  . Alcohol use: Not Currently  . Drug use: Never  .  Sexual activity: Yes    Comment: husband   Lifestyle  . Physical activity:    Days per week: Not on file    Minutes per session: Not on file  . Stress: Not on file  Relationships  . Social connections:    Talks on phone: Not on file    Gets together: Not on file    Attends religious service: Not on file    Active member of club or organization: Not on file    Attends meetings of clubs or organizations: Not on file    Relationship status: Not on file  . Intimate partner violence:    Fear of current or ex partner: Not on file    Emotionally abused: Not on file    Physically abused: Not on file    Forced sexual activity: Not on file  Other Topics Concern  . Not on file  Social History Narrative   Married    Husband lives in [place] while she gets treatment in  this area    She lives alone in this area with family close by     FAMILY HISTORY: Family History  Problem Relation Age of Onset  . Breast cancer Mother 61  . Cancer Mother        breast   . Diabetes Mother   . Heart disease Mother   . Hyperlipidemia Mother   . Hypertension Mother   . Birth defects Brother   . Diabetes Brother   . Heart disease Brother   . Hyperlipidemia Brother   . Cancer Father        prostate met to liver     ALLERGIES:  is allergic to codeine.  MEDICATIONS:  Current Outpatient Medications  Medication Sig Dispense Refill  . albuterol (VENTOLIN HFA) 108 (90 Base) MCG/ACT inhaler Inhale 2 puffs into the lungs every 6 (six) hours as needed. (Patient not taking: Reported on 02/05/2018) 1 Inhaler 1  . aspirin EC 81 MG tablet Take 1 tablet (81 mg total) by mouth daily. 90 tablet 3  . azithromycin (ZITHROMAX) 250 MG tablet Take 2 tablet on day 1 and then 1 tab daily 6 each 0  . erythromycin (ROMYCIN) ophthalmic ointment Place 1 application into the left eye 3 (three) times daily. Left eye only x 1 week 3.5 g 0  . fluticasone (FLOVENT DISKUS) 50 MCG/BLIST diskus inhaler Inhale 1 puff into the  lungs 2 (two) times daily. 1 Inhaler 3  . furosemide (LASIX) 20 MG tablet Take 1 tablet (20 mg total) by mouth daily as needed. (Patient not taking: Reported on 02/05/2018) 30 tablet 2  . magic mouthwash w/lidocaine SOLN Take 5 mLs by mouth 3 (three) times daily. (Patient not taking: Reported on 02/05/2018) 240 mL 0  . montelukast (SINGULAIR) 10 MG tablet Take 10 mg by mouth at bedtime.    . ondansetron (ZOFRAN) 8 MG tablet Take 1 tablet by mouth every 8 (eight) hours as needed.    . pantoprazole (PROTONIX) 40 MG tablet Take 1 tablet by mouth every morning.    Marland Kitchen POMALYST 4 MG capsule Take 1 capsule by mouth with water every day on days 1-21 every 28 days. (Patient not taking: Reported on 02/05/2018) 21 capsule 0  . pravastatin (PRAVACHOL) 10 MG tablet Take 1 tablet by mouth at bedtime.    . predniSONE (STERAPRED UNI-PAK 21 TAB) 10 MG (21) TBPK tablet Take  5 tabs on day 1 4 tabs on day 2 3 tabs on day 3 2 tabs on day 4 1 tab on day 5 15 tablet 0  . spironolactone (ALDACTONE) 25 MG tablet Take 1 tablet (25 mg total) by mouth daily. 90 tablet 1   No current facility-administered medications for this visit.      PHYSICAL EXAMINATION: ECOG PERFORMANCE STATUS: 1 - Symptomatic but completely ambulatory Vitals:   02/10/18 0916  BP: (!) 144/71  Pulse: 71  Temp: 97.7 F (36.5 C)   Filed Weights   02/10/18 0916  Weight: 131 lb (59.4 kg)    Physical Exam  Constitutional: She is oriented to person, place, and time and well-developed, well-nourished, and in no distress. No distress.  HENT:  Head: Normocephalic and atraumatic.  Nose: Nose normal.  Mouth/Throat: Oropharynx is clear and moist. No oropharyngeal exudate.  Eyes: Pupils are equal, round, and reactive to light. EOM are normal. Left eye exhibits no discharge. No scleral icterus.  Neck: Normal range of motion. Neck supple. No JVD present. No tracheal deviation present.  Cardiovascular: Normal rate and  regular rhythm.  No murmur  heard. Pulmonary/Chest: Effort normal. No respiratory distress. She has no wheezes. She has no rales.  Decreased breath sounds bilaterally.   Abdominal: Soft. She exhibits no distension. There is no tenderness. There is no rebound and no guarding.  Musculoskeletal: Normal range of motion. She exhibits no edema.  Lymphadenopathy:    She has no cervical adenopathy.  Neurological: She is alert and oriented to person, place, and time. No cranial nerve deficit.  Skin: Skin is warm and dry. No rash noted. No erythema. No pallor.  Psychiatric: Memory, affect and judgment normal.     LABORATORY DATA:  I have reviewed the data as listed Lab Results  Component Value Date   WBC 2.7 (L) 02/05/2018   HGB 13.0 02/05/2018   HCT 38.1 02/05/2018   MCV 89.1 02/05/2018   PLT 195 02/05/2018   Recent Labs    01/22/18 0821 01/29/18 1056 02/05/18 0826  NA 137 137 137  K 4.3 3.8 3.4*  CL 106 107 105  CO2 '24 23 24  '$ GLUCOSE 103* 97 98  BUN '11 11 10  '$ CREATININE 0.69 0.52 0.66  CALCIUM 8.7* 8.4* 8.7*  GFRNONAA >60 >60 >60  GFRAA >60 >60 >60  PROT 6.8 6.3* 6.3*  ALBUMIN 3.2* 3.1* 3.1*  AST 15 14* 19  ALT 13* 13* 15  ALKPHOS 95 78 68  BILITOT 1.0 1.2 1.0       ASSESSMENT & PLAN:  1. Multiple myeloma in relapse ([place])   2. COPD with acute exacerbation ([place])   3. Bone metastasis ([place])   4. Neuropathy    #Multiple myeloma : hold weekly daratumumab and pomalyst this week.   (plan daratumumab 16 mg/m2 weekly for total of 8 weeks, following by Q2 weeks from week 9-24, followed by monthly from week 25 and beyond) AVOID contrast study unless absolutely needed for life threatening condition.    # Cough, pruritic pain: improving on Z-Pak and prednisone dose pack, continue use Flovent and albuterol inhaler.    # Neutropenia: non febrile. Anticipate to improve while off Pomalyst.   # plan follow up in one week to start on Daratumumab and Cycle 2 Pomalyst.  All questions were answered. The patient  knows to call the clinic with any problems questions or concerns.  Return of visit: 1 week  Earlie Server, MD, PhD Hematology Oncology [facility] Pager- [phone] 02/09/2018

## 2018-02-11 ENCOUNTER — Encounter: Payer: Self-pay | Admitting: *Deleted

## 2018-02-16 ENCOUNTER — Ambulatory Visit
Admission: RE | Admit: 2018-02-16 | Discharge: 2018-02-16 | Disposition: A | Discharge status: Home / Self Care | Payer: Medicare PPO | Source: Ambulatory Visit | Attending: Radiation Oncology | Admitting: Radiation Oncology

## 2018-02-16 ENCOUNTER — Encounter: Payer: Self-pay | Admitting: Oncology

## 2018-02-16 ENCOUNTER — Ambulatory Visit
Admission: RE | Admit: 2018-02-16 | Discharge: 2018-02-16 | Disposition: A | Discharge status: Home / Self Care | Payer: Medicare PPO | Source: Ambulatory Visit | Attending: Oncology | Admitting: Oncology

## 2018-02-16 ENCOUNTER — Inpatient Hospital Stay: Payer: Medicare PPO

## 2018-02-16 ENCOUNTER — Other Ambulatory Visit: Payer: Self-pay | Admitting: *Deleted

## 2018-02-16 ENCOUNTER — Other Ambulatory Visit: Payer: Self-pay

## 2018-02-16 ENCOUNTER — Inpatient Hospital Stay (HOSPITAL_BASED_OUTPATIENT_CLINIC_OR_DEPARTMENT_OTHER): Payer: Medicare PPO | Admitting: Oncology

## 2018-02-16 ENCOUNTER — Encounter: Payer: Self-pay | Admitting: Radiation Oncology

## 2018-02-16 VITALS — BP 121/79 | HR 80

## 2018-02-16 VITALS — BP 158/78 | HR 72 | Temp 96.9°F | Resp 18 | Wt 132.6 lb

## 2018-02-16 VITALS — BP 137/76 | HR 75 | Temp 97.6°F | Resp 18 | Wt 133.1 lb

## 2018-02-16 DIAGNOSIS — C7951 Secondary malignant neoplasm of bone: Secondary | ICD-10-CM | POA: Diagnosis not present

## 2018-02-16 DIAGNOSIS — C9002 Multiple myeloma in relapse: Secondary | ICD-10-CM

## 2018-02-16 DIAGNOSIS — D701 Agranulocytosis secondary to cancer chemotherapy: Secondary | ICD-10-CM

## 2018-02-16 DIAGNOSIS — C9000 Multiple myeloma not having achieved remission: Secondary | ICD-10-CM | POA: Insufficient documentation

## 2018-02-16 DIAGNOSIS — F419 Anxiety disorder, unspecified: Secondary | ICD-10-CM | POA: Diagnosis not present

## 2018-02-16 DIAGNOSIS — Z923 Personal history of irradiation: Secondary | ICD-10-CM | POA: Insufficient documentation

## 2018-02-16 DIAGNOSIS — K219 Gastro-esophageal reflux disease without esophagitis: Secondary | ICD-10-CM

## 2018-02-16 DIAGNOSIS — Z792 Long term (current) use of antibiotics: Secondary | ICD-10-CM

## 2018-02-16 DIAGNOSIS — J441 Chronic obstructive pulmonary disease with (acute) exacerbation: Secondary | ICD-10-CM | POA: Diagnosis not present

## 2018-02-16 DIAGNOSIS — Z7982 Long term (current) use of aspirin: Secondary | ICD-10-CM

## 2018-02-16 DIAGNOSIS — R05 Cough: Secondary | ICD-10-CM

## 2018-02-16 DIAGNOSIS — R5383 Other fatigue: Secondary | ICD-10-CM

## 2018-02-16 DIAGNOSIS — Z8042 Family history of malignant neoplasm of prostate: Secondary | ICD-10-CM

## 2018-02-16 DIAGNOSIS — Z885 Allergy status to narcotic agent status: Secondary | ICD-10-CM

## 2018-02-16 DIAGNOSIS — R5381 Other malaise: Secondary | ICD-10-CM

## 2018-02-16 DIAGNOSIS — Z5112 Encounter for antineoplastic immunotherapy: Secondary | ICD-10-CM | POA: Diagnosis not present

## 2018-02-16 DIAGNOSIS — M7989 Other specified soft tissue disorders: Secondary | ICD-10-CM | POA: Insufficient documentation

## 2018-02-16 DIAGNOSIS — T451X5S Adverse effect of antineoplastic and immunosuppressive drugs, sequela: Secondary | ICD-10-CM

## 2018-02-16 DIAGNOSIS — G629 Polyneuropathy, unspecified: Secondary | ICD-10-CM

## 2018-02-16 DIAGNOSIS — I82621 Acute embolism and thrombosis of deep veins of right upper extremity: Secondary | ICD-10-CM | POA: Insufficient documentation

## 2018-02-16 DIAGNOSIS — Z79899 Other long term (current) drug therapy: Secondary | ICD-10-CM

## 2018-02-16 DIAGNOSIS — M549 Dorsalgia, unspecified: Secondary | ICD-10-CM

## 2018-02-16 DIAGNOSIS — G8929 Other chronic pain: Secondary | ICD-10-CM | POA: Diagnosis not present

## 2018-02-16 DIAGNOSIS — Z5111 Encounter for antineoplastic chemotherapy: Secondary | ICD-10-CM

## 2018-02-16 DIAGNOSIS — Z87891 Personal history of nicotine dependence: Secondary | ICD-10-CM

## 2018-02-16 DIAGNOSIS — Z87311 Personal history of (healed) other pathological fracture: Secondary | ICD-10-CM

## 2018-02-16 DIAGNOSIS — I10 Essential (primary) hypertension: Secondary | ICD-10-CM

## 2018-02-16 DIAGNOSIS — E785 Hyperlipidemia, unspecified: Secondary | ICD-10-CM

## 2018-02-16 LAB — COMPREHENSIVE METABOLIC PANEL
ALBUMIN: 3.3 g/dL — AB (ref 3.5–5.0)
ALK PHOS: 75 U/L (ref 38–126)
ALT: 15 U/L (ref 14–54)
ANION GAP: 10 (ref 5–15)
AST: 19 U/L (ref 15–41)
BUN: 14 mg/dL (ref 6–20)
CALCIUM: 9.2 mg/dL (ref 8.9–10.3)
CHLORIDE: 101 mmol/L (ref 101–111)
CO2: 27 mmol/L (ref 22–32)
Creatinine, Ser: 0.71 mg/dL (ref 0.44–1.00)
GFR calc non Af Amer: >60 mL/min (ref >60)
GLUCOSE: 109 mg/dL — AB (ref 65–99)
POTASSIUM: 3.7 mmol/L (ref 3.5–5.1)
SODIUM: 138 mmol/L (ref 135–145)
Total Bilirubin: 1.6 mg/dL — ABNORMAL HIGH (ref 0.3–1.2)
Total Protein: 6.5 g/dL (ref 6.5–8.1)

## 2018-02-16 LAB — CBC WITH DIFFERENTIAL/PLATELET
BASOS PCT: 1 %
Basophils Absolute: 0.1 10*3/uL (ref 0–0.1)
Eosinophils Absolute: 0.2 10*3/uL (ref 0–0.7)
Eosinophils Relative: 3 %
HEMATOCRIT: 37.2 % (ref 35.0–47.0)
HEMOGLOBIN: 12.8 g/dL (ref 12.0–16.0)
LYMPHS PCT: 15 %
Lymphs Abs: 0.9 10*3/uL — ABNORMAL LOW (ref 1.0–3.6)
MCH: 30.9 pg (ref 26.0–34.0)
MCHC: 34.5 g/dL (ref 32.0–36.0)
MCV: 89.7 fL (ref 80.0–100.0)
MONOS PCT: 15 %
Monocytes Absolute: 1 10*3/uL — ABNORMAL HIGH (ref 0.2–0.9)
NEUTROS ABS: 4.1 10*3/uL (ref 1.4–6.5)
NEUTROS PCT: 66 %
Platelets: 202 10*3/uL (ref 150–440)
RBC: 4.15 MIL/uL (ref 3.80–5.20)
RDW: 15.8 % — ABNORMAL HIGH (ref 11.5–14.5)
WBC: 6.3 10*3/uL (ref 3.6–11.0)

## 2018-02-16 MED ORDER — ONDANSETRON HCL 4 MG PO TABS
8.0000 mg | ORAL_TABLET | Freq: Once | ORAL | Status: AC
  Administered 2018-02-16: 8 mg via ORAL
  Filled 2018-02-16: qty 2

## 2018-02-16 MED ORDER — SODIUM CHLORIDE 0.9 % IV SOLN
20.0000 mg | Freq: Once | INTRAVENOUS | Status: AC
  Administered 2018-02-16: 20 mg via INTRAVENOUS
  Filled 2018-02-16: qty 2

## 2018-02-16 MED ORDER — SODIUM CHLORIDE 0.9 % IV SOLN
Freq: Once | INTRAVENOUS | Status: AC
  Administered 2018-02-16: 11:00:00 via INTRAVENOUS
  Filled 2018-02-16: qty 1000

## 2018-02-16 MED ORDER — SODIUM CHLORIDE 0.9 % IV SOLN
900.0000 mg | Freq: Once | INTRAVENOUS | Status: AC
  Administered 2018-02-16: 900 mg via INTRAVENOUS
  Filled 2018-02-16: qty 40

## 2018-02-16 MED ORDER — DIPHENHYDRAMINE HCL 25 MG PO CAPS
50.0000 mg | ORAL_CAPSULE | Freq: Once | ORAL | Status: AC
  Administered 2018-02-16: 50 mg via ORAL
  Filled 2018-02-16: qty 2

## 2018-02-16 MED ORDER — PROCHLORPERAZINE MALEATE 10 MG PO TABS: 10.0000 mg | ORAL_TABLET | Freq: Once | ORAL | Status: DC

## 2018-02-16 MED ORDER — ACETAMINOPHEN 325 MG PO TABS
650.0000 mg | ORAL_TABLET | Freq: Once | ORAL | Status: AC
  Administered 2018-02-16: 650 mg via ORAL
  Filled 2018-02-16: qty 2

## 2018-02-16 MED ORDER — SODIUM CHLORIDE 0.9 % IV SOLN: 900.0000 mg | Freq: Once | INTRAVENOUS | Status: DC

## 2018-02-16 MED ORDER — APIXABAN 5 MG PO TABS: ORAL_TABLET | ORAL | 0 refills | Status: DC

## 2018-02-16 NOTE — Progress Notes (Signed)
Hematology/Oncology Follow up note [facility] [phone] [phone]   Patient Care Team: McLean-Scocuzza, Nino Glow, MD as PCP - General (Internal Medicine)  REFERRING PROVIDER: Dr. Baruch Gouty, Donette  REASON FOR VISIT Follow up for treatment of MM  HISTORY OF PRESENTING ILLNESS:  Cassie Jones is a  72 y.o.  female with PMH listed below who was referred to me for evaluation of multiple myeloma.  Patient used to follow-up with local oncologist Dr. Tonia Brooms and [facility] oncologist Dr. Amalia Hailey.  Patient tells me that as her husband works every day and has a busy working schedule, she is not able to get transportation to her treatment.  As an alternative she is now living with her brother who is close to [facility] [place] and want to transfer care here. Extensive medical record review was performed.    Patient was diagnosed with IgG lambda, ISS2, t (4,14) multiple myeloma on February 03, 2017, she has hemoglobin of 9, calcium 8.9, LDH 132, beta microglobulin 4.6, albumin 3.2.  Skeletal survey negative.  PET scan with single nonspecific humeral lesion and a thyroid nodule.  Marrow with 60% plasmacytosis by CD138 IHC, FISH with hyperdiploidy t(4, 14), 1 q. gain.. Per note, she was treated with CyborD (M spike 3.6 at start)  from 01/2017 to 02/2017. Her treatment was switched to Revlimid Velcade dexamethasone from 02/2017, M spike 1.2 at start,and was found to have progression of disease in 09/2017, when her M spike increased to 1.8, and developed compression fracture in 10/2016. She was restarted on RVD using weekly Velcade.  She was recently seen by Dr. Boris Sharper and recommended to start on a combination of daratumumab, Pomalyst, dexamethasone. Per patient she received her first daratumumab last week with split dose '8mg'$ /kg given on 3/14 and 3/15.  Patient reports that she tolerate the treatment well except mild infusion reaction.  # Patient  also reports that she has got dental clearance and has been given 1 dose of Zometa by Dr. Remus Loffler (unkown dates).  Autologous transplant has been discussed with patient at [facility] and patient has not decided.  # Patient was seen by nurse practitioner last week prior to her cycle 2 daratumumab.  She reports feeling shortness of breath, having cough, believed to have bronchitis and was prescribed Levaquin 500 mg p.o. daily for 7 days, and albuterol as needed.  She also had a VQ scan low probability for pulmonary embolism.    INTERVAL HISTORY Cassie Jones is a 72 y.o. female who has above history reviewed by me today presents for follow up visit for management of multiple myeloma.  Her daratumumab has been held for 2 weeks due to acute bronchitis.  She finished a course of Z-Pak and prednisone tapering course.  Continue to have chronic cough occasionally, much better comparing to previous. She reports 2 -3 days right upper extremity elbow soft tissue swelling and pain.  Denies any fever or chills.  Denies any injury.   .    Current Treatment  # 3/14, 3/15 at outside facility, she got split dose of daratumumab. #  01/08/2018 Start on weekly daratumumab '16mg'$ /m2, dexamethasone '20mg'$ ,  01/08/2018 Pomalyst '4mg'$  Day 1-21 ogf 28 day cycle.  # Palliative RT to spine.   Current Pain medication: Oxycodone '5mg'$  Q8h as needed. Oxycontin '10mg'$  Q12h.   Review of Systems  Constitutional: Positive for malaise/fatigue. Negative for chills, fever and weight loss.  HENT: Negative for congestion, ear discharge, ear pain, hearing loss, nosebleeds, sinus  pain and sore throat.   Eyes: Negative for blurred vision, double vision, photophobia, pain, discharge and redness.  Respiratory: Positive for cough. Negative for hemoptysis, sputum production, shortness of breath and wheezing.   Cardiovascular: Negative for chest pain, palpitations, orthopnea, claudication and leg swelling.       Chest wall pain while coughing.     Gastrointestinal: Negative for abdominal pain, blood in stool, constipation, diarrhea, heartburn, melena, nausea and vomiting.  Genitourinary: Negative for dysuria, flank pain, frequency, hematuria and urgency.  Musculoskeletal: Positive for back pain. Negative for joint pain, myalgias and neck pain.       Right elbow swelling and pain.  Skin: Negative for itching and rash.  Neurological: Negative for dizziness, tingling, tremors, sensory change, speech change, focal weakness, weakness and headaches.  Endo/Heme/Allergies: Negative for environmental allergies. Does not bruise/bleed easily.  Psychiatric/Behavioral: Negative for depression, hallucinations, substance abuse and suicidal ideas. The patient is not nervous/anxious.     MEDICAL HISTORY:  Past Medical History:  Diagnosis Date  . Ascites   . Asthma   . Bilateral leg edema   . Cancer ([place])   . Chicken pox   . Colon polyps   . Diverticulitis    with perforation 02/2017 and hosp with colostomy bag s/p removal   . GERD (gastroesophageal reflux disease)   . Hyperlipidemia   . Hypertension   . Multiple myeloma ([place])    dx'ed 01/2018 follows Dr. Tasia Catchings and [facility]   . Pleural effusion   . Thyroid nodule   . UTI (urinary tract infection)   . Vitamin D deficiency     SURGICAL HISTORY: Past Surgical History:  Procedure Laterality Date  . ABDOMINAL HYSTERECTOMY    . BREAST BIOPSY Bilateral yrs ago   benign  . CHOLECYSTECTOMY    . COLOSTOMY  2018  . COLOSTOMY REVERSAL     11 2018  . KYPHOPLASTY     11/2017 [place]     SOCIAL HISTORY: Social History   Socioeconomic History  . Marital status: Married    Spouse name: Not on file  . Number of children: Not on file  . Years of education: Not on file  . Highest education level: Not on file  Occupational History  . Not on file  Social Needs  . Financial resource strain: Not on file  . Food insecurity:    Worry: Not on file    Inability: Not on file  . Transportation  needs:    Medical: Not on file    Non-medical: Not on file  Tobacco Use  . Smoking status: Former Research scientist (life sciences)  . Smokeless tobacco: Never Used  Substance and Sexual Activity  . Alcohol use: Not Currently  . Drug use: Never  . Sexual activity: Yes    Comment: husband   Lifestyle  . Physical activity:    Days per week: Not on file    Minutes per session: Not on file  . Stress: Not on file  Relationships  . Social connections:    Talks on phone: Not on file    Gets together: Not on file    Attends religious service: Not on file    Active member of club or organization: Not on file    Attends meetings of clubs or organizations: Not on file    Relationship status: Not on file  . Intimate partner violence:    Fear of current or ex partner: Not on file    Emotionally abused: Not on file  Physically abused: Not on file    Forced sexual activity: Not on file  Other Topics Concern  . Not on file  Social History Narrative   Married    Husband lives in [place] while she gets treatment in this area    She lives alone in this area with family close by     FAMILY HISTORY: Family History  Problem Relation Age of Onset  . Breast cancer Mother 39  . Cancer Mother        breast   . Diabetes Mother   . Heart disease Mother   . Hyperlipidemia Mother   . Hypertension Mother   . Birth defects Brother   . Diabetes Brother   . Heart disease Brother   . Hyperlipidemia Brother   . Cancer Father        prostate met to liver     ALLERGIES:  is allergic to codeine.  MEDICATIONS:  Current Outpatient Medications  Medication Sig Dispense Refill  . albuterol (VENTOLIN HFA) 108 (90 Base) MCG/ACT inhaler Inhale 2 puffs into the lungs every 6 (six) hours as needed. (Patient not taking: Reported on 02/05/2018) 1 Inhaler 1  . aspirin EC 81 MG tablet Take 1 tablet (81 mg total) by mouth daily. 90 tablet 3  . azithromycin (ZITHROMAX) 250 MG tablet Take 2 tablet on day 1 and then 1 tab daily  (Patient not taking: Reported on 02/16/2018) 6 each 0  . erythromycin (ROMYCIN) ophthalmic ointment Place 1 application into the left eye 3 (three) times daily. Left eye only x 1 week 3.5 g 0  . fluticasone (FLOVENT DISKUS) 50 MCG/BLIST diskus inhaler Inhale 1 puff into the lungs 2 (two) times daily. 1 Inhaler 3  . furosemide (LASIX) 20 MG tablet Take 1 tablet (20 mg total) by mouth daily as needed. (Patient not taking: Reported on 02/05/2018) 30 tablet 2  . magic mouthwash w/lidocaine SOLN Take 5 mLs by mouth 3 (three) times daily. (Patient not taking: Reported on 02/05/2018) 240 mL 0  . montelukast (SINGULAIR) 10 MG tablet Take 10 mg by mouth at bedtime.    . ondansetron (ZOFRAN) 8 MG tablet Take 1 tablet by mouth every 8 (eight) hours as needed.    . pantoprazole (PROTONIX) 40 MG tablet Take 1 tablet by mouth every morning.    Marland Kitchen POMALYST 4 MG capsule Take 1 capsule by mouth with water every day on days 1-21 every 28 days. (Patient not taking: Reported on 02/05/2018) 21 capsule 0  . pravastatin (PRAVACHOL) 10 MG tablet Take 1 tablet by mouth at bedtime.    Marland Kitchen spironolactone (ALDACTONE) 25 MG tablet Take 1 tablet (25 mg total) by mouth daily. 90 tablet 1   No current facility-administered medications for this visit.      PHYSICAL EXAMINATION: ECOG PERFORMANCE STATUS: 1 - Symptomatic but completely ambulatory Vitals:   02/16/18 0915  BP: 137/76  Pulse: 75  Resp: 18  Temp: 97.6 F (36.4 C)   Filed Weights   02/16/18 0915  Weight: 133 lb 1.6 oz (60.4 kg)    Physical Exam  Constitutional: She is oriented to person, place, and time and well-developed, well-nourished, and in no distress. No distress.  HENT:  Head: Normocephalic and atraumatic.  Nose: Nose normal.  Mouth/Throat: Oropharynx is clear and moist. No oropharyngeal exudate.  Eyes: Pupils are equal, round, and reactive to light. EOM are normal. Left eye exhibits no discharge. No scleral icterus.  Neck: Normal range of motion.  Neck supple.  No JVD present. No tracheal deviation present.  Cardiovascular: Normal rate, regular rhythm and normal heart sounds.  No murmur heard. Pulmonary/Chest: Effort normal and breath sounds normal. No respiratory distress. She has no wheezes. She has no rales. She exhibits no tenderness.  Decreased breath sounds bilaterally.   Abdominal: Soft. She exhibits no distension and no mass. There is no tenderness. There is no rebound and no guarding.  Musculoskeletal: Normal range of motion. She exhibits no edema or tenderness.  Right upper extremity elbow soft tissue swelling and tenderness.  No bruising.  Range of motion normal.  Lymphadenopathy:    She has no cervical adenopathy.  Neurological: She is alert and oriented to person, place, and time. No cranial nerve deficit. She exhibits normal muscle tone. Coordination normal.  Skin: Skin is warm and dry. No rash noted. She is not diaphoretic. No erythema. No pallor.  Psychiatric: Memory, affect and judgment normal.     LABORATORY DATA:  I have reviewed the data as listed Lab Results  Component Value Date   WBC 6.3 02/16/2018   HGB 12.8 02/16/2018   HCT 37.2 02/16/2018   MCV 89.7 02/16/2018   PLT 202 02/16/2018   Recent Labs    01/29/18 1056 02/05/18 0826 02/16/18 0810  NA 137 137 138  K 3.8 3.4* 3.7  CL 107 105 101  CO2 '23 24 27  '$ GLUCOSE 97 98 109*  BUN '11 10 14  '$ CREATININE 0.52 0.66 0.71  CALCIUM 8.4* 8.7* 9.2  GFRNONAA >60 >60 >60  GFRAA >60 >60 >60  PROT 6.3* 6.3* 6.5  ALBUMIN 3.1* 3.1* 3.3*  AST 14* 19 19  ALT 13* 15 15  ALKPHOS 78 68 75  BILITOT 1.2 1.0 1.6*       ASSESSMENT & PLAN:  1. Multiple myeloma in relapse ([place])   2. COPD with acute exacerbation ([place])   3. Bone metastasis ([place])   4. Neuropathy   5. Encounter for antineoplastic chemotherapy   6. Swelling of right upper extremity    #Multiple myeloma : Proceed with 6 th weekly daratumumab.  Advised patient to start cycle 3 Pomalyst D1-21 today.   Cycle 2 was interrupted and she ends up only getting 1 week of Pomalyst and 1 treatment of daratumumab. (plan daratumumab 16 mg/m2 weekly for total of 8 weeks, following by Q2 weeks from week 9-24, followed by monthly from week 25 and beyond)  # AVOID contrast study unless absolutely needed for life threatening condition.    #Acute bronchitis/COPD exacerbation: Status post Z-Pak and prednisone tapering course. Referred to pulmonology for optimizing of her COPD treatments. # Neutropenia: Resolved.   #Right upper extremity swelling and tenderness: Ultrasound to rule out DVT.  If negative for DVT, advised patient to apply warm compress.  She may take Tylenol or her pain medication as instructed. # plan follow up in one week to  Daratumumab.   All questions were answered. The patient knows to call the clinic with any problems questions or concerns. Total face to face encounter time for this patient visit was 40 min. >50% of the time was  spent in counseling and coordination of care.  Return of visit: 1 week  Earlie Server, MD, PhD Hematology Oncology [facility] Pager- [phone] 02/16/2018

## 2018-02-16 NOTE — Telephone Encounter (Signed)
Ultrasound of right upper extremity positive for DVT.   Notified Dr Tasia Catchings, Per Dr Tasia Catchings prescription for Eliquis sent to patients pharmacy.  Advisied patient of prescription and directions.  Advised pt to stop taking Aspirin.   Patient is scheduled to see Dr Tasia Catchings in 1week for follow up.

## 2018-02-16 NOTE — Progress Notes (Signed)
Radiation Oncology Follow up Note  Name: Cassie Jones   Date:   02/16/2018 MRN:  [id number] DOB: 1946/08/20    This 72 y.o. female presents to the clinic today for one-month follow-up status post palliative radiation therapy to her lumbar spine for multiple myeloma.  REFERRING PROVIDER: McLean-Scocuzza, Olivia Mackie *  HPI: patient is a 72 year old female now out 1 month having completed palliative radiation therapy to her lumbar spine for multiple myeloma..she seen today in routine follow-up and is doing well she's had excellent response as far as pain is concerned.She specifically denies any abdominal complaints or dysuria.he is currently [employer] she is tolerating fairly well.    COMPLICATIONS OF TREATMENT: none  FOLLOW UP COMPLIANCE: keeps appointments   PHYSICAL EXAM:  BP (!) 158/78   Pulse 72   Temp (!) 96.9 F (36.1 C)   Resp 18   Wt 132 lb 9.7 oz (60.2 kg)   BMI 24.25 kg/m  Well-developed well-nourished patient in NAD. HEENT reveals PERLA, EOMI, discs not visualized.  Oral cavity is clear. No oral mucosal lesions are identified. Neck is clear without evidence of cervical or supraclavicular adenopathy. Lungs are clear to A&P. Cardiac examination is essentially unremarkable with regular rate and rhythm without murmur rub or thrill. Abdomen is benign with no organomegaly or masses noted. Motor sensory and DTR levels are equal and symmetric in the upper and lower extremities. Cranial nerves II through XII are grossly intact. Proprioception is intact. No peripheral adenopathy or edema is identified. No motor or sensory levels are noted. Crude visual fields are within normal range.  RADIOLOGY RESULTS: no current films for review  PLAN: at the present time patient is doing well had an excellent palliative response toradiation therapy in her lower back. I'm please were overall progress. I will turn follow-up care over to medical oncology. I would be happy to  reevaluate patient at any time should further palliative ration be indicated.  I would like to take this opportunity to thank you for allowing me to participate in the care of your patient.Noreene Filbert, MD

## 2018-02-16 NOTE — Progress Notes (Signed)
Patient here for follow up. States she that on Sunday she hurt her right shoulder when she was putting her blouse on. Right upper arm with pain to touch.

## 2018-02-16 NOTE — Addendum Note (Signed)
Addended by: Earlie Server on: 02/16/2018 05:50 PM   Modules accepted: Orders

## 2018-02-19 ENCOUNTER — Other Ambulatory Visit: Payer: Self-pay | Admitting: *Deleted

## 2018-02-21 ENCOUNTER — Inpatient Hospital Stay
Admission: EM | Admit: 2018-02-21 | Discharge: 2018-02-25 | DRG: 872 | Disposition: A | Discharge status: Home Health | Payer: Medicare PPO | Attending: Specialist | Admitting: Specialist

## 2018-02-21 ENCOUNTER — Other Ambulatory Visit: Payer: Self-pay

## 2018-02-21 ENCOUNTER — Emergency Department: Payer: Medicare PPO

## 2018-02-21 DIAGNOSIS — Z7901 Long term (current) use of anticoagulants: Secondary | ICD-10-CM

## 2018-02-21 DIAGNOSIS — E876 Hypokalemia: Secondary | ICD-10-CM | POA: Diagnosis present

## 2018-02-21 DIAGNOSIS — E785 Hyperlipidemia, unspecified: Secondary | ICD-10-CM | POA: Diagnosis present

## 2018-02-21 DIAGNOSIS — Z86718 Personal history of other venous thrombosis and embolism: Secondary | ICD-10-CM

## 2018-02-21 DIAGNOSIS — B961 Klebsiella pneumoniae [K. pneumoniae] as the cause of diseases classified elsewhere: Secondary | ICD-10-CM | POA: Diagnosis present

## 2018-02-21 DIAGNOSIS — Z87891 Personal history of nicotine dependence: Secondary | ICD-10-CM

## 2018-02-21 DIAGNOSIS — M8580 Other specified disorders of bone density and structure, unspecified site: Secondary | ICD-10-CM | POA: Diagnosis present

## 2018-02-21 DIAGNOSIS — G8929 Other chronic pain: Secondary | ICD-10-CM | POA: Diagnosis present

## 2018-02-21 DIAGNOSIS — Z9049 Acquired absence of other specified parts of digestive tract: Secondary | ICD-10-CM | POA: Diagnosis not present

## 2018-02-21 DIAGNOSIS — Z8349 Family history of other endocrine, nutritional and metabolic diseases: Secondary | ICD-10-CM | POA: Diagnosis not present

## 2018-02-21 DIAGNOSIS — N3000 Acute cystitis without hematuria: Secondary | ICD-10-CM | POA: Diagnosis not present

## 2018-02-21 DIAGNOSIS — Z66 Do not resuscitate: Secondary | ICD-10-CM | POA: Diagnosis present

## 2018-02-21 DIAGNOSIS — Z803 Family history of malignant neoplasm of breast: Secondary | ICD-10-CM

## 2018-02-21 DIAGNOSIS — Z8249 Family history of ischemic heart disease and other diseases of the circulatory system: Secondary | ICD-10-CM

## 2018-02-21 DIAGNOSIS — C9002 Multiple myeloma in relapse: Secondary | ICD-10-CM | POA: Diagnosis present

## 2018-02-21 DIAGNOSIS — A419 Sepsis, unspecified organism: Secondary | ICD-10-CM | POA: Diagnosis not present

## 2018-02-21 DIAGNOSIS — Z90710 Acquired absence of both cervix and uterus: Secondary | ICD-10-CM

## 2018-02-21 DIAGNOSIS — K219 Gastro-esophageal reflux disease without esophagitis: Secondary | ICD-10-CM | POA: Diagnosis present

## 2018-02-21 DIAGNOSIS — E559 Vitamin D deficiency, unspecified: Secondary | ICD-10-CM | POA: Diagnosis present

## 2018-02-21 DIAGNOSIS — Z833 Family history of diabetes mellitus: Secondary | ICD-10-CM

## 2018-02-21 DIAGNOSIS — M545 Low back pain: Secondary | ICD-10-CM | POA: Diagnosis present

## 2018-02-21 DIAGNOSIS — I10 Essential (primary) hypertension: Secondary | ICD-10-CM | POA: Diagnosis present

## 2018-02-21 DIAGNOSIS — Z86010 Personal history of colonic polyps: Secondary | ICD-10-CM

## 2018-02-21 DIAGNOSIS — Z7951 Long term (current) use of inhaled steroids: Secondary | ICD-10-CM

## 2018-02-21 DIAGNOSIS — N390 Urinary tract infection, site not specified: Secondary | ICD-10-CM | POA: Diagnosis present

## 2018-02-21 LAB — CBC WITH DIFFERENTIAL/PLATELET
BASOS PCT: 0 %
Basophils Absolute: 0 10*3/uL (ref 0–0.1)
EOS ABS: 0.4 10*3/uL (ref 0–0.7)
EOS PCT: 8 %
HCT: 37.6 % (ref 35.0–47.0)
HEMOGLOBIN: 12.6 g/dL (ref 12.0–16.0)
LYMPHS ABS: 0.3 10*3/uL — AB (ref 1.0–3.6)
LYMPHS PCT: 8 %
MCH: 30.3 pg (ref 26.0–34.0)
MCHC: 33.6 g/dL (ref 32.0–36.0)
MCV: 90 fL (ref 80.0–100.0)
MONOS PCT: 10 %
Monocytes Absolute: 0.4 10*3/uL (ref 0.2–0.9)
NEUTROS ABS: 3.2 10*3/uL (ref 1.4–6.5)
NEUTROS PCT: 74 %
PLATELETS: 196 10*3/uL (ref 150–440)
RBC: 4.18 MIL/uL (ref 3.80–5.20)
RDW: 15.8 % — ABNORMAL HIGH (ref 11.5–14.5)
WBC: 4.3 10*3/uL (ref 3.6–11.0)

## 2018-02-21 LAB — COMPREHENSIVE METABOLIC PANEL
ALT: 14 U/L (ref 14–54)
ANION GAP: 8 (ref 5–15)
AST: 15 U/L (ref 15–41)
Albumin: 3.1 g/dL — ABNORMAL LOW (ref 3.5–5.0)
Alkaline Phosphatase: 63 U/L (ref 38–126)
BUN: 10 mg/dL (ref 6–20)
CHLORIDE: 103 mmol/L (ref 101–111)
CO2: 25 mmol/L (ref 22–32)
CREATININE: 0.71 mg/dL (ref 0.44–1.00)
Calcium: 8.9 mg/dL (ref 8.9–10.3)
Glucose, Bld: 117 mg/dL — ABNORMAL HIGH (ref 65–99)
POTASSIUM: 3.2 mmol/L — AB (ref 3.5–5.1)
Sodium: 136 mmol/L (ref 135–145)
Total Bilirubin: 1.3 mg/dL — ABNORMAL HIGH (ref 0.3–1.2)
Total Protein: 6.6 g/dL (ref 6.5–8.1)

## 2018-02-21 LAB — URINALYSIS, ROUTINE W REFLEX MICROSCOPIC
Bilirubin Urine: NEGATIVE
GLUCOSE, UA: NEGATIVE mg/dL
KETONES UR: NEGATIVE mg/dL
NITRITE: POSITIVE — AB
PH: 6 (ref 5.0–8.0)
Protein, ur: 30 mg/dL — AB
SPECIFIC GRAVITY, URINE: 1.015 (ref 1.005–1.030)

## 2018-02-21 LAB — PROTIME-INR
INR: 1.27
Prothrombin Time: 15.8 seconds — ABNORMAL HIGH (ref 11.4–15.2)

## 2018-02-21 LAB — LACTIC ACID, PLASMA: LACTIC ACID, VENOUS: 1.3 mmol/L (ref 0.5–1.9)

## 2018-02-21 MED ORDER — ACETAMINOPHEN 325 MG PO TABS
650.0000 mg | ORAL_TABLET | Freq: Four times a day (QID) | ORAL | Status: DC | PRN
  Administered 2018-02-22 – 2018-02-24 (×4): 650 mg via ORAL
  Filled 2018-02-21 (×4): qty 2

## 2018-02-21 MED ORDER — SODIUM CHLORIDE 0.9 % IV BOLUS (SEPSIS): 1000.0000 mL | Freq: Once | INTRAVENOUS | Status: DC

## 2018-02-21 MED ORDER — SODIUM CHLORIDE 0.9 % IV SOLN
1.0000 g | Freq: Once | INTRAVENOUS | Status: AC
  Administered 2018-02-21: 1 g via INTRAVENOUS
  Filled 2018-02-21: qty 10

## 2018-02-21 MED ORDER — FLUTICASONE PROPIONATE (INHAL) 50 MCG/BLIST IN AEPB: 1.0000 | INHALATION_SPRAY | Freq: Two times a day (BID) | RESPIRATORY_TRACT | Status: DC

## 2018-02-21 MED ORDER — SODIUM CHLORIDE 0.9 % IV BOLUS
1000.0000 mL | Freq: Once | INTRAVENOUS | Status: AC
  Administered 2018-02-21: 1000 mL via INTRAVENOUS

## 2018-02-21 MED ORDER — SODIUM CHLORIDE 0.9 % IV SOLN
2.0000 g | INTRAVENOUS | Status: DC
  Administered 2018-02-22 – 2018-02-23 (×2): 2 g via INTRAVENOUS
  Filled 2018-02-21 (×3): qty 20

## 2018-02-21 MED ORDER — SPIRONOLACTONE 25 MG PO TABS
25.0000 mg | ORAL_TABLET | Freq: Every day | ORAL | Status: DC
  Administered 2018-02-22 – 2018-02-25 (×4): 25 mg via ORAL
  Filled 2018-02-21 (×4): qty 1

## 2018-02-21 MED ORDER — PIPERACILLIN-TAZOBACTAM 3.375 G IVPB 30 MIN: 3.3750 g | Freq: Once | INTRAVENOUS | Status: DC

## 2018-02-21 MED ORDER — PANTOPRAZOLE SODIUM 40 MG PO TBEC
40.0000 mg | DELAYED_RELEASE_TABLET | ORAL | Status: DC
  Administered 2018-02-22 – 2018-02-25 (×4): 40 mg via ORAL
  Filled 2018-02-21 (×5): qty 1

## 2018-02-21 MED ORDER — PRAVASTATIN SODIUM 20 MG PO TABS
10.0000 mg | ORAL_TABLET | Freq: Every day | ORAL | Status: DC
  Administered 2018-02-22 – 2018-02-24 (×3): 10 mg via ORAL
  Filled 2018-02-21 (×4): qty 1

## 2018-02-21 MED ORDER — APIXABAN 5 MG PO TABS
5.0000 mg | ORAL_TABLET | Freq: Two times a day (BID) | ORAL | Status: DC
  Filled 2018-02-21: qty 1

## 2018-02-21 MED ORDER — ONDANSETRON HCL 4 MG/2ML IJ SOLN
4.0000 mg | Freq: Four times a day (QID) | INTRAMUSCULAR | Status: DC | PRN
  Administered 2018-02-22 – 2018-02-23 (×2): 4 mg via INTRAVENOUS
  Filled 2018-02-21 (×2): qty 2

## 2018-02-21 MED ORDER — MONTELUKAST SODIUM 10 MG PO TABS
10.0000 mg | ORAL_TABLET | Freq: Every day | ORAL | Status: DC
  Filled 2018-02-21 (×2): qty 1

## 2018-02-21 MED ORDER — ONDANSETRON HCL 4 MG PO TABS: 4.0000 mg | ORAL_TABLET | Freq: Four times a day (QID) | ORAL | Status: DC | PRN

## 2018-02-21 MED ORDER — ACETAMINOPHEN 650 MG RE SUPP: 650.0000 mg | Freq: Four times a day (QID) | RECTAL | Status: DC | PRN

## 2018-02-21 MED ORDER — VANCOMYCIN HCL IN DEXTROSE 1-5 GM/200ML-% IV SOLN: 1000.0000 mg | Freq: Once | INTRAVENOUS | Status: DC

## 2018-02-21 NOTE — ED Notes (Signed)
Pt's belongings with her to the floor-clothing and medications

## 2018-02-21 NOTE — ED Notes (Signed)
Transport to floor room 114.AS 

## 2018-02-21 NOTE — ED Notes (Signed)
Patient ambulated to room commode with a steady gait. 

## 2018-02-21 NOTE — ED Notes (Signed)
Beth from pharmacy at bedside to reconcile medications; pt resting comfortably in bed, waiting patiently for admission; no complaints or requests

## 2018-02-21 NOTE — H&P (Signed)
[place] [facility] [place] NAME: Cassie Jones    MR#:  [id number]  DATE OF BIRTH:  Mar 11, 1946  DATE OF ADMISSION:  02/21/2018  PRIMARY CARE PHYSICIAN: McLean-Scocuzza, Nino Glow, MD   REQUESTING/REFERRING PHYSICIAN: Burlene Arnt, MD  CHIEF COMPLAINT:   Chief Complaint  Patient presents with  . Fever    HISTORY OF PRESENT ILLNESS:  Cassie Jones  is a 72 y.o. female who presents with fever for the last couple of days, chills today and dysuria.  Here in the ED she was found to have nitrite positive UA indicating UTI and she meets sepsis criteria.  Hospitalist called for admission  PAST MEDICAL HISTORY:   Past Medical History:  Diagnosis Date  . Ascites   . Asthma   . Bilateral leg edema   . Cancer ([place])   . Chicken pox   . Colon polyps   . Diverticulitis    with perforation 02/2017 and hosp with colostomy bag s/p removal   . GERD (gastroesophageal reflux disease)   . Hyperlipidemia   . Hypertension   . Multiple myeloma ([place])    dx'ed 01/2018 follows Dr. Tasia Catchings and [facility] H/O   . Pleural effusion   . Thyroid nodule   . UTI (urinary tract infection)   . Vitamin D deficiency      PAST SURGICAL HISTORY:   Past Surgical History:  Procedure Laterality Date  . ABDOMINAL HYSTERECTOMY    . BREAST BIOPSY Bilateral yrs ago   benign  . CHOLECYSTECTOMY    . COLOSTOMY  2018  . COLOSTOMY REVERSAL     11 2018  . KYPHOPLASTY     11/2017 [place]      SOCIAL HISTORY:   Social History   Tobacco Use  . Smoking status: Former Research scientist (life sciences)  . Smokeless tobacco: Never Used  Substance Use Topics  . Alcohol use: Not Currently     FAMILY HISTORY:   Family History  Problem Relation Age of Onset  . Breast cancer Mother 63  . Cancer Mother        breast   . Diabetes Mother   . Heart disease Mother   . Hyperlipidemia Mother   . Hypertension Mother   . Birth defects Brother   . Diabetes Brother   . Heart disease Brother   . Hyperlipidemia  Brother   . Cancer Father        prostate met to liver      DRUG ALLERGIES:   Allergies  Allergen Reactions  . Codeine Nausea And Vomiting    MEDICATIONS AT HOME:   Prior to Admission medications   Medication Sig Start Date End Date Taking? Authorizing Provider  albuterol (VENTOLIN HFA) 108 (90 Base) MCG/ACT inhaler Inhale 2 puffs into the lungs every 6 (six) hours as needed. 01/15/18   Verlon Au, NP  apixaban (ELIQUIS) 5 MG TABS tablet Take 2 tablets ('10mg'$ ) twice a day for 7 days. Take 1 tablet ('5mg'$ ) twice daily after first week. 02/16/18   Earlie Server, MD  erythromycin [facility]) ophthalmic ointment Place 1 application into the left eye 3 (three) times daily. Left eye only x 1 week 01/28/18   McLean-Scocuzza, Nino Glow, MD  fluticasone (FLOVENT DISKUS) 50 MCG/BLIST diskus inhaler Inhale 1 puff into the lungs 2 (two) times daily. 01/22/18   Earlie Server, MD  furosemide (LASIX) 20 MG tablet Take 1 tablet (20 mg total) by mouth daily as needed. 01/28/18   McLean-Scocuzza, Nino Glow, MD  magic mouthwash w/lidocaine SOLN Take 5 mLs by mouth 3 (three) times daily. Patient not taking: Reported on 02/05/2018 01/15/18   Verlon Au, NP  montelukast (SINGULAIR) 10 MG tablet Take 10 mg by mouth at bedtime.    [provider]  ondansetron (ZOFRAN) 8 MG tablet Take 1 tablet by mouth every 8 (eight) hours as needed. 01/23/17   [provider]  pantoprazole (PROTONIX) 40 MG tablet Take 1 tablet by mouth every morning. 01/22/16   [provider]  POMALYST 4 MG capsule Take 1 capsule by mouth with water every day on days 1-21 every 28 days. 01/28/18   Earlie Server, MD  pravastatin (PRAVACHOL) 10 MG tablet Take 1 tablet by mouth at bedtime. 12/11/16   [provider]  spironolactone (ALDACTONE) 25 MG tablet Take 1 tablet (25 mg total) by mouth daily. 01/28/18   McLean-Scocuzza, Nino Glow, MD    REVIEW OF SYSTEMS:  Review of Systems  Constitutional: Positive for chills and fever.  Negative for malaise/fatigue and weight loss.  HENT: Negative for ear pain, hearing loss and tinnitus.   Eyes: Negative for blurred vision, double vision, pain and redness.  Respiratory: Negative for cough, hemoptysis and shortness of breath.   Cardiovascular: Negative for chest pain, palpitations, orthopnea and leg swelling.  Gastrointestinal: Negative for abdominal pain, constipation, diarrhea, nausea and vomiting.  Genitourinary: Positive for dysuria. Negative for frequency and hematuria.  Musculoskeletal: Negative for back pain, joint pain and neck pain.  Skin:       No acne, rash, or lesions  Neurological: Negative for dizziness, tremors, focal weakness and weakness.  Endo/Heme/Allergies: Negative for polydipsia. Does not bruise/bleed easily.  Psychiatric/Behavioral: Negative for depression. The patient is not nervous/anxious and does not have insomnia.      VITAL SIGNS:   Vitals:   02/21/18 2030 02/21/18 2100 02/21/18 2130 02/21/18 2138  BP: (!) 109/39 (!) 112/49 (!) 119/46   Pulse: 72 65 64   Resp: (!) 26 (!) 24 (!) 22   Temp:    (!) 97.5 F (36.4 C)  TempSrc:      SpO2: 98% 97% 96%   Weight:      Height:       Wt Readings from Last 3 Encounters:  02/21/18 59.9 kg (132 lb)  02/16/18 60.2 kg (132 lb 9.7 oz)  02/16/18 60.4 kg (133 lb 1.6 oz)    PHYSICAL EXAMINATION:  Physical Exam  Vitals reviewed. Constitutional: She is oriented to person, place, and time. She appears well-developed and well-nourished. No distress.  HENT:  Head: Normocephalic and atraumatic.  Mouth/Throat: Oropharynx is clear and moist.  Eyes: Pupils are equal, round, and reactive to light. Conjunctivae and EOM are normal. No scleral icterus.  Neck: Normal range of motion. Neck supple. No JVD present. No thyromegaly present.  Cardiovascular: Normal rate, regular rhythm and intact distal pulses. Exam reveals no gallop and no friction rub.  No murmur heard. Respiratory: Effort normal and breath  sounds normal. No respiratory distress. She has no wheezes. She has no rales.  GI: Soft. Bowel sounds are normal. She exhibits no distension. There is tenderness (Suprapubic).  Musculoskeletal: Normal range of motion. She exhibits no edema.  No arthritis, no gout  Lymphadenopathy:    She has no cervical adenopathy.  Neurological: She is alert and oriented to person, place, and time. No cranial nerve deficit.  No dysarthria, no aphasia  Skin: Skin is warm and dry. No rash noted. No erythema.  Psychiatric: She has  a normal mood and affect. Her behavior is normal. Judgment and thought content normal.    LABORATORY PANEL:   CBC Recent Labs  Lab 02/21/18 2006  WBC 4.3  HGB 12.6  HCT 37.6  PLT 196   ------------------------------------------------------------------------------------------------------------------  Chemistries  Recent Labs  Lab 02/21/18 2006  NA 136  K 3.2*  CL 103  CO2 25  GLUCOSE 117*  BUN 10  CREATININE 0.71  CALCIUM 8.9  AST 15  ALT 14  ALKPHOS 63  BILITOT 1.3*   ------------------------------------------------------------------------------------------------------------------  Cardiac Enzymes No results for input(s): TROPONINI in the last 168 hours. ------------------------------------------------------------------------------------------------------------------  RADIOLOGY:  Dg Chest Port 1 View  Result Date: 02/21/2018 CLINICAL DATA:  Pt arrives via ems from home, pt is a receiving chemo for her multiple myeloma, pt started running a fever 2 days ago. Pt denies cough or congestion, states a little burning today when she urinates, pt was recently diagnosed with.*comment was truncated* EXAM: PORTABLE CHEST 1 VIEW COMPARISON:  None. FINDINGS: Normal mediastinum and cardiac silhouette. Normal pulmonary vasculature. No evidence of effusion, infiltrate, or pneumothorax. No acute bony abnormality. IMPRESSION: No acute cardiopulmonary process. Electronically  Signed   By: Suzy Bouchard M.D.   On: 02/21/2018 21:06    EKG:   Orders placed or performed during the hospital encounter of 02/21/18  . ED EKG 12-Lead  . ED EKG 12-Lead    IMPRESSION AND PLAN:  Principal Problem:   Sepsis ([place]) -blood pressure stable, lactic acid within normal limits, sepsis is due to UTI, IV antibiotics started, culture sent Active Problems:   UTI (urinary tract infection) -IV antibiotics and culture as above   Essential hypertension -continue home meds   Multiple myeloma in relapse [facility]) -patient receives infusions for this, was due for one on Tuesday though this will likely be postponed given her infection.   Gastroesophageal reflux disease without esophagitis -on dose PPI   HLD (hyperlipidemia) -Home dose antilipid  Chart review performed and case discussed with ED provider. Labs, imaging and/or ECG reviewed by provider and discussed with patient/family. Management plans discussed with the patient and/or family.  DVT PROPHYLAXIS: Systemic anticoagulation  GI PROPHYLAXIS: PPI  ADMISSION STATUS: Inpatient  CODE STATUS: Full Advance Directive Documentation     Most Recent Value  Type of Advance Directive  Healthcare Power of Attorney  Pre-existing out of facility DNR order (yellow form or pink MOST form)  -  "MOST" Form in Place?  -      TOTAL TIME TAKING CARE OF THIS PATIENT: 45 minutes.   Halei Hanover [place] 02/21/2018, 10:39 PM  [facility]  Office  [phone]  CC: Primary care physician; McLean-Scocuzza, Nino Glow, MD  Note:  This document was prepared using Dragon voice recognition software and may include unintentional dictation errors.

## 2018-02-21 NOTE — ED Provider Notes (Signed)
[facility] Emergency Department Provider Note  ____________________________________________   I have reviewed the triage vital signs and the nursing notes. Where available I have reviewed prior notes and, if possible and indicated, outside hospital notes.    HISTORY  Chief Complaint Fever    HPI Cassie Jones is a 72 y.o. female who is getting immunotherapy for multiple myeloma she states that she is been having fever and feeling weak over the last 2 to 3 days.  She states T-max was over 101.  She tried to call her oncologist but was unable to get a call back.  Patient states that she has had dysuria but no other focal symptoms, no cough no shortness of breath no nausea no vomiting no diarrhea no abdominal pain no headache no stiff neck.  She does feel generally depleted by this.  She recently was treated for bronchitis.  She has been taking Tylenol Motrin which alleviates the fever but still, leaves her feeling weak.  She denies any other antecedent interventions, nothing makes it better nothing makes worse    Past Medical History:  Diagnosis Date  . Ascites   . Asthma   . Bilateral leg edema   . Cancer ([place])   . Chicken pox   . Colon polyps   . Diverticulitis    with perforation 02/2017 and hosp with colostomy bag s/p removal   . GERD (gastroesophageal reflux disease)   . Hyperlipidemia   . Hypertension   . Multiple myeloma ([place])    dx'ed 01/2018 follows Dr. Tasia Catchings and [facility] H/O   . Pleural effusion   . Thyroid nodule   . UTI (urinary tract infection)   . Vitamin D deficiency     Patient Active Problem List   Diagnosis Date Noted  . Chronic low back pain 01/29/2018  . Neuropathy 01/28/2018  . Vitamin D deficiency 01/28/2018  . HLD (hyperlipidemia) 01/04/2018  . Tobacco abuse 01/04/2018  . Goals of care, counseling/discussion 01/04/2018  . Multiple myeloma in relapse ([place]) 01/04/2018  . Bone metastasis ([place]) 12/22/2017  . Status post surgery  09/07/2017  . Bilateral lower extremity edema 03/18/2017  . Status post Jeanette Caprice procedure [facility]) 03/18/2017  . Hypokalemia 03/01/2017  . Lower GI bleed 03/01/2017  . Multiple myeloma not having achieved remission ([place]) 03/01/2017  . Gastroesophageal reflux disease without esophagitis 10/01/2015  . Other seasonal allergic rhinitis 01/25/2015  . Deficiency of other specified B group vitamins 10/26/2014  . Anemia, unspecified 07/21/2014  . Essential hypertension 07/21/2014  . Mid back pain 07/21/2014  . Osteopenia 06/22/2014    Past Surgical History:  Procedure Laterality Date  . ABDOMINAL HYSTERECTOMY    . BREAST BIOPSY Bilateral yrs ago   benign  . CHOLECYSTECTOMY    . COLOSTOMY  2018  . COLOSTOMY REVERSAL     11 2018  . KYPHOPLASTY     11/2017 [place]     Prior to Admission medications   Medication Sig Start Date End Date Taking? Authorizing Provider  albuterol (VENTOLIN HFA) 108 (90 Base) MCG/ACT inhaler Inhale 2 puffs into the lungs every 6 (six) hours as needed. 01/15/18   Verlon Au, NP  apixaban (ELIQUIS) 5 MG TABS tablet Take 2 tablets ('10mg'$ ) twice a day for 7 days. Take 1 tablet ('5mg'$ ) twice daily after first week. 02/16/18   Earlie Server, MD  erythromycin [facility]) ophthalmic ointment Place 1 application into the left eye 3 (three) times daily. Left eye only x 1 week 01/28/18  McLean-Scocuzza, Nino Glow, MD  fluticasone (FLOVENT DISKUS) 50 MCG/BLIST diskus inhaler Inhale 1 puff into the lungs 2 (two) times daily. 01/22/18   Earlie Server, MD  furosemide (LASIX) 20 MG tablet Take 1 tablet (20 mg total) by mouth daily as needed. 01/28/18   McLean-Scocuzza, Nino Glow, MD  magic mouthwash w/lidocaine SOLN Take 5 mLs by mouth 3 (three) times daily. Patient not taking: Reported on 02/05/2018 01/15/18   Verlon Au, NP  montelukast (SINGULAIR) 10 MG tablet Take 10 mg by mouth at bedtime.    [provider]  ondansetron (ZOFRAN) 8 MG tablet Take 1 tablet by mouth every 8  (eight) hours as needed. 01/23/17   [provider]  pantoprazole (PROTONIX) 40 MG tablet Take 1 tablet by mouth every morning. 01/22/16   [provider]  POMALYST 4 MG capsule Take 1 capsule by mouth with water every day on days 1-21 every 28 days. 01/28/18   Earlie Server, MD  pravastatin (PRAVACHOL) 10 MG tablet Take 1 tablet by mouth at bedtime. 12/11/16   [provider]  spironolactone (ALDACTONE) 25 MG tablet Take 1 tablet (25 mg total) by mouth daily. 01/28/18   McLean-Scocuzza, Nino Glow, MD    Allergies Codeine  Family History  Problem Relation Age of Onset  . Breast cancer Mother 74  . Cancer Mother        breast   . Diabetes Mother   . Heart disease Mother   . Hyperlipidemia Mother   . Hypertension Mother   . Birth defects Brother   . Diabetes Brother   . Heart disease Brother   . Hyperlipidemia Brother   . Cancer Father        prostate met to liver     Social History Social History   Tobacco Use  . Smoking status: Former Research scientist (life sciences)  . Smokeless tobacco: Never Used  Substance Use Topics  . Alcohol use: Not Currently  . Drug use: Never    Review of Systems Constitutional: + fever/chills Eyes: No visual changes. ENT: No sore throat. No stiff neck no neck pain Cardiovascular: Denies chest pain. Respiratory: Denies shortness of breath. Gastrointestinal:   no vomiting.  No diarrhea.  No constipation. Genitourinary: + for dysuria. Musculoskeletal: Negative lower extremity swelling Skin: Negative for rash. Neurological: Negative for severe headaches, focal weakness or numbness.   ____________________________________________   PHYSICAL EXAM:  VITAL SIGNS: ED Triage Vitals  Enc Vitals Group     BP 02/21/18 2012 (!) 120/42     Pulse Rate 02/21/18 1956 76     Resp 02/21/18 1956 16     Temp 02/21/18 1956 98.1 F (36.7 C)     Temp Source 02/21/18 1956 Oral     SpO2 02/21/18 2012 98 %     Weight 02/21/18 1957 132 lb (59.9 kg)     Height  02/21/18 1957 '5\' 2"'$  (1.575 m)     Head Circumference --      Peak Flow --      Pain Score 02/21/18 1957 0     Pain Loc --      Pain Edu? --      Excl. in [place]? --     Constitutional: Alert and oriented. Well appearing and in no acute distress. Eyes: Conjunctivae are normal Head: Atraumatic HEENT: No congestion/rhinnorhea. Mucous membranes are moist.  Oropharynx non-erythematous Neck:   Nontender with no meningismus, no masses, no stridor Cardiovascular: Normal rate, regular rhythm. Grossly normal heart sounds.  Good  peripheral circulation. Respiratory: Normal respiratory effort.  No retractions. Lungs CTAB. Abdominal: Soft and nontender. No distention. No guarding no rebound Back:  There is no focal tenderness or step off.  there is no midline tenderness there are no lesions noted. there is no CVA tenderness  Musculoskeletal: No lower extremity tenderness, no upper extremity tenderness. No joint effusions, no DVT signs strong distal pulses no edema Neurologic:  Normal speech and language. No gross focal neurologic deficits are appreciated.  Skin:  Skin is warm, dry and intact. No rash noted. Psychiatric: Mood and affect are normal. Speech and behavior are normal.  ____________________________________________   LABS (all labs ordered are listed, but only abnormal results are displayed)  Labs Reviewed  COMPREHENSIVE METABOLIC PANEL - Abnormal; Notable for the following components:      Result Value   Potassium 3.2 (*)    Glucose, Bld 117 (*)    Albumin 3.1 (*)    Total Bilirubin 1.3 (*)    All other components within normal limits  CBC WITH DIFFERENTIAL/PLATELET - Abnormal; Notable for the following components:   RDW 15.8 (*)    Lymphs Abs 0.3 (*)    All other components within normal limits  URINALYSIS, ROUTINE W REFLEX MICROSCOPIC - Abnormal; Notable for the following components:   Color, Urine YELLOW (*)    APPearance CLOUDY (*)    Hgb urine dipstick SMALL (*)    Protein,  ur 30 (*)    Nitrite POSITIVE (*)    Leukocytes, UA MODERATE (*)    Bacteria, UA MANY (*)    All other components within normal limits  PROTIME-INR - Abnormal; Notable for the following components:   Prothrombin Time 15.8 (*)    All other components within normal limits  CULTURE, BLOOD (ROUTINE X 2)  CULTURE, BLOOD (ROUTINE X 2)  URINE CULTURE  LACTIC ACID, PLASMA  LACTIC ACID, PLASMA    Pertinent labs  results that were available during my care of the patient were reviewed by me and considered in my medical decision making (see chart for details). ____________________________________________  EKG  I personally interpreted any EKGs ordered by me or triage  ____________________________________________  RADIOLOGY  Pertinent labs & imaging results that were available during my care of the patient were reviewed by me and considered in my medical decision making (see chart for details). If possible, patient and/or family made aware of any abnormal findings.  Dg Chest Port 1 View  Result Date: 02/21/2018 CLINICAL DATA:  Pt arrives via ems from home, pt is a receiving chemo for her multiple myeloma, pt started running a fever 2 days ago. Pt denies cough or congestion, states a little burning today when she urinates, pt was recently diagnosed with.*comment was truncated* EXAM: PORTABLE CHEST 1 VIEW COMPARISON:  None. FINDINGS: Normal mediastinum and cardiac silhouette. Normal pulmonary vasculature. No evidence of effusion, infiltrate, or pneumothorax. No acute bony abnormality. IMPRESSION: No acute cardiopulmonary process. Electronically Signed   By: Suzy Bouchard M.D.   On: 02/21/2018 21:06   ____________________________________________    PROCEDURES  Procedure(s) performed: None  Procedures  Critical Care performed: None  ____________________________________________   INITIAL IMPRESSION / ASSESSMENT AND PLAN / ED COURSE  Pertinent labs & imaging results that were available  during my care of the patient were reviewed by me and considered in my medical decision making (see chart for details).  Patient here with some degree of immunocompromise, and fevers at home, did take antipyretics immediately before coming and is not  febrile here.  Lactic and work-up is negative for sepsis thus far although we did initiate sepsis protocol out of concern for her initial complaint and presentation, however, it does appear that she has a urinary tract infection.  Urine and blood cultures have been sent we will treat with Rocephin.  There is no prior urine culture here to guide our care.  We will admit her to the hospital given her presentation and comorbidities    ____________________________________________   FINAL CLINICAL IMPRESSION(S) / ED DIAGNOSES  Final diagnoses:  Acute cystitis without hematuria      This chart was dictated using voice recognition software.  Despite best efforts to proofread,  errors can occur which can change meaning.      Schuyler Amor, MD 02/21/18 2149

## 2018-02-21 NOTE — Progress Notes (Signed)
Pharmacy Antibiotic Note  Cassie Jones is a 72 y.o. female admitted on 02/21/2018 with UTI.  Pharmacy has been consulted for ceftriaxone dosing.  Plan: Ceftriaxone 2 grams q 24 hours ordered.  Height: 5\' 2"  (157.5 cm) Weight: 132 lb (59.9 kg) IBW/kg (Calculated) : 50.1  Temp (24hrs), Avg:97.8 F (36.6 C), Min:97.5 F (36.4 C), Max:98.1 F (36.7 C)  Recent Labs  Lab 02/16/18 0810 02/21/18 2006  WBC 6.3 4.3  CREATININE 0.71 0.71  LATICACIDVEN  --  1.3    Estimated Creatinine Clearance: 51.8 mL/min (by C-G formula based on SCr of 0.71 mg/dL).    Allergies  Allergen Reactions  . Codeine Nausea And Vomiting    Antimicrobials this admission: Ceftriaxone 5/5  >>    >>   Dose adjustments this admission:   Microbiology results: 5/5 BCx: pending 5/5 UCx: pending       5/5 UA: LE(+) NO2(+)  WBC 21-50  Thank you for allowing pharmacy to be a part of this patient's care.  Jessicia Napolitano S 02/21/2018 11:51 PM

## 2018-02-21 NOTE — ED Triage Notes (Signed)
Pt arrives via ems from home, pt is a receiving chemo for her multiple myeloma, pt started running a fever 2 days ago. Pt denies cough or congestion, states a little burning today when she urinates, pt was recently diagnosed with a blood clot to the rt arm

## 2018-02-22 ENCOUNTER — Other Ambulatory Visit: Payer: Self-pay

## 2018-02-22 LAB — CBC
HEMATOCRIT: 35.3 % (ref 35.0–47.0)
Hemoglobin: 12.3 g/dL (ref 12.0–16.0)
MCH: 30.9 pg (ref 26.0–34.0)
MCHC: 34.8 g/dL (ref 32.0–36.0)
MCV: 88.8 fL (ref 80.0–100.0)
PLATELETS: 156 10*3/uL (ref 150–440)
RBC: 3.97 MIL/uL (ref 3.80–5.20)
RDW: 15.4 % — AB (ref 11.5–14.5)
WBC: 3.7 10*3/uL (ref 3.6–11.0)

## 2018-02-22 LAB — BASIC METABOLIC PANEL
Anion gap: 9 (ref 5–15)
BUN: 7 mg/dL (ref 6–20)
CALCIUM: 7.9 mg/dL — AB (ref 8.9–10.3)
CO2: 24 mmol/L (ref 22–32)
CREATININE: 0.6 mg/dL (ref 0.44–1.00)
Chloride: 103 mmol/L (ref 101–111)
GFR calc Af Amer: >60 mL/min (ref >60)
Glucose, Bld: 118 mg/dL — ABNORMAL HIGH (ref 65–99)
POTASSIUM: 3 mmol/L — AB (ref 3.5–5.1)
SODIUM: 136 mmol/L (ref 135–145)

## 2018-02-22 LAB — MAGNESIUM: MAGNESIUM: 1.4 mg/dL — AB (ref 1.7–2.4)

## 2018-02-22 MED ORDER — APIXABAN 5 MG PO TABS
10.0000 mg | ORAL_TABLET | Freq: Two times a day (BID) | ORAL | Status: DC
  Administered 2018-02-22: 10 mg via ORAL

## 2018-02-22 MED ORDER — OXYCODONE HCL ER 10 MG PO T12A
10.0000 mg | EXTENDED_RELEASE_TABLET | Freq: Two times a day (BID) | ORAL | Status: DC
  Administered 2018-02-22 – 2018-02-25 (×7): 10 mg via ORAL
  Filled 2018-02-22 (×7): qty 1

## 2018-02-22 MED ORDER — POMALIDOMIDE 4 MG PO CAPS
4.0000 mg | ORAL_CAPSULE | Freq: Every day | ORAL | Status: DC
  Administered 2018-02-22 – 2018-02-24 (×3): 4 mg via ORAL
  Filled 2018-02-22 (×4): qty 1

## 2018-02-22 MED ORDER — MAGNESIUM SULFATE 2 GM/50ML IV SOLN
2.0000 g | Freq: Once | INTRAVENOUS | Status: AC
  Administered 2018-02-22: 13:00:00 2 g via INTRAVENOUS
  Filled 2018-02-22: qty 50

## 2018-02-22 MED ORDER — LOPERAMIDE HCL 2 MG PO CAPS
2.0000 mg | ORAL_CAPSULE | Freq: Four times a day (QID) | ORAL | Status: DC | PRN
  Filled 2018-02-22: qty 1

## 2018-02-22 MED ORDER — OXYCODONE HCL 5 MG PO TABS: 5.0000 mg | ORAL_TABLET | Freq: Three times a day (TID) | ORAL | Status: DC | PRN

## 2018-02-22 MED ORDER — BUDESONIDE 0.25 MG/2ML IN SUSP
0.2500 mg | Freq: Two times a day (BID) | RESPIRATORY_TRACT | Status: DC
  Administered 2018-02-22 – 2018-02-25 (×6): 0.25 mg via RESPIRATORY_TRACT
  Filled 2018-02-22 (×7): qty 2

## 2018-02-22 MED ORDER — POTASSIUM CHLORIDE CRYS ER 20 MEQ PO TBCR
40.0000 meq | EXTENDED_RELEASE_TABLET | Freq: Two times a day (BID) | ORAL | Status: AC
  Administered 2018-02-22 (×2): 40 meq via ORAL
  Filled 2018-02-22 (×2): qty 2

## 2018-02-22 MED ORDER — APIXABAN 5 MG PO TABS
10.0000 mg | ORAL_TABLET | Freq: Two times a day (BID) | ORAL | Status: AC
  Administered 2018-02-22 – 2018-02-23 (×4): 10 mg via ORAL
  Filled 2018-02-22 (×4): qty 2

## 2018-02-22 MED ORDER — APIXABAN 5 MG PO TABS
5.0000 mg | ORAL_TABLET | Freq: Two times a day (BID) | ORAL | Status: DC
  Administered 2018-02-24 – 2018-02-25 (×3): 5 mg via ORAL
  Filled 2018-02-22 (×3): qty 1

## 2018-02-22 MED ORDER — POMALIDOMIDE 4 MG PO CAPS
4.0000 mg | ORAL_CAPSULE | ORAL | Status: DC
  Administered 2018-02-22: 4 mg via ORAL

## 2018-02-22 NOTE — Progress Notes (Signed)
[place] at [place] NAME: Cassie Jones    MR#:  [id number]  DATE OF BIRTH:  [birth year]  SUBJECTIVE:  CHIEF COMPLAINT:   Chief Complaint  Patient presents with  . Fever   Still has fever and chills, generalized weakness. REVIEW OF SYSTEMS:  Review of Systems  Constitutional: Positive for chills, fever and malaise/fatigue.  HENT: Negative for sore throat.   Eyes: Negative for blurred vision and double vision.  Respiratory: Negative for cough, hemoptysis, shortness of breath, wheezing and stridor.   Cardiovascular: Negative for chest pain, palpitations, orthopnea and leg swelling.  Gastrointestinal: Negative for abdominal pain, blood in stool, diarrhea, melena, nausea and vomiting.  Genitourinary: Positive for dysuria and urgency. Negative for flank pain and hematuria.  Musculoskeletal: Negative for back pain and joint pain.  Skin: Negative for rash.  Neurological: Negative for dizziness, sensory change, focal weakness, seizures, loss of consciousness, weakness and headaches.  Endo/Heme/Allergies: Negative for polydipsia.  Psychiatric/Behavioral: Negative for depression. The patient is not nervous/anxious.     DRUG ALLERGIES:   Allergies  Allergen Reactions  . Codeine Nausea And Vomiting   VITALS:  Blood pressure (!) 126/56, pulse 78, temperature 99.8 F (37.7 C), temperature source Oral, resp. rate 18, height 5' 2" (1.575 m), weight 132 lb 12.8 oz (60.2 kg), SpO2 98 %. PHYSICAL EXAMINATION:  Physical Exam  Constitutional: She is oriented to person, place, and time.  HENT:  Head: Normocephalic.  Mouth/Throat: Oropharynx is clear and moist.  Eyes: Pupils are equal, round, and reactive to light. Conjunctivae and EOM are normal. No scleral icterus.  Neck: Normal range of motion. Neck supple. No JVD present. No tracheal deviation present.  Cardiovascular: Normal rate, regular rhythm and normal heart sounds. Exam reveals no gallop.   No murmur heard. Pulmonary/Chest: Effort normal and breath sounds normal. No respiratory distress. She has no wheezes. She has no rales.  Abdominal: Soft. Bowel sounds are normal. She exhibits no distension. There is no tenderness. There is no rebound.  Musculoskeletal: Normal range of motion. She exhibits no edema or tenderness.  Neurological: She is alert and oriented to person, place, and time. No cranial nerve deficit.  Skin: No rash noted. No erythema.  Psychiatric: She has a normal mood and affect.   LABORATORY PANEL:  Female CBC Recent Labs  Lab 02/22/18 0425  WBC 3.7  HGB 12.3  HCT 35.3  PLT 156   ------------------------------------------------------------------------------------------------------------------ Chemistries  Recent Labs  Lab 02/21/18 2006 02/22/18 0425  NA 136 136  K 3.2* 3.0*  CL 103 103  CO2 25 24  GLUCOSE 117* 118*  BUN 10 7  CREATININE 0.71 0.60  CALCIUM 8.9 7.9*  MG  --  1.4*  AST 15  --   ALT 14  --   ALKPHOS 63  --   BILITOT 1.3*  --    RADIOLOGY:  Dg Chest Port 1 View  Result Date: 02/21/2018 CLINICAL DATA:  Pt arrives via ems from home, pt is a receiving chemo for her multiple myeloma, pt started running a fever 2 days ago. Pt denies cough or congestion, states a little burning today when she urinates, pt was recently diagnosed with.*comment was truncated* EXAM: PORTABLE CHEST 1 VIEW COMPARISON:  None. FINDINGS: Normal mediastinum and cardiac silhouette. Normal pulmonary vasculature. No evidence of effusion, infiltrate, or pneumothorax. No acute bony abnormality. IMPRESSION: No acute cardiopulmonary process. Electronically Signed   By: Suzy Bouchard M.D.   On: 02/21/2018 21:06  ASSESSMENT AND PLAN:   Sepsis due to UTI Continue Rocephin, follow-up urine culture and blood culture.  Hypokalemia.  Give potassium supplement and follow-up level. Hypomagnesemia.  Give IV magnesium and follow-up level.   Essential hypertension -continue  home meds    Multiple myeloma in relapse [facility]) -patient receives infusions for this, was due for one on Tuesday though this will likely be postponed given her infection.    Gastroesophageal reflux disease without esophagitis -on dose PPI   HLD (hyperlipidemia) -Home dose antilipid  Generalized weakness.  Follow-up PT evaluation. All the records are reviewed and case discussed with Care Management/Social Worker. Management plans discussed with the patient, her husband and they are in agreement.  CODE STATUS: DNR  TOTAL TIME TAKING CARE OF THIS PATIENT: 32 minutes.   More than 50% of the time was spent in counseling/coordination of care: YES  POSSIBLE D/C IN 2 DAYS, DEPENDING ON CLINICAL CONDITION.   Demetrios Loll M.D on 02/22/2018 at 4:45 PM  Between 7am to 6pm - Pager - [phone]  After 6pm go to [email or web address] - [employer] [facility]

## 2018-02-22 NOTE — Progress Notes (Signed)
Advanced Care Plan.  Purpose of Encounter: CODE STATUS. Parties in Attendance: The patient, her husband and me. Patient's Decisional Capacity: Yes. Medical Story: Cassie Jones  is a 72 y.o. female  with multiple medical problems including ascites, asthma, cancer, diverticulitis, hypertension, hyperlipidemia and multiple myeloma.  The patient is admitted for sepsis due to UTI and on antibiotics IV.  She still has fever, chills, dysuria and generalized weakness.  I discussed with the patient and her husband about her current condition, prognosis and CODE STATUS.  She stated that she wants DNR. I changed from full code to DNR after the conversation. Plan:  Code Status: DNR. Time spent discussing advance care planning: 17 minutes.

## 2018-02-22 NOTE — Progress Notes (Signed)
Unable to chart in [facility].  Pt given Eliquis 10 mg. Dorna Bloom RN

## 2018-02-23 ENCOUNTER — Inpatient Hospital Stay: Payer: Medicare PPO

## 2018-02-23 ENCOUNTER — Inpatient Hospital Stay: Payer: Medicare PPO | Admitting: Oncology

## 2018-02-23 LAB — BASIC METABOLIC PANEL
ANION GAP: 6 (ref 5–15)
BUN: 7 mg/dL (ref 6–20)
CALCIUM: 7.9 mg/dL — AB (ref 8.9–10.3)
CHLORIDE: 102 mmol/L (ref 101–111)
CO2: 24 mmol/L (ref 22–32)
Creatinine, Ser: 0.62 mg/dL (ref 0.44–1.00)
GFR calc non Af Amer: >60 mL/min (ref >60)
GLUCOSE: 110 mg/dL — AB (ref 65–99)
Potassium: 4 mmol/L (ref 3.5–5.1)
Sodium: 132 mmol/L — ABNORMAL LOW (ref 135–145)

## 2018-02-23 LAB — MAGNESIUM: Magnesium: 1.8 mg/dL (ref 1.7–2.4)

## 2018-02-23 MED ORDER — DOCUSATE SODIUM 100 MG PO CAPS
100.0000 mg | ORAL_CAPSULE | Freq: Two times a day (BID) | ORAL | Status: DC
  Filled 2018-02-23: qty 1

## 2018-02-23 MED ORDER — BISACODYL 5 MG PO TBEC
5.0000 mg | DELAYED_RELEASE_TABLET | Freq: Every day | ORAL | Status: DC | PRN
  Administered 2018-02-23: 10:00:00 5 mg via ORAL
  Filled 2018-02-23: qty 1

## 2018-02-23 NOTE — Progress Notes (Signed)
[place] at [place] NAME: Cassie Jones    MR#:  [id number]  DATE OF BIRTH:  [birth year]  SUBJECTIVE:  CHIEF COMPLAINT:   Chief Complaint  Patient presents with  . Fever   No  fever and chills, but has sweating and generalized weakness. REVIEW OF SYSTEMS:  Review of Systems  Constitutional: Positive for malaise/fatigue. Negative for chills and fever.  HENT: Negative for sore throat.   Eyes: Negative for blurred vision and double vision.  Respiratory: Negative for cough, hemoptysis, shortness of breath, wheezing and stridor.   Cardiovascular: Negative for chest pain, palpitations, orthopnea and leg swelling.  Gastrointestinal: Negative for abdominal pain, blood in stool, diarrhea, melena, nausea and vomiting.  Genitourinary: Negative for dysuria, flank pain, hematuria and urgency.  Musculoskeletal: Negative for back pain and joint pain.  Skin: Negative for rash.  Neurological: Negative for dizziness, sensory change, focal weakness, seizures, loss of consciousness, weakness and headaches.  Endo/Heme/Allergies: Negative for polydipsia.  Psychiatric/Behavioral: Negative for depression. The patient is not nervous/anxious.     DRUG ALLERGIES:   Allergies  Allergen Reactions  . Codeine Nausea And Vomiting   VITALS:  Blood pressure 125/67, pulse 73, temperature 99.1 F (37.3 C), temperature source Oral, resp. rate 20, height '5\' 2"'$  (1.575 m), weight 132 lb 12.8 oz (60.2 kg), SpO2 90 %. PHYSICAL EXAMINATION:  Physical Exam  Constitutional: She is oriented to person, place, and time.  HENT:  Head: Normocephalic.  Mouth/Throat: Oropharynx is clear and moist.  Eyes: Pupils are equal, round, and reactive to light. Conjunctivae and EOM are normal. No scleral icterus.  Neck: Normal range of motion. Neck supple. No JVD present. No tracheal deviation present.  Cardiovascular: Normal rate, regular rhythm and normal heart sounds. Exam reveals no  gallop.  No murmur heard. Pulmonary/Chest: Effort normal and breath sounds normal. No respiratory distress. She has no wheezes. She has no rales.  Abdominal: Soft. Bowel sounds are normal. She exhibits no distension. There is no tenderness. There is no rebound.  Musculoskeletal: Normal range of motion. She exhibits no edema or tenderness.  Neurological: She is alert and oriented to person, place, and time. No cranial nerve deficit.  Skin: No rash noted. No erythema.  Psychiatric: She has a normal mood and affect.   LABORATORY PANEL:  Female CBC Recent Labs  Lab 02/22/18 0425  WBC 3.7  HGB 12.3  HCT 35.3  PLT 156   ------------------------------------------------------------------------------------------------------------------ Chemistries  Recent Labs  Lab 02/21/18 2006  02/23/18 0454  NA 136   < > 132*  K 3.2*   < > 4.0  CL 103   < > 102  CO2 25   < > 24  GLUCOSE 117*   < > 110*  BUN 10   < > 7  CREATININE 0.71   < > 0.62  CALCIUM 8.9   < > 7.9*  MG  --    < > 1.8  AST 15  --   --   ALT 14  --   --   ALKPHOS 63  --   --   BILITOT 1.3*  --   --    < > = values in this interval not displayed.   RADIOLOGY:  No results found. ASSESSMENT AND PLAN:   Sepsis due to UTI Continue Rocephin, follow-up urine culture: >=100,000 COLONIES/mL KLEBSIELLA PNEUMONIAE  and blood culture are negative so far.  Follow-up urine culture sensitivity.  Hypokalemia.  Improved with potassium supplement.  Hypomagnesemia.  Improved with magnesium supplement.   Essential hypertension -continue home meds    Multiple myeloma in relapse [facility]) -patient receives infusions for this, was due for one on Tuesday though this will likely be postponed given her infection.    Gastroesophageal reflux disease without esophagitis -on dose PPI   HLD (hyperlipidemia) -Home dose antilipid  Generalized weakness.  Follow-up PT evaluation: HHPT. All the records are reviewed and case discussed with Care  Management/Social Worker. Management plans discussed with the patient, her husband and they are in agreement.  CODE STATUS: DNR  TOTAL TIME TAKING CARE OF THIS PATIENT: 32 minutes.   More than 50% of the time was spent in counseling/coordination of care: YES  POSSIBLE D/C IN 1-2 DAYS, DEPENDING ON CLINICAL CONDITION.   Demetrios Loll M.D on 02/23/2018 at 2:47 PM  Between 7am to 6pm - Pager - [phone]  After 6pm go to [email or web address] - [employer] [facility]

## 2018-02-23 NOTE — Evaluation (Signed)
Physical Therapy Evaluation Patient Details Name: Cassie Jones MRN: [id number] DOB: 02-Jul-1946 Today's Date: 02/23/2018   History of Present Illness  Pt is a 72 y.o. female who presents with fever for the last couple of day with chills and dysuria.  In the ED she was found to have nitrite positive UA indicating UTI and she met sepsis criteria.  Hospitalist called for admission.  Assessment includes:  Sepsis, UTI, HTN, multiple myeloma, GERD, and HLD.      Clinical Impression  Pt presents with mild deficits in strength and gait and with moderate deficits in activity tolerance.  Pt was steady during ambulation with slow cadence without LOB but fatigued and required to return to sitting after max distance of 100'.  Pt's SpO2 and HR were WNL during session both pre and post amb.  Pt will benefit from HHPT services upon discharge to address above deficits for decreased return to PLOF and for decreased risk of further functional decline.      Follow Up Recommendations Home health PT    Equipment Recommendations  None recommended by PT    Recommendations for Other Services       Precautions / Restrictions Precautions Precautions: None Precaution Comments: Per nursing, no chair/bed alarm required Restrictions Weight Bearing Restrictions: No      Mobility  Bed Mobility Overal bed mobility: Independent                Transfers Overall transfer level: Independent                  Ambulation/Gait Ambulation/Gait assistance: Supervision Ambulation Distance (Feet): 100 Feet Assistive device: Rolling walker (2 wheeled) Gait Pattern/deviations: Step-through pattern;Decreased step length - right;Decreased step length - left Gait velocity: Decreased   General Gait Details: Slow cadence with amb but steady without LOB, SpO2 and HR WNL during session  Stairs            Wheelchair Mobility    Modified Rankin (Stroke Patients Only)       Balance Overall  balance assessment: No apparent balance deficits (not formally assessed)                                           Pertinent Vitals/Pain Pain Assessment: No/denies pain    Home Living Family/patient expects to be discharged to:: Private residence Living Arrangements: Alone Available Help at Discharge: Family;Available PRN/intermittently Type of Home: House Home Access: Level entry     Home Layout: One level Home Equipment: Walker - 2 wheels      Prior Function Level of Independence: Independent         Comments: Ind amb limited community distances without an AD, Ind with ADLs, no fall history     Hand Dominance        Extremity/Trunk Assessment   Upper Extremity Assessment Upper Extremity Assessment: Overall WFL for tasks assessed    Lower Extremity Assessment Lower Extremity Assessment: Generalized weakness       Communication   Communication: No difficulties  Cognition Arousal/Alertness: Awake/alert Behavior During Therapy: WFL for tasks assessed/performed Overall Cognitive Status: Within Functional Limits for tasks assessed                                        General Comments  Exercises Total Joint Exercises Ankle Circles/Pumps: Strengthening;Both;10 reps Quad Sets: Strengthening;Both;10 reps Long Arc Quad: AROM;Both;10 reps Knee Flexion: AROM;Both;10 reps Marching in Standing: AROM;Both;Seated;Standing;10 reps Other Exercises Other Exercises: Pt education on physiological benefits of activity and activity progression provided   Assessment/Plan    PT Assessment Patient needs continued PT services  PT Problem List Decreased strength;Decreased activity tolerance       PT Treatment Interventions Gait training;Therapeutic activities;Therapeutic exercise;Patient/family education    PT Goals (Current goals can be found in the Care Plan section)  Acute Rehab PT Goals Patient Stated Goal: Improved  endurance PT Goal Formulation: With patient Time For Goal Achievement: 03/08/18 Potential to Achieve Goals: Good    Frequency Min 2X/week   Barriers to discharge        Co-evaluation               AM-PAC PT "6 Clicks" Daily Activity  Outcome Measure Difficulty turning over in bed (including adjusting bedclothes, sheets and blankets)?: None Difficulty moving from lying on back to sitting on the side of the bed? : None Difficulty sitting down on and standing up from a chair with arms (e.g., wheelchair, bedside commode, etc,.)?: None Help needed moving to and from a bed to chair (including a wheelchair)?: None Help needed walking in hospital room?: None Help needed climbing 3-5 steps with a railing? : A Little 6 Click Score: 23    End of Session Equipment Utilized During Treatment: Gait belt Activity Tolerance: Patient tolerated treatment well Patient left: in chair;with family/visitor present;with call bell/phone within reach;Other (comment)(No chair alarm required per nursing) Nurse Communication: Mobility status PT Visit Diagnosis: Muscle weakness (generalized) (M62.81);Difficulty in walking, not elsewhere classified (R26.2)    Time: 5271-2929 PT Time Calculation (min) (ACUTE ONLY): 26 min   Charges:   PT Evaluation $PT Eval Low Complexity: 1 Low PT Treatments $Therapeutic Exercise: 8-22 mins   PT G Codes:        DRoyetta Asal PT, DPT 02/23/18, 11:30 AM

## 2018-02-24 LAB — URINE CULTURE

## 2018-02-24 MED ORDER — AZITHROMYCIN 500 MG PO TABS
500.0000 mg | ORAL_TABLET | Freq: Every day | ORAL | Status: AC
  Administered 2018-02-24: 14:00:00 500 mg via ORAL
  Filled 2018-02-24: qty 1

## 2018-02-24 MED ORDER — AZITHROMYCIN 500 MG PO TABS
250.0000 mg | ORAL_TABLET | Freq: Every day | ORAL | Status: DC
  Administered 2018-02-25: 250 mg via ORAL
  Filled 2018-02-24: qty 1

## 2018-02-24 MED ORDER — CEPHALEXIN 500 MG PO CAPS
500.0000 mg | ORAL_CAPSULE | Freq: Two times a day (BID) | ORAL | Status: DC
  Administered 2018-02-24 – 2018-02-25 (×3): 500 mg via ORAL
  Filled 2018-02-24 (×3): qty 1

## 2018-02-24 NOTE — Care Management Note (Signed)
Case Management Note  Patient Details  Name: Cassie Jones MRN: [id number] Date of Birth: 01-26-1946  Subjective/Objective:                  Admitted to [facility] with the diagnosis of sepsis. Lives with husband, Marcello Moores. They have an apartment here and a home at [facility]. Lase seen Dr. Reino Kent 3 weeks go. Prescriptions are filled at [facility] on [address]. [place] in the past. No skilled nursing. No home oxygen. Cane and rolling walker, if needed. Takes care of all basic activities of daily living herself, can drive, if needed. No falls. Decreased appetite since being in the hospital. Brother Ardyth Gal will transport  Action/Plan: Physical therapy evaluation completed. Recommending home with services. Would like [facility] again if they are in network   Expected Discharge Date:                  Expected Discharge Plan:     In-House Referral:   yes  Discharge planning Services   yes  Post Acute Care Choice:   yes Choice offered to:   ms. [place]  DME Arranged:    DME Agency:     HH Arranged:   yes at discharge [place]:   [place]  Status of Service:     If discussed at [place] of Stay Meetings, dates discussed:    Additional Comments:  Shelbie Ammons, RN MSN CCM Care Management [phone] 02/24/2018, 9:58 AM

## 2018-02-24 NOTE — Plan of Care (Signed)
  Problem: Urinary Elimination: Goal: Signs and symptoms of infection will decrease Outcome: Progressing   Problem: Education: Goal: Knowledge of General Education information will improve Outcome: Progressing   Problem: Health Behavior/Discharge Planning: Goal: Ability to manage health-related needs will improve Outcome: Progressing   Problem: Clinical Measurements: Goal: Ability to maintain clinical measurements within normal limits will improve Outcome: Progressing Goal: Diagnostic test results will improve Outcome: Progressing   Problem: Elimination: Goal: Will not experience complications related to urinary retention Outcome: Progressing   Problem: Safety: Goal: Ability to remain free from injury will improve Outcome: Progressing

## 2018-02-24 NOTE — Care Management Important Message (Signed)
Important Message  Patient Details  Name: Henritta Mutz MRN: [id number] Date of Birth: 11-02-1945   Medicare Important Message Given:  Yes    Shelbie Ammons, RN 02/24/2018, 2:12 PM

## 2018-02-24 NOTE — Progress Notes (Signed)
[place] at [place] NAME: Cassie Jones    MR#:  [id number]  DATE OF BIRTH:  [birth year]  SUBJECTIVE: Complains of diarrhea 3 times at night temp was 101.8 yesterday evening.  She feels very tired and rundown.  No abdominal pain.  CHIEF COMPLAINT:   Chief Complaint  Patient presents with  . Fever    REVIEW OF SYSTEMS:  Review of Systems  Constitutional: Positive for malaise/fatigue. Negative for chills and fever.  HENT: Negative for sore throat.   Eyes: Negative for blurred vision and double vision.  Respiratory: Negative for cough, hemoptysis, shortness of breath, wheezing and stridor.   Cardiovascular: Negative for chest pain, palpitations, orthopnea and leg swelling.  Gastrointestinal: Positive for diarrhea. Negative for abdominal pain, blood in stool, melena, nausea and vomiting.  Genitourinary: Negative for dysuria, flank pain, hematuria and urgency.  Musculoskeletal: Negative for back pain and joint pain.  Skin: Negative for rash.  Neurological: Negative for dizziness, sensory change, focal weakness, seizures, loss of consciousness, weakness and headaches.  Endo/Heme/Allergies: Negative for polydipsia.  Psychiatric/Behavioral: Negative for depression. The patient is not nervous/anxious.     DRUG ALLERGIES:   Allergies  Allergen Reactions  . Codeine Nausea And Vomiting   VITALS:  Blood pressure (!) 140/55, pulse 77, temperature 97.7 F (36.5 C), temperature source Oral, resp. rate 18, height '5\' 2"'$  (1.575 m), weight 60.2 kg (132 lb 12.8 oz), SpO2 95 %. PHYSICAL EXAMINATION:  Physical Exam  Constitutional: She is oriented to person, place, and time.  HENT:  Head: Normocephalic.  Mouth/Throat: Oropharynx is clear and moist.  Eyes: Pupils are equal, round, and reactive to light. Conjunctivae and EOM are normal. No scleral icterus.  Neck: Normal range of motion. Neck supple. No JVD present. No tracheal deviation present.    Cardiovascular: Normal rate, regular rhythm and normal heart sounds. Exam reveals no gallop.  No murmur heard. Pulmonary/Chest: Effort normal and breath sounds normal. No respiratory distress. She has no wheezes. She has no rales.  Abdominal: Soft. Bowel sounds are normal. She exhibits no distension. There is no tenderness. There is no rebound.  Musculoskeletal: Normal range of motion. She exhibits no edema or tenderness.  Neurological: She is alert and oriented to person, place, and time. No cranial nerve deficit.  Skin: No rash noted. No erythema.  Psychiatric: She has a normal mood and affect.   LABORATORY PANEL:  Female CBC Recent Labs  Lab 02/22/18 0425  WBC 3.7  HGB 12.3  HCT 35.3  PLT 156   ------------------------------------------------------------------------------------------------------------------ Chemistries  Recent Labs  Lab 02/21/18 2006  02/23/18 0454  NA 136   < > 132*  K 3.2*   < > 4.0  CL 103   < > 102  CO2 25   < > 24  GLUCOSE 117*   < > 110*  BUN 10   < > 7  CREATININE 0.71   < > 0.62  CALCIUM 8.9   < > 7.9*  MG  --    < > 1.8  AST 15  --   --   ALT 14  --   --   ALKPHOS 63  --   --   BILITOT 1.3*  --   --    < > = values in this interval not displayed.   RADIOLOGY:  No results found. ASSESSMENT AND PLAN:   Sepsis due to UTI Continue Rocephin, follow-up urine culture: >=100,000 COLONIES/mL KLEBSIELLA PNEUMONIAE  and blood  culture are negative so far.  Antibiotics changed to Keflex today based on sensitivity result.  Still having high fever.  Add azithromycin because patient has cough likely has acute bronchitis.  Hypokalemia.  Improved with potassium supplement. Hypomagnesemia.  Improved with magnesium supplement.   Essential hypertension -continue home meds    Multiple myeloma in relapse [facility]) -patient receives infusions for this, was due for one on Tuesday though this will likely be postponed given her infection.  History of recent DVT in  the right arm, patient is taking Eliquis, patient is advised to avoid driving as per Dr. Tasia Catchings.  Patient swelling of right hand improved.  Wondering if she can drive.  Need to confirm with Dr. Tasia Catchings before discharge whether she can start driving.    Gastroesophageal reflux disease without esophagitis -on dose PPI   HLD (hyperlipidemia) -Home dose antilipid  Generalized weakness.  Follow-up PT evaluation: HHPT.  D/w husband   All the records are reviewed and case discussed with Care Management/Social Worker. Management plans discussed with the patient, her husband and they are in agreement.  CODE STATUS: DNR  TOTAL TIME TAKING CARE OF THIS PATIENT: 32 minutes.   More than 50% of the time was spent in counseling/coordination of care: YES  POSSIBLE D/C IN 1-2 DAYS, DEPENDING ON CLINICAL CONDITION.   Epifanio Lesches M.D on 02/24/2018 at 1:45 PM  Between 7am to 6pm - Pager - [phone]  After 6pm go to [email or web address] - [employer] [facility]

## 2018-02-25 ENCOUNTER — Institutional Professional Consult (permissible substitution): Payer: Medicare PPO | Admitting: Internal Medicine

## 2018-02-25 LAB — BASIC METABOLIC PANEL
ANION GAP: 10 (ref 5–15)
BUN: 8 mg/dL (ref 6–20)
CALCIUM: 7.9 mg/dL — AB (ref 8.9–10.3)
CO2: 21 mmol/L — ABNORMAL LOW (ref 22–32)
Chloride: 96 mmol/L — ABNORMAL LOW (ref 101–111)
Creatinine, Ser: 0.67 mg/dL (ref 0.44–1.00)
GFR calc Af Amer: >60 mL/min (ref >60)
Glucose, Bld: 108 mg/dL — ABNORMAL HIGH (ref 65–99)
Potassium: 3.5 mmol/L (ref 3.5–5.1)
SODIUM: 127 mmol/L — AB (ref 135–145)

## 2018-02-25 LAB — MAGNESIUM: MAGNESIUM: 1.8 mg/dL (ref 1.7–2.4)

## 2018-02-25 LAB — PHOSPHORUS: Phosphorus: 2.1 mg/dL — ABNORMAL LOW (ref 2.5–4.6)

## 2018-02-25 MED ORDER — CEPHALEXIN 500 MG PO CAPS: 500.0000 mg | ORAL_CAPSULE | Freq: Two times a day (BID) | ORAL | 0 refills | Status: DC

## 2018-02-25 NOTE — Progress Notes (Signed)
Discharge instructions given and went over with patient at bedside. Prescription given and reviewed. All questions answered. Patient to discharge home. Awaiting transportation. Madlyn Frankel, RN

## 2018-02-25 NOTE — Care Management (Signed)
Discharge to home today per Dr. Verdell Carmine. Would like [facility] to come to her apartment. Telephone call to Sharmon Revere, [facility] representative. Ms. [facility] Address is: [place]  Telephone Number# [phone] Shelbie Ammons RN MSN CCM Care Management [phone]

## 2018-02-25 NOTE — Progress Notes (Signed)
Patient discharged home via wheelchair by volunteer services. Liandra Mendia S, RN  

## 2018-02-25 NOTE — Progress Notes (Signed)
Home medication returned from pharmacy. Madlyn Frankel, RN

## 2018-02-25 NOTE — Discharge Summary (Signed)
[name]    MR#:  [id number]  DATE OF BIRTH:  [birth year]  DATE OF ADMISSION:  02/21/2018 ADMITTING PHYSICIAN: Cassie Coon, MD  DATE OF DISCHARGE: 02/25/2018  PRIMARY CARE PHYSICIAN: McLean-Scocuzza, Nino Glow, MD    ADMISSION DIAGNOSIS:  Acute cystitis without hematuria [N30.00]  DISCHARGE DIAGNOSIS:  Principal Problem:   Sepsis ([place]) Active Problems:   Essential hypertension   Gastroesophageal reflux disease without esophagitis   HLD (hyperlipidemia)   Multiple myeloma in relapse ([place])   UTI (urinary tract infection)   SECONDARY DIAGNOSIS:   Past Medical History:  Diagnosis Date  . Ascites   . Asthma   . Bilateral leg edema   . Cancer ([place])   . Chicken pox   . Colon polyps   . Diverticulitis    with perforation 02/2017 and hosp with colostomy bag s/p removal   . GERD (gastroesophageal reflux disease)   . Hyperlipidemia   . Hypertension   . Multiple myeloma ([place])    dx'ed 01/2018 follows Dr. Tasia Jones and [facility] H/O   . Pleural effusion   . Thyroid nodule   . UTI (urinary tract infection)   . Vitamin D deficiency     HOSPITAL COURSE:   72 year old female with past medical history of multiple myeloma currently undergoing treatment, history of hypertension, hyperlipidemia, GERD, diverticulitis who presented to the hospital due to fever chills, dysuria noted to have a urinary tract infection.  1.  Sepsis-patient presented to the hospital with fever, tachycardia, abnormal urinalysis consistent with UTI. - Patient was treated with IV ceftriaxone, which is a positive for Klebsiella UTI and patient has been switched over to oral Keflex and now being discharged on that.  Currently afebrile and hemodynamically stable.  2.  Urinary tract infection-source of patient's sepsis.  She was on IV ceftriaxone, now switched over to oral Keflex and being discharged home on that.  3.  Hypokalemia/hypomagnesemia- this has  been supplemented and has improved now.   4.  Multiple myeloma- patient will continue follow-up with oncology as an outpatient for ongoing chemotherapy.  Patient is currently on treatment.  Follows with Dr. Tasia Jones. -Continue OxyContin, oxycodone for chronic pain control.  5.  Hyperlipidemia-patient will continue Pravachol.  6.  Right upper extremity DVT-this is based off an ultrasound done in end of April.  Patient will continue her Eliquis.  7.  GERD-patient will continue Protonix.   DISCHARGE CONDITIONS:   Stable  CONSULTS OBTAINED:    DRUG ALLERGIES:   Allergies  Allergen Reactions  . Codeine Nausea And Vomiting    DISCHARGE MEDICATIONS:   Allergies as of 02/25/2018      Reactions   Codeine Nausea And Vomiting      Medication List    TAKE these medications   acetaminophen 500 MG tablet Commonly known as:  TYLENOL Take 1,000 mg by mouth every 6 (six) hours as needed for mild pain or fever.   albuterol 108 (90 Base) MCG/ACT inhaler Commonly known as:  VENTOLIN HFA Inhale 2 puffs into the lungs every 6 (six) hours as needed.   apixaban 5 MG Tabs tablet Commonly known as:  ELIQUIS Take 2 tablets ('10mg'$ ) twice a day for 7 days. Take 1 tablet ('5mg'$ ) twice daily after first week.   cephALEXin 500 MG capsule Commonly known as:  KEFLEX Take 1 capsule (500 mg total) by mouth 2 (two) times daily for 5 days.   fluticasone 50 MCG/BLIST diskus inhaler  Commonly known as:  FLOVENT DISKUS Inhale 1 puff into the lungs 2 (two) times daily.   furosemide 20 MG tablet Commonly known as:  LASIX Take 1 tablet (20 mg total) by mouth daily as needed. What changed:  reasons to take this   loperamide 2 MG tablet Commonly known as:  IMODIUM A-D Take 2 mg by mouth 4 (four) times daily as needed for diarrhea or loose stools.   montelukast 10 MG tablet Commonly known as:  SINGULAIR Take 10 mg by mouth at bedtime.   ondansetron 4 MG disintegrating tablet Commonly known as:   ZOFRAN-ODT Take 4 mg by mouth every 12 (twelve) hours as needed for nausea.   ondansetron 8 MG tablet Commonly known as:  ZOFRAN Take 8 mg by mouth every 8 (eight) hours as needed for nausea.   oxyCODONE 5 MG immediate release tablet Commonly known as:  Oxy IR/ROXICODONE Take 5 mg by mouth 3 (three) times daily as needed for moderate pain.   oxyCODONE 10 mg 12 hr tablet Commonly known as:  OXYCONTIN Take 10 mg by mouth every 12 (twelve) hours as needed (severe pain).   pantoprazole 40 MG tablet Commonly known as:  PROTONIX Take 1 tablet by mouth every morning.   POMALYST 4 MG capsule Generic drug:  pomalidomide Take 1 capsule by mouth with water every day on days 1-21 every 28 days. What changed:    how much to take  how to take this  when to take this  additional instructions   pravastatin 10 MG tablet Commonly known as:  PRAVACHOL Take 1 tablet by mouth at bedtime.   prochlorperazine 10 MG tablet Commonly known as:  COMPAZINE Take 10 mg by mouth every 6 (six) hours as needed for nausea.   spironolactone 25 MG tablet Commonly known as:  ALDACTONE Take 1 tablet (25 mg total) by mouth daily.         DISCHARGE INSTRUCTIONS:   DIET:  Cardiac diet  DISCHARGE CONDITION:  Stable  ACTIVITY:  Activity as tolerated  OXYGEN:  Home Oxygen: No.   Oxygen Delivery: room air  DISCHARGE LOCATION:  Home with home health nursing, physical therapy.  If you experience worsening of your admission symptoms, develop shortness of breath, life threatening emergency, suicidal or homicidal thoughts you must seek medical attention immediately by calling 911 or calling your MD immediately  if symptoms less severe.  You Must read complete instructions/literature along with all the possible adverse reactions/side effects for all the Medicines you take and that have been prescribed to you. Take any new Medicines after you have completely understood and accpet all the possible  adverse reactions/side effects.   Please note  You were cared for by a hospitalist during your hospital stay. If you have any questions about your discharge medications or the care you received while you were in the hospital after you are discharged, you can call the unit and asked to speak with the hospitalist on call if the hospitalist that took care of you is not available. Once you are discharged, your primary care physician will handle any further medical issues. Please note that NO REFILLS for any discharge medications will be authorized once you are discharged, as it is imperative that you return to your primary care physician (or establish a relationship with a primary care physician if you do not have one) for your aftercare needs so that they can reassess your need for medications and monitor your lab values.  Today   No acute events overnight.  Patient had a mild low-grade fever but now resolved.  Will DC home on oral antibiotics today with home health services.  VITAL SIGNS:  Blood pressure (!) 128/56, pulse 91, temperature 99 F (37.2 C), temperature source Oral, resp. rate 18, height '5\' 2"'$  (1.575 m), weight 60.2 kg (132 lb 12.8 oz), SpO2 94 %.  I/O:  No intake or output data in the 24 hours ending 02/25/18 1451  PHYSICAL EXAMINATION:  GENERAL:  72 y.o.-year-old patient lying in the bed with no acute distress.  EYES: Pupils equal, round, reactive to light and accommodation. No scleral icterus. Extraocular muscles intact.  HEENT: Head atraumatic, normocephalic. Oropharynx and nasopharynx clear.  NECK:  Supple, no jugular venous distention. No thyroid enlargement, no tenderness.  LUNGS: Normal breath sounds bilaterally, no wheezing, rales, rhonchi. No use of accessory muscles of respiration.  CARDIOVASCULAR: S1, S2 normal. No murmurs, rubs, or gallops.  ABDOMEN: Soft, non-tender, non-distended. Bowel sounds present. No organomegaly or mass.  EXTREMITIES: No pedal edema,  cyanosis, or clubbing.  NEUROLOGIC: Cranial nerves II through XII are intact. No focal motor or sensory defecits b/l.  PSYCHIATRIC: The patient is alert and oriented x 3.  SKIN: No obvious rash, lesion, or ulcer.   DATA REVIEW:   CBC Recent Labs  Lab 02/22/18 0425  WBC 3.7  HGB 12.3  HCT 35.3  PLT 156    Chemistries  Recent Labs  Lab 02/21/18 2006  02/25/18 0802  NA 136   < > 127*  K 3.2*   < > 3.5  CL 103   < > 96*  CO2 25   < > 21*  GLUCOSE 117*   < > 108*  BUN 10   < > 8  CREATININE 0.71   < > 0.67  CALCIUM 8.9   < > 7.9*  MG  --    < > 1.8  AST 15  --   --   ALT 14  --   --   ALKPHOS 63  --   --   BILITOT 1.3*  --   --    < > = values in this interval not displayed.    Cardiac Enzymes No results for input(s): TROPONINI in the last 168 hours.  Microbiology Results  Results for orders placed or performed during the hospital encounter of 02/21/18  Blood Culture (routine x 2)     Status: None (Preliminary result)   Collection Time: 02/21/18  8:06 PM  Result Value Ref Range Status   Specimen Description BLOOD BLOOD LEFT HAND  Final   Special Requests   Final    BOTTLES DRAWN AEROBIC AND ANAEROBIC Blood Culture adequate volume   Culture   Final    NO GROWTH 4 DAYS Performed at [facility], [address], [place], [place] [address]    Report Status PENDING  Incomplete  Blood Culture (routine x 2)     Status: None (Preliminary result)   Collection Time: 02/21/18  8:08 PM  Result Value Ref Range Status   Specimen Description BLOOD LEFT ANTECUBITAL  Final   Special Requests   Final    BOTTLES DRAWN AEROBIC AND ANAEROBIC Blood Culture adequate volume   Culture   Final    NO GROWTH 4 DAYS Performed at [facility], [address] [place],  [address]    Report Status PENDING  Incomplete  Urine culture     Status: Abnormal   Collection Time:  02/21/18  9:02 PM  Result Value Ref Range Status   Specimen Description   Final     URINE, CATHETERIZED Performed at [facility], [address] [place], [place] [address]    Special Requests   Final    NONE Performed at [facility], [place], [place] [address]    Culture >=100,000 COLONIES/mL KLEBSIELLA PNEUMONIAE (A)  Final   Report Status 02/24/2018 FINAL  Final   Organism ID, Bacteria KLEBSIELLA PNEUMONIAE (A)  Final      Susceptibility   Klebsiella pneumoniae - MIC*    AMPICILLIN RESISTANT Resistant     CEFAZOLIN <=4 SENSITIVE Sensitive     CEFTRIAXONE <=1 SENSITIVE Sensitive     CIPROFLOXACIN <=0.25 SENSITIVE Sensitive     GENTAMICIN <=1 SENSITIVE Sensitive     IMIPENEM <=0.25 SENSITIVE Sensitive     NITROFURANTOIN 64 INTERMEDIATE Intermediate     TRIMETH/SULFA <=20 SENSITIVE Sensitive     AMPICILLIN/SULBACTAM <=2 SENSITIVE Sensitive     PIP/TAZO <=4 SENSITIVE Sensitive     Extended ESBL NEGATIVE Sensitive     * >=100,000 COLONIES/mL KLEBSIELLA PNEUMONIAE    RADIOLOGY:  No results found.    Management plans discussed with the patient, family and they are in agreement.  CODE STATUS:     Code Status Orders  (From admission, onward)        Start     Ordered   02/22/18 1419  Do not attempt resuscitation (DNR)  Continuous    Question Answer Comment  In the event of cardiac or respiratory ARREST Do not call a "code blue"   In the event of cardiac or respiratory ARREST Do not perform Intubation, CPR, defibrillation or ACLS   In the event of cardiac or respiratory ARREST Use medication by any route, position, wound care, and other measures to relive pain and suffering. May use oxygen, suction and manual treatment of airway obstruction as needed for comfort.      02/22/18 1418  Advance Directive Documentation     Most Recent Value  Type of Advance Directive  Healthcare Power of Attorney  Pre-existing out of facility DNR order (yellow form or pink MOST form)  -  "MOST" Form in Place?  -      TOTAL TIME TAKING  CARE OF THIS PATIENT: 40 minutes.    Henreitta Leber M.D on 02/25/2018 at 2:51 PM  Between 7am to 6pm - Pager - [phone]  After 6pm go to [email or web address] - Proofreader  [facility]  Office  [phone]  CC: Primary care physician; McLean-Scocuzza, Nino Glow, MD

## 2018-02-26 ENCOUNTER — Ambulatory Visit: Payer: Medicare PPO | Admitting: Radiation Oncology

## 2018-02-26 ENCOUNTER — Telehealth: Payer: Self-pay | Admitting: Internal Medicine

## 2018-02-26 LAB — CULTURE, BLOOD (ROUTINE X 2)
CULTURE: NO GROWTH
CULTURE: NO GROWTH
SPECIAL REQUESTS: ADEQUATE
Special Requests: ADEQUATE

## 2018-02-26 NOTE — Telephone Encounter (Signed)
First attempt made for TCM call left message for patient to call office.

## 2018-03-02 ENCOUNTER — Ambulatory Visit: Payer: Medicare PPO | Admitting: Internal Medicine

## 2018-03-02 ENCOUNTER — Telehealth: Payer: Self-pay

## 2018-03-02 ENCOUNTER — Encounter: Payer: Self-pay | Admitting: Internal Medicine

## 2018-03-02 ENCOUNTER — Ambulatory Visit (INDEPENDENT_AMBULATORY_CARE_PROVIDER_SITE_OTHER): Payer: Medicare PPO

## 2018-03-02 ENCOUNTER — Other Ambulatory Visit: Payer: Self-pay

## 2018-03-02 ENCOUNTER — Inpatient Hospital Stay
Admission: EM | Admit: 2018-03-02 | Discharge: 2018-03-05 | DRG: 871 | Disposition: A | Discharge status: Home Health | Payer: Medicare PPO | Attending: Internal Medicine | Admitting: Internal Medicine

## 2018-03-02 VITALS — BP 98/56 | HR 89 | Temp 97.7°F | Ht 62.0 in | Wt 125.8 lb

## 2018-03-02 DIAGNOSIS — D61810 Antineoplastic chemotherapy induced pancytopenia: Secondary | ICD-10-CM | POA: Diagnosis present

## 2018-03-02 DIAGNOSIS — K219 Gastro-esophageal reflux disease without esophagitis: Secondary | ICD-10-CM | POA: Diagnosis present

## 2018-03-02 DIAGNOSIS — T451X5A Adverse effect of antineoplastic and immunosuppressive drugs, initial encounter: Secondary | ICD-10-CM | POA: Diagnosis present

## 2018-03-02 DIAGNOSIS — E785 Hyperlipidemia, unspecified: Secondary | ICD-10-CM | POA: Diagnosis present

## 2018-03-02 DIAGNOSIS — Z66 Do not resuscitate: Secondary | ICD-10-CM | POA: Diagnosis present

## 2018-03-02 DIAGNOSIS — Z8249 Family history of ischemic heart disease and other diseases of the circulatory system: Secondary | ICD-10-CM

## 2018-03-02 DIAGNOSIS — J181 Lobar pneumonia, unspecified organism: Secondary | ICD-10-CM | POA: Diagnosis present

## 2018-03-02 DIAGNOSIS — R5383 Other fatigue: Secondary | ICD-10-CM | POA: Diagnosis not present

## 2018-03-02 DIAGNOSIS — R05 Cough: Secondary | ICD-10-CM

## 2018-03-02 DIAGNOSIS — D701 Agranulocytosis secondary to cancer chemotherapy: Secondary | ICD-10-CM

## 2018-03-02 DIAGNOSIS — J189 Pneumonia, unspecified organism: Secondary | ICD-10-CM | POA: Diagnosis not present

## 2018-03-02 DIAGNOSIS — E871 Hypo-osmolality and hyponatremia: Secondary | ICD-10-CM

## 2018-03-02 DIAGNOSIS — Z7901 Long term (current) use of anticoagulants: Secondary | ICD-10-CM

## 2018-03-02 DIAGNOSIS — D61818 Other pancytopenia: Secondary | ICD-10-CM | POA: Diagnosis not present

## 2018-03-02 DIAGNOSIS — Z87440 Personal history of urinary (tract) infections: Secondary | ICD-10-CM

## 2018-03-02 DIAGNOSIS — Z885 Allergy status to narcotic agent status: Secondary | ICD-10-CM

## 2018-03-02 DIAGNOSIS — A419 Sepsis, unspecified organism: Secondary | ICD-10-CM | POA: Diagnosis not present

## 2018-03-02 DIAGNOSIS — I10 Essential (primary) hypertension: Secondary | ICD-10-CM | POA: Diagnosis present

## 2018-03-02 DIAGNOSIS — C9000 Multiple myeloma not having achieved remission: Secondary | ICD-10-CM | POA: Diagnosis present

## 2018-03-02 DIAGNOSIS — J45909 Unspecified asthma, uncomplicated: Secondary | ICD-10-CM | POA: Diagnosis present

## 2018-03-02 DIAGNOSIS — E876 Hypokalemia: Secondary | ICD-10-CM | POA: Diagnosis present

## 2018-03-02 DIAGNOSIS — Z86010 Personal history of colonic polyps: Secondary | ICD-10-CM

## 2018-03-02 DIAGNOSIS — R059 Cough, unspecified: Secondary | ICD-10-CM

## 2018-03-02 DIAGNOSIS — E559 Vitamin D deficiency, unspecified: Secondary | ICD-10-CM

## 2018-03-02 DIAGNOSIS — Z7951 Long term (current) use of inhaled steroids: Secondary | ICD-10-CM | POA: Diagnosis not present

## 2018-03-02 DIAGNOSIS — Z8349 Family history of other endocrine, nutritional and metabolic diseases: Secondary | ICD-10-CM

## 2018-03-02 DIAGNOSIS — Z87891 Personal history of nicotine dependence: Secondary | ICD-10-CM | POA: Diagnosis not present

## 2018-03-02 DIAGNOSIS — Z86718 Personal history of other venous thrombosis and embolism: Secondary | ICD-10-CM

## 2018-03-02 DIAGNOSIS — C9002 Multiple myeloma in relapse: Secondary | ICD-10-CM | POA: Diagnosis not present

## 2018-03-02 DIAGNOSIS — Z90710 Acquired absence of both cervix and uterus: Secondary | ICD-10-CM | POA: Diagnosis not present

## 2018-03-02 DIAGNOSIS — Z79899 Other long term (current) drug therapy: Secondary | ICD-10-CM

## 2018-03-02 DIAGNOSIS — R531 Weakness: Secondary | ICD-10-CM | POA: Diagnosis not present

## 2018-03-02 DIAGNOSIS — R0981 Nasal congestion: Secondary | ICD-10-CM | POA: Diagnosis not present

## 2018-03-02 DIAGNOSIS — C7951 Secondary malignant neoplasm of bone: Secondary | ICD-10-CM | POA: Diagnosis not present

## 2018-03-02 DIAGNOSIS — F329 Major depressive disorder, single episode, unspecified: Secondary | ICD-10-CM | POA: Diagnosis not present

## 2018-03-02 DIAGNOSIS — J439 Emphysema, unspecified: Secondary | ICD-10-CM | POA: Diagnosis present

## 2018-03-02 DIAGNOSIS — R0602 Shortness of breath: Secondary | ICD-10-CM

## 2018-03-02 DIAGNOSIS — Z803 Family history of malignant neoplasm of breast: Secondary | ICD-10-CM | POA: Diagnosis not present

## 2018-03-02 DIAGNOSIS — Z8619 Personal history of other infectious and parasitic diseases: Secondary | ICD-10-CM | POA: Diagnosis not present

## 2018-03-02 DIAGNOSIS — R739 Hyperglycemia, unspecified: Secondary | ICD-10-CM

## 2018-03-02 DIAGNOSIS — R5381 Other malaise: Secondary | ICD-10-CM | POA: Diagnosis not present

## 2018-03-02 LAB — BRAIN NATRIURETIC PEPTIDE: B Natriuretic Peptide: 21 pg/mL (ref 0.0–100.0)

## 2018-03-02 LAB — LACTIC ACID, PLASMA
LACTIC ACID, VENOUS: 2 mmol/L — AB (ref 0.5–1.9)
Lactic Acid, Venous: 2.2 mmol/L (ref 0.5–1.9)

## 2018-03-02 LAB — CBC WITH DIFFERENTIAL/PLATELET
BASOS PCT: 0.5 % (ref 0.0–3.0)
Basophils Absolute: 0 10*3/uL (ref 0.0–0.1)
Basophils Absolute: 0 10*3/uL (ref 0–0.1)
Basophils Relative: 1 %
EOS ABS: 0.6 10*3/uL (ref 0.0–0.7)
EOS ABS: 0.4 10*3/uL (ref 0–0.7)
EOS PCT: 24 %
HCT: 35.5 % (ref 35.0–47.0)
HEMATOCRIT: 37.6 % (ref 36.0–46.0)
HEMOGLOBIN: 12.6 g/dL (ref 12.0–15.0)
Hemoglobin: 12.2 g/dL (ref 12.0–16.0)
LYMPHS ABS: 0.5 10*3/uL — AB (ref 0.7–4.0)
LYMPHS ABS: 0.4 10*3/uL — AB (ref 1.0–3.6)
Lymphocytes Relative: 23.7 % (ref 12.0–46.0)
Lymphocytes Relative: 23 %
MCH: 30.1 pg (ref 26.0–34.0)
MCHC: 33.5 g/dL (ref 30.0–36.0)
MCHC: 34.4 g/dL (ref 32.0–36.0)
MCV: 88 fl (ref 78.0–100.0)
MCV: 87.3 fL (ref 80.0–100.0)
MONO ABS: 0.4 10*3/uL (ref 0.1–1.0)
MONO ABS: 0.3 10*3/uL (ref 0.2–0.9)
MONOS PCT: 16 %
Monocytes Relative: 17.6 % — ABNORMAL HIGH (ref 3.0–12.0)
NEUTROS ABS: 0.7 10*3/uL — AB (ref 1.4–7.7)
Neutro Abs: 0.6 10*3/uL — ABNORMAL LOW (ref 1.4–6.5)
Neutrophils Relative %: 30.9 % — ABNORMAL LOW (ref 43.0–77.0)
Neutrophils Relative %: 36 %
PLATELETS: 155 10*3/uL (ref 150–440)
Platelets: 147 10*3/uL — ABNORMAL LOW (ref 150.0–400.0)
RBC: 4.27 Mil/uL (ref 3.87–5.11)
RBC: 4.07 MIL/uL (ref 3.80–5.20)
RDW: 15.5 % (ref 11.5–15.5)
RDW: 15.6 % — AB (ref 11.5–14.5)
WBC: 2.2 10*3/uL — ABNORMAL LOW (ref 4.0–10.5)
WBC: 1.6 10*3/uL — AB (ref 3.6–11.0)

## 2018-03-02 LAB — COMPREHENSIVE METABOLIC PANEL
ALBUMIN: 2.9 g/dL — AB (ref 3.5–5.0)
ALT: 16 U/L (ref 14–54)
AST: 20 U/L (ref 15–41)
Alkaline Phosphatase: 55 U/L (ref 38–126)
Anion gap: 11 (ref 5–15)
BUN: 14 mg/dL (ref 6–20)
CHLORIDE: 96 mmol/L — AB (ref 101–111)
CO2: 27 mmol/L (ref 22–32)
CREATININE: 0.83 mg/dL (ref 0.44–1.00)
Calcium: 8.6 mg/dL — ABNORMAL LOW (ref 8.9–10.3)
GFR calc Af Amer: >60 mL/min (ref >60)
GLUCOSE: 129 mg/dL — AB (ref 65–99)
Potassium: 3 mmol/L — ABNORMAL LOW (ref 3.5–5.1)
Sodium: 134 mmol/L — ABNORMAL LOW (ref 135–145)
Total Bilirubin: 1.3 mg/dL — ABNORMAL HIGH (ref 0.3–1.2)
Total Protein: 6.5 g/dL (ref 6.5–8.1)

## 2018-03-02 LAB — APTT: aPTT: 43 seconds — ABNORMAL HIGH (ref 24–36)

## 2018-03-02 LAB — BASIC METABOLIC PANEL
BUN: 12 mg/dL (ref 6–23)
CHLORIDE: 97 meq/L (ref 96–112)
CO2: 29 mEq/L (ref 19–32)
CREATININE: 0.83 mg/dL (ref 0.40–1.20)
Calcium: 9 mg/dL (ref 8.4–10.5)
GFR: 72.1 mL/min (ref >60.00)
GLUCOSE: 119 mg/dL — AB (ref 70–99)
Potassium: 3 mEq/L — ABNORMAL LOW (ref 3.5–5.1)
Sodium: 138 mEq/L (ref 135–145)

## 2018-03-02 LAB — PROCALCITONIN
PROCALCITONIN: 0.38 ng/mL
PROCALCITONIN: 0.3 ng/mL

## 2018-03-02 LAB — HEMOGLOBIN A1C: Hgb A1c MFr Bld: 6 % (ref 4.6–6.5)

## 2018-03-02 LAB — MAGNESIUM
MAGNESIUM: 1.6 mg/dL — AB (ref 1.7–2.4)
Magnesium: 2.1 mg/dL (ref 1.5–2.5)

## 2018-03-02 LAB — TROPONIN I: Troponin I: <0.03 ng/mL (ref <0.03)

## 2018-03-02 LAB — PROTIME-INR
INR: 1.33
PROTHROMBIN TIME: 16.4 s — AB (ref 11.4–15.2)

## 2018-03-02 MED ORDER — FLUTICASONE PROPIONATE 50 MCG/ACT NA SUSP: 1.0000 | Freq: Every day | NASAL | 2 refills | Status: AC

## 2018-03-02 MED ORDER — LOPERAMIDE HCL 2 MG PO CAPS: 2.0000 mg | ORAL_CAPSULE | Freq: Four times a day (QID) | ORAL | Status: DC | PRN

## 2018-03-02 MED ORDER — ACETAMINOPHEN 650 MG RE SUPP: 650.0000 mg | Freq: Four times a day (QID) | RECTAL | Status: DC | PRN

## 2018-03-02 MED ORDER — GUAIFENESIN 100 MG/5ML PO SOLN
5.0000 mL | ORAL | Status: DC | PRN
  Filled 2018-03-02 (×5): qty 5

## 2018-03-02 MED ORDER — LEVOFLOXACIN 750 MG PO TABS: 750.0000 mg | ORAL_TABLET | ORAL | 0 refills | Status: DC

## 2018-03-02 MED ORDER — SODIUM CHLORIDE 0.9 % IV BOLUS (SEPSIS)
1000.0000 mL | Freq: Once | INTRAVENOUS | Status: AC
  Administered 2018-03-02: 1000 mL via INTRAVENOUS

## 2018-03-02 MED ORDER — POTASSIUM CHLORIDE CRYS ER 20 MEQ PO TBCR
40.0000 meq | EXTENDED_RELEASE_TABLET | Freq: Once | ORAL | Status: AC
  Administered 2018-03-02: 40 meq via ORAL
  Filled 2018-03-02: qty 2

## 2018-03-02 MED ORDER — BENZONATATE 200 MG PO CAPS: 200.0000 mg | ORAL_CAPSULE | Freq: Three times a day (TID) | ORAL | 0 refills | Status: DC | PRN

## 2018-03-02 MED ORDER — SENNOSIDES-DOCUSATE SODIUM 8.6-50 MG PO TABS: 1.0000 | ORAL_TABLET | Freq: Every evening | ORAL | Status: DC | PRN

## 2018-03-02 MED ORDER — ONDANSETRON HCL 4 MG/2ML IJ SOLN
4.0000 mg | Freq: Four times a day (QID) | INTRAMUSCULAR | Status: DC | PRN
  Administered 2018-03-03 – 2018-03-04 (×2): 4 mg via INTRAVENOUS
  Filled 2018-03-02 (×2): qty 2

## 2018-03-02 MED ORDER — ONDANSETRON HCL 4 MG PO TABS: 4.0000 mg | ORAL_TABLET | Freq: Four times a day (QID) | ORAL | Status: DC | PRN

## 2018-03-02 MED ORDER — SODIUM CHLORIDE 0.9 % IV BOLUS (SEPSIS)
500.0000 mL | Freq: Once | INTRAVENOUS | Status: AC
  Administered 2018-03-02: 500 mL via INTRAVENOUS

## 2018-03-02 MED ORDER — FLUTICASONE PROPIONATE (INHAL) 50 MCG/BLIST IN AEPB: 1.0000 | INHALATION_SPRAY | Freq: Two times a day (BID) | RESPIRATORY_TRACT | Status: DC

## 2018-03-02 MED ORDER — BISACODYL 5 MG PO TBEC: 5.0000 mg | DELAYED_RELEASE_TABLET | Freq: Every day | ORAL | Status: DC | PRN

## 2018-03-02 MED ORDER — BUDESONIDE 0.25 MG/2ML IN SUSP
0.2500 mg | Freq: Two times a day (BID) | RESPIRATORY_TRACT | Status: DC
  Administered 2018-03-03 – 2018-03-05 (×5): 0.25 mg via RESPIRATORY_TRACT
  Filled 2018-03-02 (×5): qty 2

## 2018-03-02 MED ORDER — OXYCODONE HCL ER 10 MG PO T12A: 10.0000 mg | EXTENDED_RELEASE_TABLET | Freq: Two times a day (BID) | ORAL | Status: DC | PRN

## 2018-03-02 MED ORDER — APIXABAN 5 MG PO TABS
5.0000 mg | ORAL_TABLET | Freq: Two times a day (BID) | ORAL | Status: DC
  Administered 2018-03-02 – 2018-03-05 (×6): 5 mg via ORAL
  Filled 2018-03-02 (×6): qty 1

## 2018-03-02 MED ORDER — IPRATROPIUM-ALBUTEROL 0.5-2.5 (3) MG/3ML IN SOLN
3.0000 mL | Freq: Once | RESPIRATORY_TRACT | Status: AC
Start: 2018-03-02 — End: 2018-03-02
  Administered 2018-03-02: 3 mL via RESPIRATORY_TRACT

## 2018-03-02 MED ORDER — SODIUM CHLORIDE 0.9 % IV SOLN
INTRAVENOUS | Status: DC
  Administered 2018-03-02: 23:00:00 via INTRAVENOUS

## 2018-03-02 MED ORDER — PIPERACILLIN-TAZOBACTAM 3.375 G IVPB 30 MIN
3.3750 g | Freq: Once | INTRAVENOUS | Status: AC
  Administered 2018-03-02: 3.375 g via INTRAVENOUS
  Filled 2018-03-02: qty 50

## 2018-03-02 MED ORDER — VANCOMYCIN HCL 10 G IV SOLR
1250.0000 mg | Freq: Once | INTRAVENOUS | Status: AC
  Administered 2018-03-02: 1250 mg via INTRAVENOUS
  Filled 2018-03-02: qty 1250

## 2018-03-02 MED ORDER — POTASSIUM CHLORIDE CRYS ER 20 MEQ PO TBCR
40.0000 meq | EXTENDED_RELEASE_TABLET | Freq: Once | ORAL | Status: AC
  Administered 2018-03-02: 23:00:00 40 meq via ORAL
  Filled 2018-03-02: qty 2

## 2018-03-02 MED ORDER — PANTOPRAZOLE SODIUM 40 MG PO TBEC
40.0000 mg | DELAYED_RELEASE_TABLET | ORAL | Status: DC
  Administered 2018-03-02 – 2018-03-05 (×3): 40 mg via ORAL
  Filled 2018-03-02 (×4): qty 1

## 2018-03-02 MED ORDER — MONTELUKAST SODIUM 10 MG PO TABS
10.0000 mg | ORAL_TABLET | Freq: Every day | ORAL | Status: DC
  Administered 2018-03-02 – 2018-03-04 (×3): 10 mg via ORAL
  Filled 2018-03-02 (×3): qty 1

## 2018-03-02 MED ORDER — ALBUTEROL SULFATE (2.5 MG/3ML) 0.083% IN NEBU: 2.5000 mg | INHALATION_SOLUTION | RESPIRATORY_TRACT | Status: DC | PRN

## 2018-03-02 MED ORDER — VANCOMYCIN HCL IN DEXTROSE 750-5 MG/150ML-% IV SOLN
750.0000 mg | INTRAVENOUS | Status: DC
  Filled 2018-03-02: qty 150

## 2018-03-02 MED ORDER — BENZONATATE 100 MG PO CAPS: 200.0000 mg | ORAL_CAPSULE | Freq: Three times a day (TID) | ORAL | Status: DC | PRN

## 2018-03-02 MED ORDER — ACETAMINOPHEN 325 MG PO TABS
650.0000 mg | ORAL_TABLET | Freq: Four times a day (QID) | ORAL | Status: DC | PRN
  Administered 2018-03-03: 21:00:00 650 mg via ORAL
  Filled 2018-03-02 (×2): qty 2

## 2018-03-02 MED ORDER — SODIUM CHLORIDE 0.9 % IV SOLN
2.0000 g | Freq: Two times a day (BID) | INTRAVENOUS | Status: AC
  Administered 2018-03-02 – 2018-03-04 (×5): 2 g via INTRAVENOUS
  Filled 2018-03-02 (×5): qty 2

## 2018-03-02 MED ORDER — PRAVASTATIN SODIUM 20 MG PO TABS
10.0000 mg | ORAL_TABLET | Freq: Every day | ORAL | Status: DC
  Administered 2018-03-02 – 2018-03-04 (×3): 10 mg via ORAL
  Filled 2018-03-02 (×3): qty 1

## 2018-03-02 MED ORDER — SODIUM CHLORIDE 0.9 % IV BOLUS (SEPSIS)
250.0000 mL | Freq: Once | INTRAVENOUS | Status: AC
  Administered 2018-03-02: 250 mL via INTRAVENOUS

## 2018-03-02 NOTE — ED Provider Notes (Addendum)
[facility] Emergency Department Provider Note  ____________________________________________  Time seen: Approximately 6:54 PM  I have reviewed the triage vital signs and the nursing notes.   HISTORY  Chief Complaint Pneumonia    HPI Cassie Jones is a 72 y.o. female with a history of multiple myeloma, last chemo 2 weeks ago, recent treatment for UTI, presenting with 4 days of mostly nonproductive cough, shortness of breath, and decreased exercise tolerance.  The patient denies any congestion or rhinorrhea, sore throat, fevers or chills.  She has had some right ear fullness.  She has felt lightheaded with standing.  She went to her primary care physician's office today and was found to have a multifocal pneumonia on chest x-ray.  Past Medical History:  Diagnosis Date  . Ascites   . Asthma   . Bilateral leg edema   . Cancer ([place])   . Chicken pox   . Colon polyps   . Diverticulitis    with perforation 02/2017 and hosp with colostomy bag s/p removal   . GERD (gastroesophageal reflux disease)   . Hyperlipidemia   . Hypertension   . Multiple myeloma ([place])    dx'ed 01/2018 follows Dr. Tasia Catchings and [facility] H/O   . Pleural effusion   . Thyroid nodule   . UTI (urinary tract infection)   . Vitamin D deficiency     Patient Active Problem List   Diagnosis Date Noted  . Sepsis ([place]) 02/21/2018  . UTI (urinary tract infection) 02/21/2018  . Chronic low back pain 01/29/2018  . Neuropathy 01/28/2018  . Vitamin D deficiency 01/28/2018  . HLD (hyperlipidemia) 01/04/2018  . Tobacco abuse 01/04/2018  . Goals of care, counseling/discussion 01/04/2018  . Multiple myeloma in relapse ([place]) 01/04/2018  . Bone metastasis ([place]) 12/22/2017  . Status post surgery 09/07/2017  . Bilateral lower extremity edema 03/18/2017  . Status post Jeanette Caprice procedure [facility]) 03/18/2017  . Hypokalemia 03/01/2017  . Lower GI bleed 03/01/2017  . Multiple myeloma not having achieved remission  ([place]) 03/01/2017  . Gastroesophageal reflux disease without esophagitis 10/01/2015  . Other seasonal allergic rhinitis 01/25/2015  . Deficiency of other specified B group vitamins 10/26/2014  . Anemia, unspecified 07/21/2014  . Essential hypertension 07/21/2014  . Mid back pain 07/21/2014  . Osteopenia 06/22/2014    Past Surgical History:  Procedure Laterality Date  . ABDOMINAL HYSTERECTOMY    . BREAST BIOPSY Bilateral yrs ago   benign  . CHOLECYSTECTOMY    . COLOSTOMY  2018  . COLOSTOMY REVERSAL     11 2018  . KYPHOPLASTY     11/2017 [facility]     Current Outpatient Rx  . Order #: [id number] Class: Historical Med  . Order #: [id number] Class: Normal  . Order #: [id number] Class: Phone In  . Order #: [id number] Class: Normal  . Order #: [id number] Class: Normal  . Order #: [id number] Class: Normal  . Order #: [id number] Class: Normal  . Order #: [id number] Class: Normal  . Order #: [id number] Class: Historical Med  . Order #: [id number] Class: Historical Med  . Order #: [id number] Class: Historical Med  . Order #: [id number] Class: Historical Med  . Order #: [id number] Class: Historical Med  . Order #: [id number] Class: Historical Med  . Order #: [id number] Class: Historical Med  . Order #: [id number] Class: Normal  . Order #: [id number] Class: Historical Med  . Order #: [id number] Class: Historical Med  . Order #: [id number] Class: Normal    Allergies Codeine  Family History  Problem Relation Age of Onset  . Breast cancer Mother 68  .  Cancer Mother        breast   . Diabetes Mother   . Heart disease Mother   . Hyperlipidemia Mother   . Hypertension Mother   . Birth defects Brother   . Diabetes Brother   . Heart disease Brother   . Hyperlipidemia Brother   . Cancer Father        prostate met to liver     Social History Social History   Tobacco Use  . Smoking status: Former Research scientist (life sciences)  . Smokeless tobacco: Never Used  Substance Use Topics  . Alcohol use: Not Currently  . Drug use:  Never    Review of Systems Constitutional: No fever/chills.  As of lightheadedness with standing.  No syncope. Eyes: No visual changes.  No eye discharge. ENT: No sore throat. No congestion or rhinorrhea.  Right ear fullness. Cardiovascular: Denies chest pain. Denies palpitations. Respiratory: Positive shortness of breath.  Positive nonproductive cough.  Positive decreased exercise tolerance per Gastrointestinal: No abdominal pain.  No nausea, no vomiting.  No diarrhea.  No constipation. Genitourinary: Negative for dysuria. Musculoskeletal: Negative for back pain.  No lower extremity swelling calf pain Skin: Negative for rash. Neurological: Negative for headaches. No focal numbness, tingling or weakness.     ____________________________________________   PHYSICAL EXAM:  VITAL SIGNS: ED Triage Vitals  Enc Vitals Group     BP 03/02/18 1659 114/63     Pulse Rate 03/02/18 1659 88     Resp 03/02/18 1659 20     Temp 03/02/18 1659 (!) 97.4 F (36.3 C)     Temp Source 03/02/18 1659 Oral     SpO2 03/02/18 1659 100 %     Weight 03/02/18 1700 125 lb (56.7 kg)     Height 03/02/18 1700 5' 2" (1.575 m)     Head Circumference --      Peak Flow --      Pain Score 03/02/18 1700 0     Pain Loc --      Pain Edu? --      Excl. in [place]? --     Constitutional: Alert and oriented.  Answers questions appropriately.  Chronically ill-appearing, nontoxic. Eyes: Conjunctivae are normal.  EOMI. No scleral icterus.  No eye discharge EARS: Right ear with mild fullness but otherwise no erythema.  The left ear is without any fullness, bulge or erythema.  The canals are clear bilaterally. Head: Atraumatic. Nose: No congestion/rhinnorhea. Mouth/Throat: Mucous membranes are dry.  Neck: No stridor.  Supple.  No JVD.  No meningismus peer Cardiovascular: Normal rate, regular rhythm. No murmurs, rubs or gallops.  Respiratory: The patient has tachypnea with accessory muscle use and retractions.  She is able  to speak in 3-4 word sentences.  She has rales in the bases and midlung areas bilaterally.  No wheezes or rhonchi peer Gastrointestinal: Soft, nontender and nondistended.  No guarding or rebound.  No peritoneal signs. Musculoskeletal: No LE edema. No ttp in the calves or palpable cords.  Negative Homan's sign. Neurologic:  A&Ox3.  Speech is clear.  Face and smile are symmetric.  EOMI.  Moves all extremities well. Skin:  Skin is warm, dry and intact. No rash noted. Psychiatric: Mood and affect are normal. Speech and behavior are normal.  Normal judgement.  ____________________________________________   LABS (all labs ordered are listed, but only abnormal results are displayed)  Labs Reviewed  COMPREHENSIVE METABOLIC PANEL - Abnormal; Notable for the following components:      Result Value  Sodium 134 (*)    Potassium 3.0 (*)    Chloride 96 (*)    Glucose, Bld 129 (*)    Calcium 8.6 (*)    Albumin 2.9 (*)    Total Bilirubin 1.3 (*)    All other components within normal limits  CBC WITH DIFFERENTIAL/PLATELET - Abnormal; Notable for the following components:   WBC 1.6 (*)    RDW 15.6 (*)    Neutro Abs 0.6 (*)    Lymphs Abs 0.4 (*)    All other components within normal limits  CULTURE, BLOOD (ROUTINE X 2)  CULTURE, BLOOD (ROUTINE X 2)  BRAIN NATRIURETIC PEPTIDE  URINALYSIS, ROUTINE W REFLEX MICROSCOPIC  LACTIC ACID, PLASMA  LACTIC ACID, PLASMA  TROPONIN I  BLOOD GAS, VENOUS   ____________________________________________  EKG  ED ECG REPORT I, Eula Listen, the attending physician, personally viewed and interpreted this ECG.   Date: 03/02/2018  EKG Time: 1706  Rate: 89  Rhythm: normal sinus rhythm  Axis: leftward  Intervals:none  ST&T Change: No STEMI  ----------------------------------------- 8:24 PM on 03/02/2018 -----------------------------------------  Repeat EKG: ED ECG REPORT I, Eula Listen, the attending physician, personally viewed and  interpreted this ECG.   Date: 03/02/2018  EKG Time: 2007  Rate: 76  Rhythm: normal sinus rhythm  Axis: normal  Intervals:none  ST&T Change: No STEMI   ____________________________________________  RADIOLOGY  Dg Chest 2 View  Result Date: 03/02/2018 CLINICAL DATA:  Cough for the past 5 days associated with chest congestion and shortness of breath. EXAM: CHEST - 2 VIEW COMPARISON:  Chest x-ray of Feb 21, 2018 FINDINGS: The lungs are well-expanded. The interstitial markings have become markedly more conspicuous bilaterally. An area of confluent density is noted at the left lung base. The heart and pulmonary vascularity are normal. The mediastinum is normal in width. IMPRESSION: Widespread interstitial infiltrates worrisome for pneumonia much more conspicuous than on the previous study. There is an area of airspace opacity in the lingula that may also reflect pneumonia. No CHF. Followup PA and lateral chest X-ray is recommended in 3-4 weeks following trial of antibiotic therapy to ensure resolution and exclude underlying malignancy. Thoracic aortic atherosclerosis. Electronically Signed   By: David  Martinique M.D.   On: 03/02/2018 16:02    ____________________________________________   PROCEDURES  Procedure(s) performed: None  Procedures  Critical Care performed: Yes ____________________________________________   INITIAL IMPRESSION / ASSESSMENT AND PLAN / ED COURSE  Pertinent labs & imaging results that were available during my care of the patient were reviewed by me and considered in my medical decision making (see chart for details).  72 y.o. female on chemo for multiple myeloma presenting with shortness of breath, lightheadedness with standing, cough, decreased exercise tolerance.  Overall, the patient's clinical picture is most consistent with a multifocal pneumonia which is present on her x-ray.  She has a white blood cell count of 1.6.  She is hypokalemic and that has been  supplemented.  While an infectious cause is most likely, we will make sure there are no other possible causes for shortness of breath.  The BNP and troponin are pending.  At this time, the patient will receive broad-spectrum antibiotics, intravenous fluids, and be admitted for continued evaluation and treatment.  CRITICAL CARE Performed by: Eula Listen   Total critical care time: 35 minutes  Critical care time was exclusive of separately billable procedures and treating other patients.  Critical care was necessary to treat or prevent imminent or life-threatening deterioration.  Critical care  was time spent personally by me on the following activities: development of treatment plan with patient and/or surrogate as well as nursing, discussions with consultants, evaluation of patient's response to treatment, examination of patient, obtaining history from patient or surrogate, ordering and performing treatments and interventions, ordering and review of laboratory studies, ordering and review of radiographic studies, pulse oximetry and re-evaluation of patient's condition.   ____________________________________________  FINAL CLINICAL IMPRESSION(S) / ED DIAGNOSES  Final diagnoses:  Sepsis, due to unspecified organism [facility])  Multifocal pneumonia  Hypokalemia         NEW MEDICATIONS STARTED DURING THIS VISIT:  New Prescriptions   No medications on file       Eula Listen, MD 03/02/18 1919    Eula Listen, MD 03/02/18 2025    Eula Listen, MD 03/02/18 2049

## 2018-03-02 NOTE — ED Triage Notes (Signed)
To ER via POV stating that she had outpatient xray today due to SOB, cough and congestion and she was told to come to ER because she had pneumonia. recently in hospital for UTI

## 2018-03-02 NOTE — Patient Instructions (Addendum)
Premier Protein shake 2x per day any flavor  Stop cephalexin take Levaquin every other day x 5 days  If pneumonia is + on Chest Xray sending to the hospital  Tessalon perles try daily for cough with Robitussin DM or Mucinex DM green label  Take Claritin at night  Flonase 1-2 sprays 1x per day nose  Get some cough drops  Use Flovent inhaler and albuterol inhaler  Hold your spironolactone until you are feeling better  Increase water intake  Please schedule your mammogram when feeling better     Cough, Adult Coughing is a reflex that clears your throat and your airways. Coughing helps to heal and protect your lungs. It is normal to cough occasionally, but a cough that happens with other symptoms or lasts a long time may be a sign of a condition that needs treatment. A cough may last only 2-3 weeks (acute), or it may last longer than 8 weeks (chronic). What are the causes? Coughing is commonly caused by:  Breathing in substances that irritate your lungs.  A viral or bacterial respiratory infection.  Allergies.  Asthma.  Postnasal drip.  Smoking.  Acid backing up from the stomach into the esophagus (gastroesophageal reflux).  Certain medicines.  Chronic lung problems, including COPD (or rarely, lung cancer).  Other medical conditions such as heart failure.  Follow these instructions at home: Pay attention to any changes in your symptoms. Take these actions to help with your discomfort:  Take medicines only as told by your health care provider. ? If you were prescribed an antibiotic medicine, take it as told by your health care provider. Do not stop taking the antibiotic even if you start to feel better. ? Talk with your health care provider before you take a cough suppressant medicine.  Drink enough fluid to keep your urine clear or pale yellow.  If the air is dry, use a cold steam vaporizer or humidifier in your bedroom or your home to help loosen secretions.  Avoid  anything that causes you to cough at work or at home.  If your cough is worse at night, try sleeping in a semi-upright position.  Avoid cigarette smoke. If you smoke, quit smoking. If you need help quitting, ask your health care provider.  Avoid caffeine.  Avoid alcohol.  Rest as needed.  Contact a health care provider if:  You have new symptoms.  You cough up pus.  Your cough does not get better after 2-3 weeks, or your cough gets worse.  You cannot control your cough with suppressant medicines and you are losing sleep.  You develop pain that is getting worse or pain that is not controlled with pain medicines.  You have a fever.  You have unexplained weight loss.  You have night sweats. Get help right away if:  You cough up blood.  You have difficulty breathing.  Your heartbeat is very fast. This information is not intended to replace advice given to you by your health care provider. Make sure you discuss any questions you have with your health care provider. Document Released: 04/04/2011 Document Revised: 03/13/2016 Document Reviewed: 12/13/2014 Elsevier Interactive Patient Education  [employer].

## 2018-03-02 NOTE — H&P (Signed)
[facility] NAME: Cassie Jones    MR#:  [id number]  DATE OF BIRTH:  December 25, 1945  DATE OF ADMISSION:  03/02/2018  PRIMARY CARE PHYSICIAN: McLean-Scocuzza, Nino Glow, MD   REQUESTING/REFERRING PHYSICIAN: Dr. Mariea Clonts  CHIEF COMPLAINT:   Chief Complaint  Patient presents with  . Pneumonia   Cough, shortness of breath with sputum for several days. HISTORY OF PRESENT ILLNESS:  Cassie Jones  is a 72 y.o. female with a known history of multiple myeloma, status post chemotherapy 2 weeks ago, hypertension, hyperlipidemia, pleural effusion, asthma and ascites.  The patient was recently treated with antibiotics for UTI.  She started to have cough, sputum and the shortness of breath a few days ago.  She feels generalized weakness but no fever or chills.  Chest x-ray show multifocal pneumonia.  The patient is found septic and treated with antibiotics in the ED. PAST MEDICAL HISTORY:   Past Medical History:  Diagnosis Date  . Ascites   . Asthma   . Bilateral leg edema   . Cancer ([place])   . Chicken pox   . Colon polyps   . Diverticulitis    with perforation 02/2017 and hosp with colostomy bag s/p removal   . GERD (gastroesophageal reflux disease)   . Hyperlipidemia   . Hypertension   . Multiple myeloma ([place])    dx'ed 01/2018 follows Dr. Tasia Catchings and [facility] H/O   . Pleural effusion   . Thyroid nodule   . UTI (urinary tract infection)   . Vitamin D deficiency     PAST SURGICAL HISTORY:   Past Surgical History:  Procedure Laterality Date  . ABDOMINAL HYSTERECTOMY    . BREAST BIOPSY Bilateral yrs ago   benign  . CHOLECYSTECTOMY    . COLOSTOMY  2018  . COLOSTOMY REVERSAL     11 2018  . KYPHOPLASTY     11/2017 [place]     SOCIAL HISTORY:   Social History   Tobacco Use  . Smoking status: Former Research scientist (life sciences)  . Smokeless tobacco: Never Used  Substance Use Topics  . Alcohol use: Not Currently    FAMILY HISTORY:   Family History    Problem Relation Age of Onset  . Breast cancer Mother 60  . Cancer Mother        breast   . Diabetes Mother   . Heart disease Mother   . Hyperlipidemia Mother   . Hypertension Mother   . Birth defects Brother   . Diabetes Brother   . Heart disease Brother   . Hyperlipidemia Brother   . Cancer Father        prostate met to liver     DRUG ALLERGIES:   Allergies  Allergen Reactions  . Codeine Nausea And Vomiting    REVIEW OF SYSTEMS:   Review of Systems  Constitutional: Positive for malaise/fatigue. Negative for chills and fever.  HENT: Negative for sore throat.   Eyes: Negative for blurred vision and double vision.  Respiratory: Positive for cough, sputum production and shortness of breath. Negative for hemoptysis, wheezing and stridor.   Cardiovascular: Negative for chest pain, palpitations, orthopnea and leg swelling.  Gastrointestinal: Negative for abdominal pain, blood in stool, diarrhea, melena, nausea and vomiting.  Genitourinary: Negative for dysuria, flank pain and hematuria.  Musculoskeletal: Negative for back pain and joint pain.  Neurological: Negative for dizziness, sensory change, focal weakness, seizures, loss of consciousness, weakness and headaches.  Endo/Heme/Allergies: Negative for polydipsia.  Psychiatric/Behavioral: Negative for depression. The patient is not nervous/anxious.     MEDICATIONS AT HOME:   Prior to Admission medications   Medication Sig Start Date End Date Taking? Authorizing Provider  acetaminophen (TYLENOL) 500 MG tablet Take 1,000 mg by mouth every 6 (six) hours as needed for mild pain or fever.   Yes [provider]  albuterol (VENTOLIN HFA) 108 (90 Base) MCG/ACT inhaler Inhale 2 puffs into the lungs every 6 (six) hours as needed. 01/15/18  Yes Verlon Au, NP  apixaban (ELIQUIS) 5 MG TABS tablet Take 2 tablets (66m) twice a day for 7 days. Take 1 tablet (523m twice daily after first week. 02/16/18  Yes YuEarlie ServerMD   benzonatate (TESSALON) 200 MG capsule Take 1 capsule (200 mg total) by mouth 3 (three) times daily as needed for cough. 03/02/18  Yes McLean-Scocuzza, TrNino GlowMD  fluticasone (FLOVENT DISKUS) 50 MCG/BLIST diskus inhaler Inhale 1 puff into the lungs 2 (two) times daily. 01/22/18  Yes YuEarlie ServerMD  furosemide (LASIX) 20 MG tablet Take 1 tablet (20 mg total) by mouth daily as needed. Patient taking differently: Take 20 mg by mouth daily as needed for edema.  01/28/18  Yes McLean-Scocuzza, TrNino GlowMD  loperamide (IMODIUM A-D) 2 MG tablet Take 2 mg by mouth 4 (four) times daily as needed for diarrhea or loose stools.   Yes [provider]  montelukast (SINGULAIR) 10 MG tablet Take 10 mg by mouth at bedtime.   Yes [provider]  ondansetron (ZOFRAN) 8 MG tablet Take 8 mg by mouth every 8 (eight) hours as needed for nausea.   Yes [provider]  ondansetron (ZOFRAN-ODT) 4 MG disintegrating tablet Take 4 mg by mouth every 12 (twelve) hours as needed for nausea.   Yes [provider]  oxyCODONE (OXY IR/ROXICODONE) 5 MG immediate release tablet Take 5 mg by mouth 3 (three) times daily as needed for moderate pain.   Yes [provider]  pantoprazole (PROTONIX) 40 MG tablet Take 1 tablet by mouth every morning. 01/22/16  Yes [provider]  POMALYST 4 MG capsule Take 1 capsule by mouth with water every day on days 1-21 every 28 days. Patient taking differently: Take 4 mg by mouth as directed. Take 1 capsule by mouth at 8pm with water every day on days 1-21 every 28 days. 01/28/18  Yes YuEarlie ServerMD  pravastatin (PRAVACHOL) 10 MG tablet Take 1 tablet by mouth at bedtime. 12/11/16  Yes [provider]  prochlorperazine (COMPAZINE) 10 MG tablet Take 10 mg by mouth every 6 (six) hours as needed for nausea.   Yes [provider]  fluticasone (FLONASE) 50 MCG/ACT nasal spray Place 1-2 sprays into both nostrils daily. 03/02/18   McLean-Scocuzza, TrNino GlowMD  levofloxacin (LEVAQUIN) 750 MG tablet Take 1 tablet (750 mg total) by mouth every other day. 03/02/18   McLean-Scocuzza, TrNino GlowMD  oxyCODONE (OXYCONTIN) 10 mg 12 hr tablet Take 10 mg by mouth every 12 (twelve) hours as needed (severe pain).    [provider]  spironolactone (ALDACTONE) 25 MG tablet Take 1 tablet (25 mg total) by mouth daily. Patient not taking: Reported on 03/02/2018 01/28/18   McLean-Scocuzza, TrNino GlowMD      VITAL SIGNS:  Blood pressure 120/77, pulse 78, temperature (!) 97.4 F (36.3 C), temperature source Oral, resp. rate (!) 27, height _0  (1.575 m), weight 125 lb (56.7 kg), SpO2 97 %.  PHYSICAL EXAMINATION:  Physical Exam  GENERAL:  72 y.o.-year-old patient lying in the bed with no acute distress.  EYES: Pupils equal, round, reactive to light and accommodation. No scleral icterus. Extraocular muscles intact.  HEENT: Head atraumatic, normocephalic. Oropharynx and nasopharynx clear.  NECK:  Supple, no jugular venous distention. No thyroid enlargement, no tenderness.  LUNGS: Normal breath sounds bilaterally, no wheezing, rales,rhonchi or crepitation. No use of accessory muscles of respiration.  CARDIOVASCULAR: S1, S2 normal. No murmurs, rubs, or gallops.  ABDOMEN: Soft, nontender, nondistended. Bowel sounds present. No organomegaly or mass.  EXTREMITIES: No pedal edema, cyanosis, or clubbing.  NEUROLOGIC: Cranial nerves II through XII are intact. Muscle strength 5/5 in all extremities. Sensation intact. Gait not checked.  PSYCHIATRIC: The patient is alert and oriented x 3.   SKIN: No obvious rash, lesion, or ulcer.   LABORATORY PANEL:   CBC Recent Labs  Lab 03/02/18 1715  WBC 1.6*  HGB 12.2  HCT 35.5  PLT 155   ------------------------------------------------------------------------------------------------------------------  Chemistries  Recent Labs  Lab 03/02/18 1033 03/02/18 1715  NA 138 134*  K 3.0* 3.0*  CL 97 96*  CO2 29 27    GLUCOSE 119* 129*  BUN 12 14  CREATININE 0.83 0.83  CALCIUM 9.0 8.6*  MG 2.1  --   AST  --  20  ALT  --  16  ALKPHOS  --  55  BILITOT  --  1.3*   ------------------------------------------------------------------------------------------------------------------  Cardiac Enzymes Recent Labs  Lab 03/02/18 1715  TROPONINI <0.03   ------------------------------------------------------------------------------------------------------------------  RADIOLOGY:  Dg Chest 2 View  Result Date: 03/02/2018 CLINICAL DATA:  Cough for the past 5 days associated with chest congestion and shortness of breath. EXAM: CHEST - 2 VIEW COMPARISON:  Chest x-ray of Feb 21, 2018 FINDINGS: The lungs are well-expanded. The interstitial markings have become markedly more conspicuous bilaterally. An area of confluent density is noted at the left lung base. The heart and pulmonary vascularity are normal. The mediastinum is normal in width. IMPRESSION: Widespread interstitial infiltrates worrisome for pneumonia much more conspicuous than on the previous study. There is an area of airspace opacity in the lingula that may also reflect pneumonia. No CHF. Followup PA and lateral chest X-ray is recommended in 3-4 weeks following trial of antibiotic therapy to ensure resolution and exclude underlying malignancy. Thoracic aortic atherosclerosis. Electronically Signed   By: David  Martinique M.D.   On: 03/02/2018 16:02      IMPRESSION AND PLAN:   Sepsis due to multifocal pneumonia, HAP. Patient will be admitted to medical floor. Continue cefepime and vancomycin, follow-up CBC and cultures.  Robitussin as needed.  Neutropenia due to chemotherapy. Follow-up with CBC and oncology consult.  Neutropenic isolation.  Hypokalemia.  Give potassium supplement, follow-up potassium and magnesium level.  History of multiple myeloma, status chemotherapy.  Follow-up oncologist.  All the records are reviewed and case discussed with ED  provider. Management plans discussed with the patient, family and they are in agreement.  CODE STATUS: Full code  TOTAL TIME TAKING CARE OF THIS PATIENT: 53 minutes.    Demetrios Loll M.D on 03/02/2018 at 8:49 PM  Between 7am to 6pm - Pager - [phone]  After 6pm go to [email or web address] - Proofreader  [facility]  Office  [phone]  CC: Primary care physician; McLean-Scocuzza, Nino Glow, MD   Note: This dictation was prepared with Dragon dictation along with smaller phrase technology. Any transcriptional errors that result from this process are unin

## 2018-03-02 NOTE — Progress Notes (Signed)
Advanced Care Plan.  Purpose of Encounter: CODE STATUS. Parties in Attendance: The patient, her closed friend and me. Patient's Decisional Capacity: Yes. Medical Story: Cassie Jones  is a 72 y.o. female with a known history of multiple myeloma, status post chemotherapy 2 weeks ago, hypertension, hyperlipidemia, pleural effusion, asthma and ascites.  The patient did present to the ED with cough, shortness of breath for several days.  Chest x-ray show multifocal pneumonia.  She was found septic and treated with antibiotics in ED.  She is being admitted to the hospital due to sepsis with pneumonia.  Discussed the patient current condition, prognosis and CODE STATUS.  The patient want full code. Plan:  Code Status: Full code Time spent discussing advance care planning: 18 minutes.

## 2018-03-02 NOTE — Progress Notes (Signed)
Pharmacy Antibiotic Note  Cassie Jones is a 72 y.o. female admitted on 03/02/2018 with PNA.  Pharmacy has been consulted for cefepime and vancomycin dosing.  Plan: 1. Cefepime 2 gm IV Q12H 2. Vancomycin 1250 mg IV x 1 in ED followed in approximately 20 hours (stacked dosing) by vancomycin 750 mg IV Q18H, predicted trough 17 mcg/mL. Pharmacy will continue to follow and adjust as needed to maintain trough 15 to 20 mcg/mL.   Vd 34.7 L, Ke 0.045 hr-1, T1/2 15.3 hr  Height: 5\' 2"  (157.5 cm) Weight: 125 lb (56.7 kg) IBW/kg (Calculated) : 50.1  Temp (24hrs), Avg:97.6 F (36.4 C), Min:97.4 F (36.3 C), Max:97.7 F (36.5 C)  Recent Labs  Lab 02/25/18 0802 03/02/18 1033 03/02/18 1715  WBC  --  2.2 Repeated and verified X2.* 1.6*  CREATININE 0.67 0.83 0.83    Estimated Creatinine Clearance: 49.9 mL/min (by C-G formula based on SCr of 0.83 mg/dL).    Allergies  Allergen Reactions  . Codeine Nausea And Vomiting    Antimicrobials this admission: Vanc/Zosyn x 1 in ED  Dose adjustments this admission:   Microbiology results:  BCx:   UCx:    Sputum:   5/14 MRSA PCR: pending  Thank you for allowing pharmacy to be a part of this patient's care.  Laural Benes, Pharm.D., BCPS Clinical Pharmacist 03/02/2018 7:58 PM

## 2018-03-02 NOTE — Progress Notes (Signed)
Pre visit review using our clinic review tool, if applicable. No additional management support is needed unless otherwise documented below in the visit note. 

## 2018-03-02 NOTE — Progress Notes (Signed)
CODE SEPSIS - PHARMACY COMMUNICATION  **Broad Spectrum Antibiotics should be administered within 1 hour of Sepsis diagnosis**  Time Code Sepsis Called/Page Received: 19:12  Antibiotics Ordered: Zosyn/Vanc  Time of 1st antibiotic administration: 19:35  Additional action taken by pharmacy:   If necessary, Name of Provider/Nurse Contacted:     Laural Benes, Pharm.D., BCPS Clinical Pharmacist  03/02/2018  7:23 PM

## 2018-03-02 NOTE — ED Notes (Addendum)
Patient ambulated around room, complaining of weakness and feeling "unsteady". Patient oxygen saturation at 100%, however patient became increasingly tachypnic and tachycardic with visible increase of work of breathing. MD aware.

## 2018-03-02 NOTE — Progress Notes (Signed)
Chief Complaint  Patient presents with  . Follow-up   HFU for sepsis 2/2 UTI Klebsiella in [facility] 5/5-5/9 still feeling fatigue and not well c/o cough and congestion nothing tired. She started feeling bad 02/25/18. She is on keflex 500 bid and has 1 more day UTI sx's improved   Noted RUE DVT 02/16/18 on Eliquis and f/u H/O for MM  Review of Systems  Constitutional: Positive for weight loss. Negative for fever.  HENT: Negative for hearing loss.   Eyes: Negative for blurred vision.  Respiratory: Positive for cough and shortness of breath. Negative for sputum production.   Cardiovascular: Negative for chest pain.  Musculoskeletal: Negative for back pain and falls.  Skin: Negative for rash.  Neurological: Negative for headaches.  Psychiatric/Behavioral: Positive for depression.   Past Medical History:  Diagnosis Date  . Ascites   . Asthma   . Bilateral leg edema   . Cancer ([place])   . Chicken pox   . Colon polyps   . Diverticulitis    with perforation 02/2017 and hosp with colostomy bag s/p removal   . GERD (gastroesophageal reflux disease)   . Hyperlipidemia   . Hypertension   . Multiple myeloma ([place])    dx'ed 01/2018 follows Dr. Tasia Catchings and [facility] H/O   . Pleural effusion   . Thyroid nodule   . UTI (urinary tract infection)   . Vitamin D deficiency    Past Surgical History:  Procedure Laterality Date  . ABDOMINAL HYSTERECTOMY    . BREAST BIOPSY Bilateral yrs ago   benign  . CHOLECYSTECTOMY    . COLOSTOMY  2018  . COLOSTOMY REVERSAL     11 2018  . KYPHOPLASTY     11/2017 [place]    Family History  Problem Relation Age of Onset  . Breast cancer Mother 44  . Cancer Mother        breast   . Diabetes Mother   . Heart disease Mother   . Hyperlipidemia Mother   . Hypertension Mother   . Birth defects Brother   . Diabetes Brother   . Heart disease Brother   . Hyperlipidemia Brother   . Cancer Father        prostate met to liver    Social History   Socioeconomic History   . Marital status: Married    Spouse name: Not on file  . Number of children: Not on file  . Years of education: Not on file  . Highest education level: Not on file  Occupational History  . Not on file  Social Needs  . Financial resource strain: Not hard at all  . Food insecurity:    Worry: Never true    Inability: Never true  . Transportation needs:    Medical: No    Non-medical: No  Tobacco Use  . Smoking status: Former Research scientist (life sciences)  . Smokeless tobacco: Never Used  Substance and Sexual Activity  . Alcohol use: Not Currently  . Drug use: Never  . Sexual activity: Yes    Comment: husband   Lifestyle  . Physical activity:    Days per week: 0 days    Minutes per session: 0 min  . Stress: Not at all  Relationships  . Social connections:    Talks on phone: More than three times a week    Gets together: Once a week    Attends religious service: 1 to 4 times per year    Active member of club or organization: No  Attends meetings of clubs or organizations: Never    Relationship status: Married  . Intimate partner violence:    Fear of current or ex partner: No    Emotionally abused: No    Physically abused: No    Forced sexual activity: No  Other Topics Concern  . Not on file  Social History Narrative   Married    Husband lives in [place] while she gets treatment in this area    She lives alone in this area with family close by    No outpatient medications have been marked as taking for the 03/02/18 encounter (Office Visit) with McLean-Scocuzza, Nino Glow, MD.   Allergies  Allergen Reactions  . Codeine Nausea And Vomiting   Recent Results (from the past 2160 hour(s))  Multiple Myeloma Panel (SPEP&IFE w/QIG)     Status: Abnormal   Collection Time: 01/04/18 12:37 PM  Result Value Ref Range   IgG (Immunoglobin G), Serum 2,397 (H) 700 - 1,600 mg/dL   IgA 15 (L) 87 - 352 mg/dL    Comment: Result confirmed on concentration.   IgM (Immunoglobulin M), Srm 27 26 - 217 mg/dL    Total Protein ELP 7.2 6.0 - 8.5 g/dL   Albumin SerPl Elph-Mcnc 3.2 2.9 - 4.4 g/dL   Alpha 1 0.3 0.0 - 0.4 g/dL   Alpha2 Glob SerPl Elph-Mcnc 1.1 (H) 0.4 - 1.0 g/dL   B-Globulin SerPl Elph-Mcnc 0.9 0.7 - 1.3 g/dL   Gamma Glob SerPl Elph-Mcnc 1.8 0.4 - 1.8 g/dL   M Protein SerPl Elph-Mcnc 1.5 (H) Not Observed g/dL   Globulin, Total 4.0 (H) 2.2 - 3.9 g/dL   Albumin/Glob SerPl 0.9 0.7 - 1.7   IFE 1 Comment     Comment: (NOTE) Immunofixation shows IgG monoclonal protein with lambda light chain specificity.    Please Note Comment     Comment: (NOTE) Protein electrophoresis scan will follow via computer, mail, or courier delivery. Performed At: [facility] [place], [place] [id number] Rush Farmer MD UK:[id number] Performed at [facility], [address] [place], [place] [address]   Comprehensive metabolic panel     Status: Abnormal   Collection Time: 01/04/18  1:08 PM  Result Value Ref Range   Sodium 136 135 - 145 mmol/L   Potassium 3.9 3.5 - 5.1 mmol/L   Chloride 105 101 - 111 mmol/L   CO2 24 22 - 32 mmol/L   Glucose, Bld 138 (H) 65 - 99 mg/dL   BUN 13 6 - 20 mg/dL   Creatinine, Ser 0.64 0.44 - 1.00 mg/dL   Calcium 8.4 (L) 8.9 - 10.3 mg/dL   Total Protein 7.9 6.5 - 8.1 g/dL   Albumin 3.4 (L) 3.5 - 5.0 g/dL   AST 19 15 - 41 U/L   ALT 17 14 - 54 U/L   Alkaline Phosphatase 85 38 - 126 U/L   Total Bilirubin 1.2 0.3 - 1.2 mg/dL   GFR calc non Af Amer >60 >60 mL/min   GFR calc Af Amer >60 >60 mL/min    Comment: (NOTE) The eGFR has been calculated using the CKD EPI equation. This calculation has not been validated in all clinical situations. eGFR's persistently <60 mL/min signify possible Chronic Kidney Disease.    Anion gap 7 5 - 15    Comment: Performed at [facility], [address], [place], [place] [address]  CBC with Differential/Platelet     Status: None   Collection Time: 01/04/18  1:08  PM  Result Value Ref Range   WBC 5.9  3.6 - 11.0 K/uL   RBC 4.39 3.80 - 5.20 MIL/uL   Hemoglobin 13.7 12.0 - 16.0 g/dL   HCT 40.5 35.0 - 47.0 %   MCV 92.1 80.0 - 100.0 fL   MCH 31.2 26.0 - 34.0 pg   MCHC 33.8 32.0 - 36.0 g/dL   RDW 14.5 11.5 - 14.5 %   Platelets 166 150 - 440 K/uL   Neutrophils Relative % 69 %   Neutro Abs 4.1 1.4 - 6.5 K/uL   Lymphocytes Relative 18 %   Lymphs Abs 1.0 1.0 - 3.6 K/uL   Monocytes Relative 12 %   Monocytes Absolute 0.7 0.2 - 0.9 K/uL   Eosinophils Relative 1 %   Eosinophils Absolute 0.0 0 - 0.7 K/uL   Basophils Relative 0 %   Basophils Absolute 0.0 0 - 0.1 K/uL    Comment: Performed at [facility], [address] [place], [place] [address]  Type and screen     Status: None   Collection Time: 01/08/18  8:53 AM  Result Value Ref Range   ABO/RH(D) B POS    Antibody Screen POS    Sample Expiration 01/11/2018   Comprehensive metabolic panel     Status: Abnormal   Collection Time: 01/08/18  9:02 AM  Result Value Ref Range   Sodium 136 135 - 145 mmol/L   Potassium 3.9 3.5 - 5.1 mmol/L   Chloride 105 101 - 111 mmol/L   CO2 24 22 - 32 mmol/L   Glucose, Bld 107 (H) 65 - 99 mg/dL   BUN 13 6 - 20 mg/dL   Creatinine, Ser 0.71 0.44 - 1.00 mg/dL   Calcium 8.7 (L) 8.9 - 10.3 mg/dL   Total Protein 7.3 6.5 - 8.1 g/dL   Albumin 3.2 (L) 3.5 - 5.0 g/dL   AST 15 15 - 41 U/L   ALT 15 14 - 54 U/L   Alkaline Phosphatase 73 38 - 126 U/L   Total Bilirubin 1.1 0.3 - 1.2 mg/dL   GFR calc non Af Amer >60 >60 mL/min   GFR calc Af Amer >60 >60 mL/min    Comment: (NOTE) The eGFR has been calculated using the CKD EPI equation. This calculation has not been validated in all clinical situations. eGFR's persistently <60 mL/min signify possible Chronic Kidney Disease.    Anion gap 7 5 - 15    Comment: Performed at [facility], [address] [place], [place] [address]  CBC with Differential     Status: Abnormal   Collection Time: 01/08/18  9:02 AM  Result Value Ref Range   WBC 5.4 3.6 -  11.0 K/uL   RBC 4.01 3.80 - 5.20 MIL/uL   Hemoglobin 12.6 12.0 - 16.0 g/dL   HCT 36.8 35.0 - 47.0 %   MCV 91.7 80.0 - 100.0 fL   MCH 31.3 26.0 - 34.0 pg   MCHC 34.2 32.0 - 36.0 g/dL   RDW 14.5 11.5 - 14.5 %   Platelets 146 (L) 150 - 440 K/uL   Neutrophils Relative % 72 %   Neutro Abs 3.8 1.4 - 6.5 K/uL   Lymphocytes Relative 13 %   Lymphs Abs 0.7 (L) 1.0 - 3.6 K/uL   Monocytes Relative 14 %   Monocytes Absolute 0.8 0.2 - 0.9 K/uL   Eosinophils Relative 1 %   Eosinophils Absolute 0.1 0 - 0.7 K/uL   Basophils Relative 0 %  Basophils Absolute 0.0 0 - 0.1 K/uL    Comment: Performed at [facility], [address] [place], [place] [address]  CBC with Differential/Platelet     Status: Abnormal   Collection Time: 01/14/18 11:15 AM  Result Value Ref Range   WBC 2.9 (L) 3.6 - 11.0 K/uL   RBC 4.01 3.80 - 5.20 MIL/uL   Hemoglobin 12.5 12.0 - 16.0 g/dL   HCT 36.4 35.0 - 47.0 %   MCV 90.8 80.0 - 100.0 fL   MCH 31.2 26.0 - 34.0 pg   MCHC 34.4 32.0 - 36.0 g/dL   RDW 14.4 11.5 - 14.5 %   Platelets 129 (L) 150 - 440 K/uL   Neutrophils Relative % 75 %   Neutro Abs 2.2 1.4 - 6.5 K/uL   Lymphocytes Relative 12 %   Lymphs Abs 0.3 (L) 1.0 - 3.6 K/uL   Monocytes Relative 11 %   Monocytes Absolute 0.3 0.2 - 0.9 K/uL   Eosinophils Relative 2 %   Eosinophils Absolute 0.1 0 - 0.7 K/uL   Basophils Relative 0 %   Basophils Absolute 0.0 0 - 0.1 K/uL    Comment: Performed at [facility], [place], [place] [address]  Fibrin derivatives D-Dimer [facility])     Status: Abnormal   Collection Time: 01/14/18 11:15 AM  Result Value Ref Range   Fibrin derivatives D-dimer ([facility]) 3,701.30 (H) 0.00 - 499.00 ng/mL (FEU)    Comment: (NOTE) <> Exclusion of Venous Thromboembolism (VTE) - OUTPATIENT ONLY   (Emergency Department or [place])   0-499 ng/ml (FEU): With a low to intermediate pretest probability                      for VTE this test result excludes the diagnosis                       of VTE.   >499 ng/ml (FEU) : VTE not excluded; additional work up for VTE is                      required. <> Testing on Inpatients and Evaluation of Disseminated Intravascular   Coagulation (DIC) Reference Range:   0-499 ng/ml (FEU) Performed at [facility], [address] [place], [place] [address]   Culture, blood (routine x 2)     Status: None   Collection Time: 01/14/18 11:15 AM  Result Value Ref Range   Specimen Description      BLOOD Performed at [facility], [address] [place], [place] [address]    Special Requests      NONE Performed at [facility], [address] [place], [place] [address]    Culture      NO GROWTH 5 DAYS Performed at [facility], [address], [place], [place] [address]    Report Status 01/19/2018 FINAL   Comprehensive metabolic panel     Status: Abnormal   Collection Time: 01/14/18 11:15 AM  Result Value Ref Range   Sodium 135 135 - 145 mmol/L   Potassium 3.7 3.5 - 5.1 mmol/L   Chloride 103 101 - 111 mmol/L   CO2 24 22 - 32 mmol/L   Glucose, Bld 105 (H) 65 - 99 mg/dL   BUN 13 6 - 20 mg/dL   Creatinine, Ser 0.59 0.44 - 1.00 mg/dL   Calcium 8.7 (L) 8.9 - 10.3 mg/dL   Total Protein 7.1  6.5 - 8.1 g/dL   Albumin 3.1 (L) 3.5 - 5.0 g/dL   AST 16 15 - 41 U/L   ALT 17 14 - 54 U/L   Alkaline Phosphatase 97 38 - 126 U/L   Total Bilirubin 1.2 0.3 - 1.2 mg/dL   GFR calc non Af Amer >60 >60 mL/min   GFR calc Af Amer >60 >60 mL/min    Comment: (NOTE) The eGFR has been calculated using the CKD EPI equation. This calculation has not been validated in all clinical situations. eGFR's persistently <60 mL/min signify possible Chronic Kidney Disease.    Anion gap 8 5 - 15    Comment: Performed at [facility], [address] [place], [place] [address]  Culture, blood (routine x 2)     Status: None   Collection Time: 01/14/18 11:16 AM  Result Value Ref Range   Specimen Description       BLOOD Performed at [facility], [address] [place], [place] [address]    Special Requests      NONE Performed at [facility], [address] [place], [place] [address]    Culture      NO GROWTH 5 DAYS Performed at [facility], [address] [place], [place] [address]    Report Status 01/19/2018 FINAL   Comprehensive metabolic panel     Status: Abnormal   Collection Time: 01/22/18  8:21 AM  Result Value Ref Range   Sodium 137 135 - 145 mmol/L   Potassium 4.3 3.5 - 5.1 mmol/L   Chloride 106 101 - 111 mmol/L   CO2 24 22 - 32 mmol/L   Glucose, Bld 103 (H) 65 - 99 mg/dL   BUN 11 6 - 20 mg/dL   Creatinine, Ser 0.69 0.44 - 1.00 mg/dL   Calcium 8.7 (L) 8.9 - 10.3 mg/dL   Total Protein 6.8 6.5 - 8.1 g/dL   Albumin 3.2 (L) 3.5 - 5.0 g/dL   AST 15 15 - 41 U/L   ALT 13 (L) 14 - 54 U/L   Alkaline Phosphatase 95 38 - 126 U/L   Total Bilirubin 1.0 0.3 - 1.2 mg/dL   GFR calc non Af Amer >60 >60 mL/min   GFR calc Af Amer >60 >60 mL/min    Comment: (NOTE) The eGFR has been calculated using the CKD EPI equation. This calculation has not been validated in all clinical situations. eGFR's persistently <60 mL/min signify possible Chronic Kidney Disease.    Anion gap 7 5 - 15    Comment: Performed at [facility], [place], [place] [address]  CBC with Differential     Status: Abnormal   Collection Time: 01/22/18  8:21 AM  Result Value Ref Range   WBC 2.4 (L) 3.6 - 11.0 K/uL   RBC 4.03 3.80 - 5.20 MIL/uL   Hemoglobin 12.5 12.0 - 16.0 g/dL   HCT 36.1 35.0 - 47.0 %   MCV 89.6 80.0 - 100.0 fL   MCH 30.9 26.0 - 34.0 pg   MCHC 34.5 32.0 - 36.0 g/dL   RDW 14.2 11.5 - 14.5 %   Platelets 115 (L) 150 - 440 K/uL   Neutrophils Relative % 42 %   Neutro Abs 1.0 (L) 1.4 - 6.5 K/uL   Lymphocytes Relative 27 %   Lymphs Abs 0.6 (L) 1.0 - 3.6 K/uL   Monocytes Relative 28 %   Monocytes Absolute 0.7 0.2 - 0.9 K/uL   Eosinophils Relative 2 %  Eosinophils Absolute 0.0 0 - 0.7 K/uL   Basophils Relative 1 %   Basophils Absolute 0.0 0 - 0.1 K/uL    Comment: Performed at [facility], [address] [place], [place] [address]  Comprehensive metabolic panel     Status: Abnormal   Collection Time: 01/29/18 10:56 AM  Result Value Ref Range   Sodium 137 135 - 145 mmol/L   Potassium 3.8 3.5 - 5.1 mmol/L   Chloride 107 101 - 111 mmol/L   CO2 23 22 - 32 mmol/L   Glucose, Bld 97 65 - 99 mg/dL   BUN 11 6 - 20 mg/dL   Creatinine, Ser 0.52 0.44 - 1.00 mg/dL   Calcium 8.4 (L) 8.9 - 10.3 mg/dL   Total Protein 6.3 (L) 6.5 - 8.1 g/dL   Albumin 3.1 (L) 3.5 - 5.0 g/dL   AST 14 (L) 15 - 41 U/L   ALT 13 (L) 14 - 54 U/L   Alkaline Phosphatase 78 38 - 126 U/L   Total Bilirubin 1.2 0.3 - 1.2 mg/dL   GFR calc non Af Amer >60 >60 mL/min   GFR calc Af Amer >60 >60 mL/min    Comment: (NOTE) The eGFR has been calculated using the CKD EPI equation. This calculation has not been validated in all clinical situations. eGFR's persistently <60 mL/min signify possible Chronic Kidney Disease.    Anion gap 7 5 - 15    Comment: Performed at [facility], [address] [place], [place] [address]  CBC with Differential     Status: Abnormal   Collection Time: 01/29/18 10:56 AM  Result Value Ref Range   WBC 2.2 (L) 3.6 - 11.0 K/uL   RBC 4.12 3.80 - 5.20 MIL/uL   Hemoglobin 12.5 12.0 - 16.0 g/dL   HCT 36.6 35.0 - 47.0 %   MCV 88.8 80.0 - 100.0 fL   MCH 30.4 26.0 - 34.0 pg   MCHC 34.3 32.0 - 36.0 g/dL   RDW 14.8 (H) 11.5 - 14.5 %   Platelets 156 150 - 440 K/uL   Neutrophils Relative % 30 %   Neutro Abs 0.7 (L) 1.4 - 6.5 K/uL   Lymphocytes Relative 29 %   Lymphs Abs 0.6 (L) 1.0 - 3.6 K/uL   Monocytes Relative 38 %   Monocytes Absolute 0.9 0.2 - 0.9 K/uL   Eosinophils Relative 2 %   Eosinophils Absolute 0.0 0 - 0.7 K/uL   Basophils Relative 1 %   Basophils Absolute 0.0 0 - 0.1 K/uL    Comment: Performed at [facility], [address] [place], [place] [address]  Comprehensive metabolic panel     Status: Abnormal   Collection Time: 02/05/18  8:26 AM  Result Value Ref Range   Sodium 137 135 - 145 mmol/L   Potassium 3.4 (L) 3.5 - 5.1 mmol/L   Chloride 105 101 - 111 mmol/L   CO2 24 22 - 32 mmol/L   Glucose, Bld 98 65 - 99 mg/dL   BUN 10 6 - 20 mg/dL   Creatinine, Ser 0.66 0.44 - 1.00 mg/dL   Calcium 8.7 (L) 8.9 - 10.3 mg/dL   Total Protein 6.3 (L) 6.5 - 8.1 g/dL   Albumin 3.1 (L) 3.5 - 5.0 g/dL   AST 19 15 - 41 U/L   ALT 15 14 - 54 U/L   Alkaline Phosphatase 68 38 - 126 U/L   Total Bilirubin 1.0 0.3 - 1.2 mg/dL   GFR calc non  Af Amer >60 >60 mL/min   GFR calc Af Amer >60 >60 mL/min    Comment: (NOTE) The eGFR has been calculated using the CKD EPI equation. This calculation has not been validated in all clinical situations. eGFR's persistently <60 mL/min signify possible Chronic Kidney Disease.    Anion gap 8 5 - 15    Comment: Performed at [facility], [address] [place], [place] [address]  CBC with Differential     Status: Abnormal   Collection Time: 02/05/18  8:26 AM  Result Value Ref Range   WBC 2.7 (L) 3.6 - 11.0 K/uL   RBC 4.28 3.80 - 5.20 MIL/uL   Hemoglobin 13.0 12.0 - 16.0 g/dL   HCT 38.1 35.0 - 47.0 %   MCV 89.1 80.0 - 100.0 fL   MCH 30.3 26.0 - 34.0 pg   MCHC 34.0 32.0 - 36.0 g/dL   RDW 14.9 (H) 11.5 - 14.5 %   Platelets 195 150 - 440 K/uL   Neutrophils Relative % 29 %   Neutro Abs 0.8 (L) 1.4 - 6.5 K/uL   Lymphocytes Relative 22 %   Lymphs Abs 0.6 (L) 1.0 - 3.6 K/uL   Monocytes Relative 46 %   Monocytes Absolute 1.3 (H) 0.2 - 0.9 K/uL   Eosinophils Relative 1 %   Eosinophils Absolute 0.0 0 - 0.7 K/uL   Basophils Relative 2 %   Basophils Absolute 0.1 0 - 0.1 K/uL    Comment: Performed at [facility], [address] [place], [place] [address]  Comprehensive metabolic panel     Status: Abnormal   Collection Time: 02/16/18  8:10 AM  Result Value Ref Range    Sodium 138 135 - 145 mmol/L   Potassium 3.7 3.5 - 5.1 mmol/L   Chloride 101 101 - 111 mmol/L   CO2 27 22 - 32 mmol/L   Glucose, Bld 109 (H) 65 - 99 mg/dL   BUN 14 6 - 20 mg/dL   Creatinine, Ser 0.71 0.44 - 1.00 mg/dL   Calcium 9.2 8.9 - 10.3 mg/dL   Total Protein 6.5 6.5 - 8.1 g/dL   Albumin 3.3 (L) 3.5 - 5.0 g/dL   AST 19 15 - 41 U/L   ALT 15 14 - 54 U/L   Alkaline Phosphatase 75 38 - 126 U/L   Total Bilirubin 1.6 (H) 0.3 - 1.2 mg/dL   GFR calc non Af Amer >60 >60 mL/min   GFR calc Af Amer >60 >60 mL/min    Comment: (NOTE) The eGFR has been calculated using the CKD EPI equation. This calculation has not been validated in all clinical situations. eGFR's persistently <60 mL/min signify possible Chronic Kidney Disease.    Anion gap 10 5 - 15    Comment: Performed at [facility], [place], [place] [address]  CBC with Differential     Status: Abnormal   Collection Time: 02/16/18  8:10 AM  Result Value Ref Range   WBC 6.3 3.6 - 11.0 K/uL   RBC 4.15 3.80 - 5.20 MIL/uL   Hemoglobin 12.8 12.0 - 16.0 g/dL   HCT 37.2 35.0 - 47.0 %   MCV 89.7 80.0 - 100.0 fL   MCH 30.9 26.0 - 34.0 pg   MCHC 34.5 32.0 - 36.0 g/dL   RDW 15.8 (H) 11.5 - 14.5 %   Platelets 202 150 - 440 K/uL   Neutrophils Relative % 66 %   Neutro Abs 4.1 1.4 - 6.5 K/uL  Lymphocytes Relative 15 %   Lymphs Abs 0.9 (L) 1.0 - 3.6 K/uL   Monocytes Relative 15 %   Monocytes Absolute 1.0 (H) 0.2 - 0.9 K/uL   Eosinophils Relative 3 %   Eosinophils Absolute 0.2 0 - 0.7 K/uL   Basophils Relative 1 %   Basophils Absolute 0.1 0 - 0.1 K/uL    Comment: Performed at [facility], [address] [place], [place] [address]  Lactic acid, plasma     Status: None   Collection Time: 02/21/18  8:06 PM  Result Value Ref Range   Lactic Acid, Venous 1.3 0.5 - 1.9 mmol/L    Comment: Performed at [facility], [address] [place], [place] [address]  Comprehensive metabolic panel     Status:  Abnormal   Collection Time: 02/21/18  8:06 PM  Result Value Ref Range   Sodium 136 135 - 145 mmol/L   Potassium 3.2 (L) 3.5 - 5.1 mmol/L   Chloride 103 101 - 111 mmol/L   CO2 25 22 - 32 mmol/L   Glucose, Bld 117 (H) 65 - 99 mg/dL   BUN 10 6 - 20 mg/dL   Creatinine, Ser 0.71 0.44 - 1.00 mg/dL   Calcium 8.9 8.9 - 10.3 mg/dL   Total Protein 6.6 6.5 - 8.1 g/dL   Albumin 3.1 (L) 3.5 - 5.0 g/dL   AST 15 15 - 41 U/L   ALT 14 14 - 54 U/L   Alkaline Phosphatase 63 38 - 126 U/L   Total Bilirubin 1.3 (H) 0.3 - 1.2 mg/dL   GFR calc non Af Amer >60 >60 mL/min   GFR calc Af Amer >60 >60 mL/min    Comment: (NOTE) The eGFR has been calculated using the CKD EPI equation. This calculation has not been validated in all clinical situations. eGFR's persistently <60 mL/min signify possible Chronic Kidney Disease.    Anion gap 8 5 - 15    Comment: Performed at [facility], [address] [place], [place] [address]  CBC WITH DIFFERENTIAL     Status: Abnormal   Collection Time: 02/21/18  8:06 PM  Result Value Ref Range   WBC 4.3 3.6 - 11.0 K/uL   RBC 4.18 3.80 - 5.20 MIL/uL   Hemoglobin 12.6 12.0 - 16.0 g/dL   HCT 37.6 35.0 - 47.0 %   MCV 90.0 80.0 - 100.0 fL   MCH 30.3 26.0 - 34.0 pg   MCHC 33.6 32.0 - 36.0 g/dL   RDW 15.8 (H) 11.5 - 14.5 %   Platelets 196 150 - 440 K/uL   Neutrophils Relative % 74 %   Neutro Abs 3.2 1.4 - 6.5 K/uL   Lymphocytes Relative 8 %   Lymphs Abs 0.3 (L) 1.0 - 3.6 K/uL   Monocytes Relative 10 %   Monocytes Absolute 0.4 0.2 - 0.9 K/uL   Eosinophils Relative 8 %   Eosinophils Absolute 0.4 0 - 0.7 K/uL   Basophils Relative 0 %   Basophils Absolute 0.0 0 - 0.1 K/uL    Comment: Performed at [facility], [address] [place], [place] [address]  Blood Culture (routine x 2)     Status: None   Collection Time: 02/21/18  8:06 PM  Result Value Ref Range   Specimen Description BLOOD BLOOD LEFT HAND    Special Requests      BOTTLES DRAWN AEROBIC  AND ANAEROBIC Blood Culture adequate volume   Culture      NO GROWTH  5 DAYS Performed at [facility], [address] [place], [place] [address]    Report Status 02/26/2018 FINAL   Protime-INR     Status: Abnormal   Collection Time: 02/21/18  8:06 PM  Result Value Ref Range   Prothrombin Time 15.8 (H) 11.4 - 15.2 seconds   INR 1.27     Comment: Performed at [facility], [address] [place], [place] [address]  Blood Culture (routine x 2)     Status: None   Collection Time: 02/21/18  8:08 PM  Result Value Ref Range   Specimen Description BLOOD LEFT ANTECUBITAL    Special Requests      BOTTLES DRAWN AEROBIC AND ANAEROBIC Blood Culture adequate volume   Culture      NO GROWTH 5 DAYS Performed at [facility], [address] [place], [place] [address]    Report Status 02/26/2018 FINAL   Urinalysis, Routine w reflex microscopic     Status: Abnormal   Collection Time: 02/21/18  9:02 PM  Result Value Ref Range   Color, Urine YELLOW (A) YELLOW   APPearance CLOUDY (A) CLEAR   Specific Gravity, Urine 1.015 1.005 - 1.030   pH 6.0 5.0 - 8.0   Glucose, UA NEGATIVE NEGATIVE mg/dL   Hgb urine dipstick SMALL (A) NEGATIVE   Bilirubin Urine NEGATIVE NEGATIVE   Ketones, ur NEGATIVE NEGATIVE mg/dL   Protein, ur 30 (A) NEGATIVE mg/dL   Nitrite POSITIVE (A) NEGATIVE   Leukocytes, UA MODERATE (A) NEGATIVE   RBC / HPF 0-5 0 - 5 RBC/hpf   WBC, UA 21-50 0 - 5 WBC/hpf   Bacteria, UA MANY (A) NONE SEEN   Squamous Epithelial / LPF 0-5 0 - 5    Comment: Please note change in reference range.   WBC Clumps PRESENT    Mucus PRESENT     Comment: Performed at [facility], [address] [place], [place] [address]  Urine culture     Status: Abnormal   Collection Time: 02/21/18  9:02 PM  Result Value Ref Range   Specimen Description      URINE, CATHETERIZED Performed at [facility], [address] [place], [place] [address]    Special  Requests      NONE Performed at [facility], [place],  [address]    Culture >=100,000 COLONIES/mL KLEBSIELLA PNEUMONIAE (A)    Report Status 02/24/2018 FINAL    Organism ID, Bacteria KLEBSIELLA PNEUMONIAE (A)       Susceptibility   Klebsiella pneumoniae - MIC*    AMPICILLIN RESISTANT Resistant     CEFAZOLIN <=4 SENSITIVE Sensitive     CEFTRIAXONE <=1 SENSITIVE Sensitive     CIPROFLOXACIN <=0.25 SENSITIVE Sensitive     GENTAMICIN <=1 SENSITIVE Sensitive     IMIPENEM <=0.25 SENSITIVE Sensitive     NITROFURANTOIN 64 INTERMEDIATE Intermediate     TRIMETH/SULFA <=20 SENSITIVE Sensitive     AMPICILLIN/SULBACTAM <=2 SENSITIVE Sensitive     PIP/TAZO <=4 SENSITIVE Sensitive     Extended ESBL NEGATIVE Sensitive     * >=100,000 COLONIES/mL KLEBSIELLA PNEUMONIAE  Basic metabolic panel     Status: Abnormal   Collection Time: 02/22/18  4:25 AM  Result Value Ref Range   Sodium 136 135 - 145 mmol/L   Potassium 3.0 (L) 3.5 - 5.1 mmol/L   Chloride 103 101 - 111 mmol/L   CO2 24 22 - 32 mmol/L   Glucose, Bld 118 (H) 65 -  99 mg/dL   BUN 7 6 - 20 mg/dL   Creatinine, Ser 0.60 0.44 - 1.00 mg/dL   Calcium 7.9 (L) 8.9 - 10.3 mg/dL   GFR calc non Af Amer >60 >60 mL/min   GFR calc Af Amer >60 >60 mL/min    Comment: (NOTE) The eGFR has been calculated using the CKD EPI equation. This calculation has not been validated in all clinical situations. eGFR's persistently <60 mL/min signify possible Chronic Kidney Disease.    Anion gap 9 5 - 15    Comment: Performed at [facility], [address] [place], [place] [address]  CBC     Status: Abnormal   Collection Time: 02/22/18  4:25 AM  Result Value Ref Range   WBC 3.7 3.6 - 11.0 K/uL   RBC 3.97 3.80 - 5.20 MIL/uL   Hemoglobin 12.3 12.0 - 16.0 g/dL   HCT 35.3 35.0 - 47.0 %   MCV 88.8 80.0 - 100.0 fL   MCH 30.9 26.0 - 34.0 pg   MCHC 34.8 32.0 - 36.0 g/dL   RDW 15.4 (H) 11.5 - 14.5 %   Platelets 156 150 -  440 K/uL    Comment: Performed at [facility], [address] [place], [place] [address]  Magnesium     Status: Abnormal   Collection Time: 02/22/18  4:25 AM  Result Value Ref Range   Magnesium 1.4 (L) 1.7 - 2.4 mg/dL    Comment: Performed at [facility], [address] [place], [place] [address]  Basic metabolic panel     Status: Abnormal   Collection Time: 02/23/18  4:54 AM  Result Value Ref Range   Sodium 132 (L) 135 - 145 mmol/L   Potassium 4.0 3.5 - 5.1 mmol/L   Chloride 102 101 - 111 mmol/L   CO2 24 22 - 32 mmol/L   Glucose, Bld 110 (H) 65 - 99 mg/dL   BUN 7 6 - 20 mg/dL   Creatinine, Ser 0.62 0.44 - 1.00 mg/dL   Calcium 7.9 (L) 8.9 - 10.3 mg/dL   GFR calc non Af Amer >60 >60 mL/min   GFR calc Af Amer >60 >60 mL/min    Comment: (NOTE) The eGFR has been calculated using the CKD EPI equation. This calculation has not been validated in all clinical situations. eGFR's persistently <60 mL/min signify possible Chronic Kidney Disease.    Anion gap 6 5 - 15    Comment: Performed at [facility], [address] [place], [place] [address]  Magnesium     Status: None   Collection Time: 02/23/18  4:54 AM  Result Value Ref Range   Magnesium 1.8 1.7 - 2.4 mg/dL    Comment: Performed at [facility], [address] [place], [place] [address]  Basic metabolic panel     Status: Abnormal   Collection Time: 02/25/18  8:02 AM  Result Value Ref Range   Sodium 127 (L) 135 - 145 mmol/L   Potassium 3.5 3.5 - 5.1 mmol/L   Chloride 96 (L) 101 - 111 mmol/L   CO2 21 (L) 22 - 32 mmol/L   Glucose, Bld 108 (H) 65 - 99 mg/dL   BUN 8 6 - 20 mg/dL   Creatinine, Ser 0.67 0.44 - 1.00 mg/dL   Calcium 7.9 (L) 8.9 - 10.3 mg/dL   GFR calc non Af Amer >60 >60 mL/min   GFR calc Af Amer >60 >60 mL/min    Comment: (NOTE) The eGFR has been  calculated using the CKD EPI equation. This calculation has not been validated in all clinical situations. eGFR's  persistently <60 mL/min signify possible Chronic Kidney Disease.    Anion gap 10 5 - 15    Comment: Performed at [facility], [address] [place], [place] [address]  Magnesium     Status: None   Collection Time: 02/25/18  8:02 AM  Result Value Ref Range   Magnesium 1.8 1.7 - 2.4 mg/dL    Comment: Performed at [facility], [address] [place], [place] [address]  Phosphorus     Status: Abnormal   Collection Time: 02/25/18  8:02 AM  Result Value Ref Range   Phosphorus 2.1 (L) 2.5 - 4.6 mg/dL    Comment: Performed at [facility], [address] [place], [place] [address]   Objective  Body mass index is 23.01 kg/m. Wt Readings from Last 3 Encounters:  03/02/18 125 lb 12.8 oz (57.1 kg)  02/22/18 132 lb 12.8 oz (60.2 kg)  02/16/18 132 lb 9.7 oz (60.2 kg)   Temp Readings from Last 3 Encounters:  03/02/18 97.7 F (36.5 C) (Oral)  02/25/18 99 F (37.2 C) (Oral)  02/16/18 (!) 96.9 F (36.1 C)   BP Readings from Last 3 Encounters:  03/02/18 (!) 98/56  02/25/18 (!) 128/56  02/16/18 (!) 158/78   Pulse Readings from Last 3 Encounters:  03/02/18 89  02/25/18 91  02/16/18 72    Physical Exam  Constitutional: She is oriented to person, place, and time. Vital signs are normal. She appears well-developed and well-nourished. She is cooperative.  HENT:  Head: Normocephalic and atraumatic.  Mouth/Throat: Oropharynx is clear and moist and mucous membranes are normal.  Fluid in b/l ears   Eyes: Pupils are equal, round, and reactive to light. Conjunctivae are normal.  Cardiovascular: Normal rate, regular rhythm and normal heart sounds.  Pulmonary/Chest: Effort normal and breath sounds normal. She has no wheezes.  Neurological: She is alert and oriented to person, place, and time. Gait normal.  Skin: Skin is warm, dry and intact.  Psychiatric: She has a normal mood and affect. Her speech is normal and behavior is normal. Judgment and thought  content normal. Cognition and memory are normal.  Tearful on exam   Nursing note and vitals reviewed.   Assessment   1. Cough c/w HCAP 2. MM with chronic back pain  3. RUE DVT noted 02/16/18  4. Hyponatremia, hypomag, low protein 5. HM Plan   1. CXR today  Tessalon perles, flonase duoneb x 1  Robitussin/mucinex dm  Stop Keflex for UTI change to  2.  Prn pain meds  3. On eliquis  4. premeir protein shakes  Labs today  5.  Had flu shot  10/25/15 Tdap Need to check shingrix, prevnar, pna 23 and other vaccines   mammo ordered today pt needs to sch  Colonoscopy per pt had 3-4 years ago rectal bleeding h/o sigmoid colon resection  Pap sp hysterectomy endometrosis DEXA will disc in future per pt had 06/2014 osteopenia will need to check vit D today   F/u [facility] H/O Dr. Amalia Hailey 03/10/18 disc Zometa start       Provider: Dr. Olivia Mackie McLean-Scocuzza-Internal Medicine

## 2018-03-02 NOTE — Telephone Encounter (Signed)
Copied from [place] [id number]. Topic: Quick Communication - Other Results >> Mar 02, 2018  3:56 PM Synthia Innocent wrote: Requesting chest xray results

## 2018-03-02 NOTE — Telephone Encounter (Signed)
Please see results note. Patients caregiver is taking her to ED due to the pneumonia. Caregiver stated that is fine.

## 2018-03-03 DIAGNOSIS — I10 Essential (primary) hypertension: Secondary | ICD-10-CM

## 2018-03-03 DIAGNOSIS — C7951 Secondary malignant neoplasm of bone: Secondary | ICD-10-CM

## 2018-03-03 DIAGNOSIS — K219 Gastro-esophageal reflux disease without esophagitis: Secondary | ICD-10-CM

## 2018-03-03 DIAGNOSIS — J189 Pneumonia, unspecified organism: Secondary | ICD-10-CM

## 2018-03-03 DIAGNOSIS — E785 Hyperlipidemia, unspecified: Secondary | ICD-10-CM

## 2018-03-03 DIAGNOSIS — F329 Major depressive disorder, single episode, unspecified: Secondary | ICD-10-CM

## 2018-03-03 DIAGNOSIS — Z87891 Personal history of nicotine dependence: Secondary | ICD-10-CM

## 2018-03-03 DIAGNOSIS — Z79899 Other long term (current) drug therapy: Secondary | ICD-10-CM

## 2018-03-03 DIAGNOSIS — R5383 Other fatigue: Secondary | ICD-10-CM

## 2018-03-03 DIAGNOSIS — C9002 Multiple myeloma in relapse: Secondary | ICD-10-CM

## 2018-03-03 DIAGNOSIS — R5381 Other malaise: Secondary | ICD-10-CM

## 2018-03-03 DIAGNOSIS — D61818 Other pancytopenia: Secondary | ICD-10-CM

## 2018-03-03 DIAGNOSIS — R531 Weakness: Secondary | ICD-10-CM

## 2018-03-03 LAB — BASIC METABOLIC PANEL
Anion gap: 5 (ref 5–15)
BUN: 11 mg/dL (ref 6–20)
CHLORIDE: 108 mmol/L (ref 101–111)
CO2: 25 mmol/L (ref 22–32)
Calcium: 8 mg/dL — ABNORMAL LOW (ref 8.9–10.3)
Creatinine, Ser: 0.81 mg/dL (ref 0.44–1.00)
GFR calc Af Amer: >60 mL/min (ref >60)
GFR calc non Af Amer: >60 mL/min (ref >60)
Glucose, Bld: 111 mg/dL — ABNORMAL HIGH (ref 65–99)
POTASSIUM: 3.6 mmol/L (ref 3.5–5.1)
SODIUM: 138 mmol/L (ref 135–145)

## 2018-03-03 LAB — MRSA PCR SCREENING: MRSA by PCR: NEGATIVE

## 2018-03-03 LAB — URINALYSIS, ROUTINE W REFLEX MICROSCOPIC
BACTERIA UA: NONE SEEN
BILIRUBIN URINE: NEGATIVE
Glucose, UA: NEGATIVE mg/dL
Ketones, ur: NEGATIVE mg/dL
LEUKOCYTES UA: NEGATIVE
NITRITE: NEGATIVE
Protein, ur: NEGATIVE mg/dL
Specific Gravity, Urine: 1.006 (ref 1.005–1.030)
pH: 6 (ref 5.0–8.0)

## 2018-03-03 LAB — VITAMIN D 25 HYDROXY (VIT D DEFICIENCY, FRACTURES): VITD: 16.13 ng/mL — ABNORMAL LOW (ref 30.00–100.00)

## 2018-03-03 LAB — CBC
HEMATOCRIT: 28.3 % — AB (ref 35.0–47.0)
Hemoglobin: 9.7 g/dL — ABNORMAL LOW (ref 12.0–16.0)
MCH: 30.1 pg (ref 26.0–34.0)
MCHC: 34.4 g/dL (ref 32.0–36.0)
MCV: 87.5 fL (ref 80.0–100.0)
Platelets: 119 10*3/uL — ABNORMAL LOW (ref 150–440)
RBC: 3.24 MIL/uL — AB (ref 3.80–5.20)
RDW: 15.5 % — ABNORMAL HIGH (ref 11.5–14.5)
WBC: 1.5 10*3/uL — AB (ref 3.6–11.0)

## 2018-03-03 LAB — PROCALCITONIN: Procalcitonin: 0.31 ng/mL

## 2018-03-03 MED ORDER — ENSURE ENLIVE PO LIQD
237.0000 mL | Freq: Three times a day (TID) | ORAL | Status: DC
  Administered 2018-03-04: 237 mL via ORAL

## 2018-03-03 MED ORDER — DIPHENHYDRAMINE HCL 25 MG PO CAPS
25.0000 mg | ORAL_CAPSULE | Freq: Every evening | ORAL | Status: DC | PRN
  Administered 2018-03-03 – 2018-03-04 (×2): 25 mg via ORAL
  Filled 2018-03-03 (×2): qty 1

## 2018-03-03 MED ORDER — FLUTICASONE PROPIONATE 50 MCG/ACT NA SUSP
1.0000 | Freq: Every day | NASAL | Status: DC
  Administered 2018-03-03 – 2018-03-05 (×3): 2 via NASAL
  Filled 2018-03-03: qty 16

## 2018-03-03 MED ORDER — FUROSEMIDE 20 MG PO TABS: 20.0000 mg | ORAL_TABLET | Freq: Every day | ORAL | Status: DC | PRN

## 2018-03-03 NOTE — Progress Notes (Signed)
Initial Nutrition Assessment  DOCUMENTATION CODES:   Not applicable  INTERVENTION:  Recommend liberalizing diet to regular.  Provide Ensure Enlive po BID, each supplement provides 350 kcal and 20 grams of protein.  Provide Magic cup TID with meals, each supplement provides 290 kcal and 9 grams of protein.  Encouraged adequate intake of calories and protein at meals. Discussed snacks available on unit.  NUTRITION DIAGNOSIS:   Increased nutrient needs related to catabolic illness, cancer and cancer related treatments as evidenced by estimated needs.  GOAL:   Patient will meet greater than or equal to 90% of their needs  MONITOR:   PO intake, Supplement acceptance, Labs, Weight trends, I & O's  REASON FOR ASSESSMENT:   Malnutrition Screening Tool    ASSESSMENT:   72 year old female with PMHx of GERD, HTN, asthma, multiple myeloma on chemotherapy, hx diverticulitis with perforation in 02/2017 that resulted in colostomy now s/p takedown, HLD, vitamin D deficiency who is now admitted with sepsis due to PNA and neutropenia from chemotherapy.   Met with patient at bedside. She reports her appetite has been decreased for 1.5 weeks now. She reports she is not having any N/V or taste changes from her chemotherapy. She does report that she has been getting sick frequently. She typically eats 2 meals each day and then snacks between meals. Lately she has only felt like eating things such as gelatin, ice cream, and pudding. She is amenable to trying Ensure Enlive and Magic Cup to help meet calorie/protein needs.  Patient reports her UBW is 132 lbs and that she has lost weight in the past week. Per chart she was 132.8 lbs on 02/22/2018 and has lost 3.4 lbs (2.6% body weight) over the past week, which is significant for time frame.  Medications reviewed and include: Eliquis, pantoprazole, cefepime.  Labs reviewed: Magnesium 1.6.  Patient is at risk for development of  malnutrition.  NUTRITION - FOCUSED PHYSICAL EXAM:    Most Recent Value  Orbital Region  No depletion  Upper Arm Region  Mild depletion  Thoracic and Lumbar Region  No depletion  Buccal Region  No depletion  Temple Region  Mild depletion  Clavicle Bone Region  Mild depletion  Clavicle and Acromion Bone Region  Mild depletion  Scapular Bone Region  Mild depletion  Dorsal Hand  Mild depletion  Patellar Region  Mild depletion  Anterior Thigh Region  Mild depletion  Posterior Calf Region  Mild depletion  Edema (RD Assessment)  None  Hair  Reviewed  Eyes  Reviewed  Mouth  Reviewed  Skin  Reviewed  Nails  Reviewed     Diet Order:   Diet Order           Diet Heart Room service appropriate? Yes; Fluid consistency: Thin  Diet effective now          EDUCATION NEEDS:   No education needs have been identified at this time  Skin:  Skin Assessment: Reviewed RN Assessment(MSAD to buttocks)  Last BM:  03/03/2018 - medium type 6  Height:   Ht Readings from Last 1 Encounters:  03/02/18 5' 2" (1.575 m)    Weight:   Wt Readings from Last 1 Encounters:  03/02/18 129 lb 6.4 oz (58.7 kg)    Ideal Body Weight:  50 kg  BMI:  Body mass index is 23.67 kg/m.  Estimated Nutritional Needs:   Kcal:  1385-1600 (MSJ x 1.3-1.5)  Protein:  70-90 grams (1.2-1.5 grams/kg)  Fluid:  1.5-1.7 L/day (  25-30 mL/kg)  Leanne Stephens, MS, RD, LDN Office: [phone] Pager: [phone] After Hours/Weekend Pager: [phone]  

## 2018-03-03 NOTE — Evaluation (Signed)
Physical Therapy Evaluation Patient Details Name: Cassie Jones MRN: [id number] DOB: [birth year] Today's Date: 03/03/2018   History of Present Illness  Patient is a 72 year old female admitted from home with multifocal pneumonia, hypokalemia, chemontherapy induced hypokalemia.  PMH includes UTI, multiple myeloma, ascites, htn, HLD, diverticulitis, colon polyps and chicken pox.  Clinical Impression  Pt is a 72 year old female who lives in a one story home with her brother and sister-in-law.  Pt is in bed and reports no pain upon PT arrival.  She reports that she is very fatigued but willing to work with PT.  Able to complete bed mobility independently.  She is also able to perform STS without difficulty.  Pt presents with overall weakness of UE and LE bilaterally and decreased sensation in anterior 1/2 of feet.  Pt ambulates 40 ft in room with 4 rest breaks and use of RW.  Pt reported fatigue following but was able to perform sit to stand with controlled descent.  PT introduced there ex and pt was able to complete sets of 10 slowly.  Pt will benefit from skilled PT with focus on strength, tolerance to activity, use of AD, balance and fall prevention.    Follow Up Recommendations Home health PT    Equipment Recommendations  None recommended by PT    Recommendations for Other Services       Precautions / Restrictions Precautions Precautions: Fall Precaution Comments: Moderate Fall Risk Restrictions Weight Bearing Restrictions: No      Mobility  Bed Mobility Overal bed mobility: Independent                Transfers Overall transfer level: Independent                  Ambulation/Gait Ambulation/Gait assistance: Supervision Ambulation Distance (Feet): 40 Feet Assistive device: Rolling walker (2 wheeled)   Gait velocity: Decreased Gait velocity interpretation: 1.31 - 2.62 ft/sec, indicative of limited community ambulator General Gait Details: Moderate foot  clearance, appropriate use of RW, requires 4 standing rest breaks within 40 ft of ambulation.  Pt reports increased fatigue following ambulation and is able to sit in recliner with good body mechanics.  Stairs            Wheelchair Mobility    Modified Rankin (Stroke Patients Only)       Balance Overall balance assessment: Needs assistance Sitting-balance support: Feet supported Sitting balance-Leahy Scale: Good     Standing balance support: Bilateral upper extremity supported Standing balance-Leahy Scale: Fair Standing balance comment: Safer with RW.  Able to perform turns and backward walking with RW safely.                             Pertinent Vitals/Pain Pain Assessment: No/denies pain    Home Living Family/patient expects to be discharged to:: Private residence Living Arrangements: Other relatives(Brother and sister in law.) Available Help at Discharge: Family;Available 24 hours/day Type of Home: House Home Access: Stairs to enter Entrance Stairs-Rails: Can reach both Entrance Stairs-Number of Steps: 1 Home Layout: One level Home Equipment: Walker - 2 wheels;Cane - single point      Prior Function Level of Independence: Independent with assistive device(s)         Comments: Pt's family takes care of grocery shopping since she has stopped driving.  She has a RW which she is able to use but sometimes "furniture cruises" when in more enclosed spaces  of the house.     Hand Dominance        Extremity/Trunk Assessment   Upper Extremity Assessment Upper Extremity Assessment: Generalized weakness(Grossly 3+/5 bilaterally.)    Lower Extremity Assessment Lower Extremity Assessment: Generalized weakness(Grossly 4-/5 bilaterally.  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  A&O to self and situation and able to follow commands.      General Comments      Exercises Total Joint Exercises Ankle Circles/Pumps: Strengthening;Both;10 reps;Seated Heel Slides: (1-2 reps bilaterally for instruction) Hip ABduction/ADduction: (1-2 reps bilaterally for instruction) Long Arc Quad: Both;10 reps;Seated;Strengthening Marching in Standing: Both;Seated;10 reps;Strengthening Other Exercises Other Exercises: Discussed importance of use of AD for fall prevention and balance. Other Exercises: Educated concerning managment of HEP and participation with home health PT.   Assessment/Plan    PT Assessment Patient needs continued PT services  PT Problem List Decreased strength;Decreased activity tolerance       PT Treatment Interventions Gait training;Therapeutic activities;Therapeutic exercise;Patient/family education;Balance training    PT Goals (Current goals can be found in the Care Plan section)  Acute Rehab PT Goals Patient Stated Goal: To regain strength and indepdendence. PT Goal Formulation: With patient Time For Goal Achievement: 03/17/18 Potential to Achieve Goals: Good    Frequency Min 2X/week   Barriers to discharge        Co-evaluation               AM-PAC PT "6 Clicks" Daily Activity  Outcome Measure Difficulty turning over in bed (including adjusting bedclothes, sheets and blankets)?: None Difficulty moving from lying on back to sitting on the side of the bed? : None Difficulty sitting down on and standing up from a chair with arms (e.g., wheelchair, bedside commode, etc,.)?: None Help needed moving to and from a bed to chair (including a wheelchair)?: None Help needed walking in  hospital room?: A Little Help needed climbing 3-5 steps with a railing? : A Little 6 Click Score: 22    End of Session Equipment Utilized During Treatment: Gait belt Activity Tolerance: Patient tolerated treatment well Patient left: in chair;with family/visitor present;with call bell/phone within reach;Other (comment)(No chair alarm required per nursing) Nurse Communication: Mobility status PT Visit Diagnosis: Muscle weakness (generalized) (M62.81);Difficulty in walking, not elsewhere classified (R26.2)    Time: 1450-1520 PT Time Calculation (min) (ACUTE ONLY): 30 min   Charges:   PT Evaluation $PT Eval Low Complexity: 1 Low PT Treatments $Therapeutic Exercise: 8-22 mins   PT G Codes:   PT G-Codes **NOT FOR INPATIENT CLASS** Functional Assessment Tool Used: AM-PAC 6 Clicks Basic Mobility    Roxanne Gates, PT, DPT  Roxanne Gates 03/03/2018, 3:30 PM

## 2018-03-03 NOTE — Consult Note (Signed)
[place]  [phone]) [phone] Fax:([phone]) [phone]  ID: Cassie Jones OB: 1946/08/17  MR#: [id number]  QIO#:[id number]  Patient Care Team: McLean-Scocuzza, Nino Glow, MD as PCP - General (Internal Medicine)  CHIEF COMPLAINT: Multiple myeloma, community-acquired pneumonia.  INTERVAL HISTORY: Patient is a 72 year old female who is actively receiving chemotherapy for multiple myeloma who was recently admitted to the hospital with increasing weakness and fatigue, cough and congestion.  Patient states she does not feel much improved since admission.  She has no neurologic complaints.  She denies any fevers.  Her weakness and fatigue are unchanged.  She denies any chest pain.  She has a fair appetite, but denies weight loss.  She has no nausea, vomiting, constipation, or diarrhea.  She has no urinary complaints.  Patient feels generally terrible, but offers no further specific complaints.  REVIEW OF SYSTEMS:   Review of Systems  Constitutional: Positive for malaise/fatigue. Negative for fever and weight loss.  Respiratory: Positive for shortness of breath. Negative for cough.   Cardiovascular: Negative.  Negative for chest pain and leg swelling.  Gastrointestinal: Negative.  Negative for abdominal pain.  Genitourinary: Negative.  Negative for dysuria.  Musculoskeletal: Negative.  Negative for back pain.  Skin: Negative.  Negative for rash.  Neurological: Negative.  Negative for sensory change, focal weakness and weakness.  Psychiatric/Behavioral: Positive for depression. The patient is not nervous/anxious.     As per HPI. Otherwise, a complete review of systems is negative.  PAST MEDICAL HISTORY: Past Medical History:  Diagnosis Date  . Ascites   . Asthma   . Bilateral leg edema   . Cancer ([place])   . Chicken pox   . Colon polyps   . Diverticulitis    with perforation 02/2017 and hosp with colostomy bag s/p removal   . GERD (gastroesophageal reflux disease)    . Hyperlipidemia   . Hypertension   . Multiple myeloma ([place])    dx'ed 01/2018 follows Dr. Tasia Catchings and [facility] H/O   . Pleural effusion   . Thyroid nodule   . UTI (urinary tract infection)   . Vitamin D deficiency     PAST SURGICAL HISTORY: Past Surgical History:  Procedure Laterality Date  . ABDOMINAL HYSTERECTOMY    . BREAST BIOPSY Bilateral yrs ago   benign  . CHOLECYSTECTOMY    . COLOSTOMY  2018  . COLOSTOMY REVERSAL     11 2018  . KYPHOPLASTY     11/2017 [place]     FAMILY HISTORY: Family History  Problem Relation Age of Onset  . Breast cancer Mother 32  . Cancer Mother        breast   . Diabetes Mother   . Heart disease Mother   . Hyperlipidemia Mother   . Hypertension Mother   . Birth defects Brother   . Diabetes Brother   . Heart disease Brother   . Hyperlipidemia Brother   . Cancer Father        prostate met to liver     ADVANCED DIRECTIVES (Y/N):  '@ADVDIR'$ @  HEALTH MAINTENANCE: Social History   Tobacco Use  . Smoking status: Former Research scientist (life sciences)  . Smokeless tobacco: Never Used  Substance Use Topics  . Alcohol use: Not Currently  . Drug use: Never     Colonoscopy:  PAP:  Bone density:  Lipid panel:  Allergies  Allergen Reactions  . Codeine Nausea And Vomiting    Current Facility-Administered Medications  Medication Dose Route Frequency Provider Last Rate Last Dose  .  acetaminophen (TYLENOL) tablet 650 mg  650 mg Oral Q6H PRN Demetrios Loll, MD   650 mg at 03/03/18 2039   Or  . acetaminophen (TYLENOL) suppository 650 mg  650 mg Rectal Q6H PRN Demetrios Loll, MD      . albuterol (PROVENTIL) (2.5 MG/3ML) 0.083% nebulizer solution 2.5 mg  2.5 mg Nebulization Q2H PRN Demetrios Loll, MD      . apixaban Arne Cleveland) tablet 5 mg  5 mg Oral BID Demetrios Loll, MD   5 mg at 03/03/18 9735  . benzonatate (TESSALON) capsule 200 mg  200 mg Oral TID PRN Demetrios Loll, MD      . bisacodyl (DULCOLAX) EC tablet 5 mg  5 mg Oral Daily PRN Demetrios Loll, MD      . budesonide (PULMICORT)  nebulizer solution 0.25 mg  0.25 mg Nebulization BID Demetrios Loll, MD   0.25 mg at 03/03/18 2029  . ceFEPIme (MAXIPIME) 2 g in sodium chloride 0.9 % 100 mL IVPB  2 g Intravenous Q12H Demetrios Loll, MD   Stopped at 03/03/18 1132  . diphenhydrAMINE (BENADRYL) capsule 25 mg  25 mg Oral QHS PRN Gouru, Aruna, MD      . feeding supplement (ENSURE ENLIVE) (ENSURE ENLIVE) liquid 237 mL  237 mL Oral TID BM Fritzi Mandes, MD      . fluticasone (FLONASE) 50 MCG/ACT nasal spray 1-2 spray  1-2 spray Each Nare Daily Fritzi Mandes, MD   2 spray at 03/03/18 1133  . furosemide (LASIX) tablet 20 mg  20 mg Oral Daily PRN Fritzi Mandes, MD      . guaiFENesin (ROBITUSSIN) 100 MG/5ML solution 100 mg  5 mL Oral Q4H PRN Demetrios Loll, MD      . loperamide (IMODIUM) capsule 2 mg  2 mg Oral QID PRN Demetrios Loll, MD      . montelukast (SINGULAIR) tablet 10 mg  10 mg Oral QHS Demetrios Loll, MD   10 mg at 03/02/18 2313  . ondansetron (ZOFRAN) tablet 4 mg  4 mg Oral Q6H PRN Demetrios Loll, MD       Or  . ondansetron [facility]) injection 4 mg  4 mg Intravenous Q6H PRN Demetrios Loll, MD   4 mg at 03/03/18 2038  . oxyCODONE (OXYCONTIN) 12 hr tablet 10 mg  10 mg Oral Q12H PRN Demetrios Loll, MD      . pantoprazole (PROTONIX) EC tablet 40 mg  40 mg Oral Inez Catalina, Sheppard Evens, MD   40 mg at 03/02/18 2314  . pravastatin (PRAVACHOL) tablet 10 mg  10 mg Oral QHS Demetrios Loll, MD   10 mg at 03/02/18 2314  . senna-docusate (Senokot-S) tablet 1 tablet  1 tablet Oral QHS PRN Demetrios Loll, MD        OBJECTIVE: Vitals:   03/03/18 1522 03/03/18 2014  BP: (!) 119/50 (!) 124/56  Pulse: 76 79  Resp: (!) 32 18  Temp: 98.9 F (37.2 C) (!) 100.5 F (38.1 C)  SpO2: 98% 93%     Body mass index is 23.67 kg/m.    ECOG FS:2 - Symptomatic, <50% confined to bed  General: Ill-appearing, no acute distress. Eyes: Pink conjunctiva, anicteric sclera. HEENT: Normocephalic, moist mucous membranes, clear oropharnyx. Lungs: Clear to auscultation bilaterally. Heart: Regular rate and  rhythm. No rubs, murmurs, or gallops. Abdomen: Soft, nontender, nondistended. No organomegaly noted, normoactive bowel sounds. Musculoskeletal: No edema, cyanosis, or clubbing. Neuro: Alert, answering all questions appropriately. Cranial nerves grossly intact. Skin: No rashes or petechiae noted. Psych: Normal  affect.  LAB RESULTS:  Lab Results  Component Value Date   NA 138 03/03/2018   K 3.6 03/03/2018   CL 108 03/03/2018   CO2 25 03/03/2018   GLUCOSE 111 (H) 03/03/2018   BUN 11 03/03/2018   CREATININE 0.81 03/03/2018   CALCIUM 8.0 (L) 03/03/2018   PROT 6.5 03/02/2018   ALBUMIN 2.9 (L) 03/02/2018   AST 20 03/02/2018   ALT 16 03/02/2018   ALKPHOS 55 03/02/2018   BILITOT 1.3 (H) 03/02/2018   GFRNONAA >60 03/03/2018   GFRAA >60 03/03/2018    Lab Results  Component Value Date   WBC 1.5 (L) 03/03/2018   NEUTROABS 0.6 (L) 03/02/2018   HGB 9.7 (L) 03/03/2018   HCT 28.3 (L) 03/03/2018   MCV 87.5 03/03/2018   PLT 119 (L) 03/03/2018     STUDIES: Dg Chest 2 View  Result Date: 03/02/2018 CLINICAL DATA:  Cough for the past 5 days associated with chest congestion and shortness of breath. EXAM: CHEST - 2 VIEW COMPARISON:  Chest x-ray of Feb 21, 2018 FINDINGS: The lungs are well-expanded. The interstitial markings have become markedly more conspicuous bilaterally. An area of confluent density is noted at the left lung base. The heart and pulmonary vascularity are normal. The mediastinum is normal in width. IMPRESSION: Widespread interstitial infiltrates worrisome for pneumonia much more conspicuous than on the previous study. There is an area of airspace opacity in the lingula that may also reflect pneumonia. No CHF. Followup PA and lateral chest X-ray is recommended in 3-4 weeks following trial of antibiotic therapy to ensure resolution and exclude underlying malignancy. Thoracic aortic atherosclerosis. Electronically Signed   By: David  Martinique M.D.   On: 03/02/2018 16:02   Dg Chest 2  View  Result Date: 02/05/2018 CLINICAL DATA:  Cough and wheezing.  Multiple myeloma EXAM: CHEST - 2 VIEW COMPARISON:  January 14, 2018 FINDINGS: There are scattered areas of mild scarring bilaterally. No edema or consolidation. Heart size and pulmonary vascularity are normal. No adenopathy. There is aortic atherosclerosis. There is calcification in the left carotid artery. No blastic or lytic bone lesions are evident. There is evidence of previous kyphoplasty procedure in the mid lumbar region. IMPRESSION: Scattered areas of scarring bilaterally. No edema or consolidation. There is aortic atherosclerosis as well as left carotid artery calcification. No adenopathy evident. Aortic Atherosclerosis (ICD10-I70.0). Electronically Signed   By: Lowella Grip III M.D.   On: 02/05/2018 10:08   US Venous Img Upper Uni Right  Result Date: 02/16/2018 CLINICAL DATA:  Pain and swelling x2 days. EXAM: RIGHT UPPER EXTREMITY VENOUS DOPPLER ULTRASOUND TECHNIQUE: Gray-scale sonography with graded compression, as well as color Doppler and duplex ultrasound were performed to evaluate the upper extremity deep venous system from the level of the subclavian vein and including the jugular, axillary, basilic and upper cephalic vein. Spectral Doppler was utilized to evaluate flow at rest and with distal augmentation maneuvers. COMPARISON:  None. FINDINGS: Thrombus within deep veins: Present in paired brachial veins and ulnar vein. Compressibility of deep veins: Incomplete in the affected brachial , ulnar, and basilic veins. Duplex waveform respiratory phasicity:  Normal. Duplex waveform response to augmentation:  Normal. Venous reflux:  None visualized. Other findings: Incompressible thrombus in the basilic vein through its length. Negative limited and images of the contralateral left subclavian vein. IMPRESSION: 1. POSITIVE for right upper extremity DVT involving ulnar, paired brachial, and basilic veins. These results will be called  to the ordering clinician or representative by the Radiologist  Assistant, and communication documented in the PACS or zVision Dashboard. Electronically Signed   By: Lucrezia Europe M.D.   On: 02/16/2018 15:36   Dg Chest Port 1 View  Result Date: 02/21/2018 CLINICAL DATA:  Pt arrives via ems from home, pt is a receiving chemo for her multiple myeloma, pt started running a fever 2 days ago. Pt denies cough or congestion, states a little burning today when she urinates, pt was recently diagnosed with.*comment was truncated* EXAM: PORTABLE CHEST 1 VIEW COMPARISON:  None. FINDINGS: Normal mediastinum and cardiac silhouette. Normal pulmonary vasculature. No evidence of effusion, infiltrate, or pneumothorax. No acute bony abnormality. IMPRESSION: No acute cardiopulmonary process. Electronically Signed   By: Suzy Bouchard M.D.   On: 02/21/2018 21:06    ASSESSMENT: Multiple myeloma, community-acquired pneumonia.  PLAN:    1.  Community-acquired pneumonia: Blood cultures were negative.  Patient currently receiving IV cefepime. 2.  Multiple myeloma: Patient currently receiving daratumumab with her last infusion on February 16, 2018.  She is also on oral Pomalyst which is currently on hold in the hospital.  Patient has been instructed to keep her previously scheduled follow-up appointment with Dr. Tasia Catchings at the end of May for further evaluation and discussion of reinitiation of treatment. 3.  Pancytopenia: Likely secondary to chemotherapy.  Monitor.  Appreciate consult, will follow.  Lloyd Huger, MD   03/03/2018 10:42 PM

## 2018-03-03 NOTE — Progress Notes (Signed)
Patient presently resting in the bed, alert and oriented, denies any pain vss, mood calm, patient participate in care wit pt, assisted to [facility] as needed.

## 2018-03-03 NOTE — Progress Notes (Signed)
[place] [facility] [place] NAME: Cassie Jones    MR#:  [id number]  DATE OF BIRTH:  15-Oct-1946  SUBJECTIVE:  patient came in with increasing weakness cough congestion found to have right middle lobe pneumonia. She feels weak and tired.  REVIEW OF SYSTEMS:   Review of Systems  Constitutional: Positive for malaise/fatigue. Negative for chills, fever and weight loss.  HENT: Negative for ear discharge, ear pain and nosebleeds.   Eyes: Negative for blurred vision, pain and discharge.  Respiratory: Positive for cough and shortness of breath. Negative for sputum production, wheezing and stridor.   Cardiovascular: Negative for chest pain, palpitations, orthopnea and PND.  Gastrointestinal: Negative for abdominal pain, diarrhea, nausea and vomiting.  Genitourinary: Negative for frequency and urgency.  Musculoskeletal: Negative for back pain and joint pain.  Neurological: Positive for weakness. Negative for sensory change, speech change and focal weakness.  Psychiatric/Behavioral: Negative for depression and hallucinations. The patient is not nervous/anxious.    Tolerating Diet:yes Tolerating PT: ambulatory at home   DRUG ALLERGIES:   Allergies  Allergen Reactions  . Codeine Nausea And Vomiting    VITALS:  Blood pressure (!) 116/48, pulse 73, temperature 98.8 F (37.1 C), temperature source Oral, resp. rate 19, height '5\' 2"'$  (1.575 m), weight 58.7 kg (129 lb 6.4 oz), SpO2 92 %.  PHYSICAL EXAMINATION:   Physical Exam  GENERAL:  72 y.o.-year-old patient lying in the bed with no acute distress. Thin cachectic EYES: Pupils equal, round, reactive to light and accommodation. No scleral icterus. Extraocular muscles intact.  HEENT: Head atraumatic, normocephalic. Oropharynx and nasopharynx clear.  NECK:  Supple, no jugular venous distention. No thyroid enlargement, no tenderness.  LUNGS: distant breath sounds bilaterally, no wheezing, rales,  rhonchi. No use of accessory muscles of respiration.  CARDIOVASCULAR: S1, S2 normal. No murmurs, rubs, or gallops.  ABDOMEN: Soft, nontender, nondistended. Bowel sounds present. No organomegaly or mass.  EXTREMITIES: No cyanosis, clubbing or edema b/l.    NEUROLOGIC: Cranial nerves II through XII are intact. No focal Motor or sensory deficits b/l.   PSYCHIATRIC:  patient is alert and oriented x 3.  SKIN: No obvious rash, lesion, or ulcer.   LABORATORY PANEL:  CBC Recent Labs  Lab 03/03/18 0420  WBC 1.5*  HGB 9.7*  HCT 28.3*  PLT 119*    Chemistries  Recent Labs  Lab 03/02/18 1715 03/02/18 2221 03/03/18 0420  NA 134*  --  138  K 3.0*  --  3.6  CL 96*  --  108  CO2 27  --  25  GLUCOSE 129*  --  111*  BUN 14  --  11  CREATININE 0.83  --  0.81  CALCIUM 8.6*  --  8.0*  MG  --  1.6*  --   AST 20  --   --   ALT 16  --   --   ALKPHOS 55  --   --   BILITOT 1.3*  --   --    Cardiac Enzymes Recent Labs  Lab 03/02/18 1715  TROPONINI <0.03   RADIOLOGY:  Dg Chest 2 View  Result Date: 03/02/2018 CLINICAL DATA:  Cough for the past 5 days associated with chest congestion and shortness of breath. EXAM: CHEST - 2 VIEW COMPARISON:  Chest x-ray of Feb 21, 2018 FINDINGS: The lungs are well-expanded. The interstitial markings have become markedly more conspicuous bilaterally. An area of confluent density is noted at the left lung base. The  heart and pulmonary vascularity are normal. The mediastinum is normal in width. IMPRESSION: Widespread interstitial infiltrates worrisome for pneumonia much more conspicuous than on the previous study. There is an area of airspace opacity in the lingula that may also reflect pneumonia. No CHF. Followup PA and lateral chest X-ray is recommended in 3-4 weeks following trial of antibiotic therapy to ensure resolution and exclude underlying malignancy. Thoracic aortic atherosclerosis. Electronically Signed   By: David  Martinique M.D.   On: 03/02/2018 16:02    ASSESSMENT AND PLAN:  Cassie Jones  is a 72 y.o. female with a known history of multiple myeloma, status post chemotherapy 2 weeks ago, hypertension, hyperlipidemia, pleural effusion, asthma and ascites.  The patient was recently treated with antibiotics for UTI.  She started to have cough, sputum and the shortness of breath a few days ago.  1. 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 MRSA PCR negative -received IV fluids -blood culture negative  2. Pancytopenia suspected due to chemo for multiple myeloma -Dr. Grayland Ormond to see patient -continue to monitor counts  3. Hypokalemia repeated with oral K  4. Generalized weakness deconditioning physical therapy to see  5. History of right upper extremity DVT--- based off an ultrasound on an end of April. Patient will continue her eliquis  6. Jerrye Bushy continue Protonix   Case discussed with Care Management/Social Worker. Management plans discussed with the patient, family and they are in agreement.  CODE STATUS: DNR  DVT Prophylaxis: eliquis  TOTAL TIME TAKING CARE OF THIS PATIENT: *25* minutes.  >50% time spent on counselling and coordination of care  POSSIBLE D/C IN *1-2 DAYS, DEPENDING ON CLINICAL CONDITION.  Note: This dictation was prepared with Dragon dictation along with smaller phrase technology. Any transcriptional errors that result from this process are unintentional.  Fritzi Mandes M.D on 03/03/2018 at 2:22 PM  Between 7am to 6pm - Pager - [phone]  After 6pm go to [email or web address] - password [email or web address] [facility]  Office  [phone]  CC: Primary care physician; McLean-Scocuzza, Nino Glow, MDPatient ID: Cassie Jones, female   DOB: 1946/03/31, 72 y.o.   MRN: [id number]

## 2018-03-04 ENCOUNTER — Other Ambulatory Visit: Payer: Self-pay | Admitting: Internal Medicine

## 2018-03-04 DIAGNOSIS — E559 Vitamin D deficiency, unspecified: Secondary | ICD-10-CM

## 2018-03-04 LAB — STREP PNEUMONIAE URINARY ANTIGEN: STREP PNEUMO URINARY ANTIGEN: NEGATIVE

## 2018-03-04 LAB — PROCALCITONIN: Procalcitonin: 0.2 ng/mL

## 2018-03-04 MED ORDER — CEFDINIR 300 MG PO CAPS
300.0000 mg | ORAL_CAPSULE | Freq: Two times a day (BID) | ORAL | Status: DC
  Filled 2018-03-04 (×3): qty 1

## 2018-03-04 MED ORDER — CHOLECALCIFEROL 1.25 MG (50000 UT) PO CAPS: 50000.0000 [IU] | ORAL_CAPSULE | ORAL | 1 refills | Status: AC

## 2018-03-04 MED ORDER — NYSTATIN 100000 UNIT/ML MT SUSP
1.0000 mL | Freq: Four times a day (QID) | OROMUCOSAL | Status: DC
  Administered 2018-03-04 – 2018-03-05 (×3): 100000 [IU] via ORAL
  Filled 2018-03-04 (×2): qty 5

## 2018-03-04 MED ORDER — NYSTATIN 100000 UNIT/ML MT SUSP
5.0000 mL | Freq: Four times a day (QID) | OROMUCOSAL | Status: DC
  Filled 2018-03-04 (×2): qty 5

## 2018-03-04 NOTE — Progress Notes (Signed)
[facility] [place] NAME: Cassie Jones    MR#:  [id number]  DATE OF BIRTH:  [birth year]  SUBJECTIVE:  patient came in with increasing weakness cough congestion found to have right middle lobe pneumonia. She feels much better today  REVIEW OF SYSTEMS:   Review of Systems  Constitutional: Negative for chills, fever and weight loss.  HENT: Negative for ear discharge, ear pain and nosebleeds.   Eyes: Negative for blurred vision, pain and discharge.  Respiratory: Positive for cough and shortness of breath. Negative for sputum production, wheezing and stridor.   Cardiovascular: Negative for chest pain, palpitations, orthopnea and PND.  Gastrointestinal: Negative for abdominal pain, diarrhea, nausea and vomiting.  Genitourinary: Negative for frequency and urgency.  Musculoskeletal: Negative for back pain and joint pain.  Neurological: Positive for weakness. Negative for sensory change, speech change and focal weakness.  Psychiatric/Behavioral: Negative for depression and hallucinations. The patient is not nervous/anxious.    Tolerating Diet:yes Tolerating PT: ambulatory at home, HHPT   DRUG ALLERGIES:   Allergies  Allergen Reactions  . Codeine Nausea And Vomiting    VITALS:  Blood pressure (!) 110/47, pulse 74, temperature 98.5 F (36.9 C), temperature source Oral, resp. rate 18, height '5\' 2"'$  (1.575 m), weight 58.7 kg (129 lb 6.4 oz), SpO2 92 %.  PHYSICAL EXAMINATION:   Physical Exam  GENERAL:  72 y.o.-year-old patient lying in the bed with no acute distress. Thin cachectic EYES: Pupils equal, round, reactive to light and accommodation. No scleral icterus. Extraocular muscles intact.  HEENT: Head atraumatic, normocephalic. Oropharynx and nasopharynx clear.  NECK:  Supple, no jugular venous distention. No thyroid enlargement, no tenderness.  LUNGS: distant breath sounds bilaterally, no wheezing, rales, rhonchi. No use of accessory  muscles of respiration.  CARDIOVASCULAR: S1, S2 normal. No murmurs, rubs, or gallops.  ABDOMEN: Soft, nontender, nondistended. Bowel sounds present. No organomegaly or mass.  EXTREMITIES: No cyanosis, clubbing or edema b/l.    NEUROLOGIC: Cranial nerves II through XII are intact. No focal Motor or sensory deficits b/l.   PSYCHIATRIC:  patient is alert and oriented x 3.  SKIN: No obvious rash, lesion, or ulcer.   LABORATORY PANEL:  CBC Recent Labs  Lab 03/03/18 0420  WBC 1.5*  HGB 9.7*  HCT 28.3*  PLT 119*    Chemistries  Recent Labs  Lab 03/02/18 1715 03/02/18 2221 03/03/18 0420  NA 134*  --  138  K 3.0*  --  3.6  CL 96*  --  108  CO2 27  --  25  GLUCOSE 129*  --  111*  BUN 14  --  11  CREATININE 0.83  --  0.81  CALCIUM 8.6*  --  8.0*  MG  --  1.6*  --   AST 20  --   --   ALT 16  --   --   ALKPHOS 55  --   --   BILITOT 1.3*  --   --    Cardiac Enzymes Recent Labs  Lab 03/02/18 1715  TROPONINI <0.03   RADIOLOGY:  Dg Chest 2 View  Result Date: 03/02/2018 CLINICAL DATA:  Cough for the past 5 days associated with chest congestion and shortness of breath. EXAM: CHEST - 2 VIEW COMPARISON:  Chest x-ray of Feb 21, 2018 FINDINGS: The lungs are well-expanded. The interstitial markings have become markedly more conspicuous bilaterally. An area of confluent density is noted at the left lung base. The heart and  pulmonary vascularity are normal. The mediastinum is normal in width. IMPRESSION: Widespread interstitial infiltrates worrisome for pneumonia much more conspicuous than on the previous study. There is an area of airspace opacity in the lingula that may also reflect pneumonia. No CHF. Followup PA and lateral chest X-ray is recommended in 3-4 weeks following trial of antibiotic therapy to ensure resolution and exclude underlying malignancy. Thoracic aortic atherosclerosis. Electronically Signed   By: David  Martinique M.D.   On: 03/02/2018 16:02   ASSESSMENT AND PLAN:  Cassie Jones  is a 72 y.o. female with a known history of multiple myeloma, status post chemotherapy 2 weeks ago, hypertension, hyperlipidemia, pleural effusion, asthma and ascites.  The patient was recently treated with antibiotics for UTI.  She started to have cough, sputum and the shortness of breath a few days ago.  1. 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 MRSA PCR negative -received IV fluids -blood culture negative -sats > 93% on RA  2. Pancytopenia suspected due to chemo for multiple myeloma -Dr. Gary Fleet input noted. Pt will hold off Pomalyst for now till she is seen by DR Tasia Catchings end of May -continue to monitor counts  3. Hypokalemia repeated with oral K  4. Generalized weakness deconditioning physical therapy to see  5. History of right upper extremity DVT--- based off an ultrasound on an end of April. Patient will continue her eliquis  6. Jerrye Bushy continue Protonix  PT recommends HHPT  Case discussed with Care Management/Social Worker. Management plans discussed with the patient, family and they are in agreement.  CODE STATUS: DNR  DVT Prophylaxis: eliquis  TOTAL TIME TAKING CARE OF THIS PATIENT: *25* minutes.  >50% time spent on counselling and coordination of care  POSSIBLE D/C IN *1-2 DAYS, DEPENDING ON CLINICAL CONDITION.  Note: This dictation was prepared with Dragon dictation along with smaller phrase technology. Any transcriptional errors that result from this process are unintentional.  Fritzi Mandes M.D on 03/04/2018 at 8:53 AM  Between 7am to 6pm - Pager - [phone]  After 6pm go to [email or web address] - password [email or web address] [facility]  Office  [phone]  CC: Primary care physician; McLean-Scocuzza, Nino Glow, MDPatient ID: Cassie Jones, female   DOB: [birth year], 72 y.o.   MRN: [id number]

## 2018-03-05 ENCOUNTER — Ambulatory Visit: Payer: Medicare PPO | Admitting: Radiation Oncology

## 2018-03-05 MED ORDER — LEVOFLOXACIN 750 MG PO TABS
750.0000 mg | ORAL_TABLET | Freq: Every day | ORAL | Status: DC
  Administered 2018-03-05: 12:00:00 750 mg via ORAL
  Filled 2018-03-05: qty 1

## 2018-03-05 MED ORDER — GUAIFENESIN 100 MG/5ML PO SOLN: 5.0000 mL | ORAL | 0 refills | Status: DC | PRN

## 2018-03-05 MED ORDER — LEVOFLOXACIN 500 MG PO TABS: 500.0000 mg | ORAL_TABLET | Freq: Every day | ORAL | Status: DC

## 2018-03-05 MED ORDER — ENSURE ENLIVE PO LIQD: 237.0000 mL | Freq: Three times a day (TID) | ORAL | 12 refills | Status: AC

## 2018-03-05 NOTE — Care Management (Signed)
Receiving nursing services from [facility] to home today per Dr. Mickeal Skinner from [facility] updated Shelbie Ammons RN MSN [facility] [phone]

## 2018-03-05 NOTE — Discharge Summary (Signed)
[facility] NAME: Cassie Jones    MR#:  [id number]  DATE OF BIRTH:  1946/02/05  DATE OF ADMISSION:  03/02/2018 ADMITTING PHYSICIAN: Demetrios Loll, MD  DATE OF DISCHARGE: 03/05/2018  PRIMARY CARE PHYSICIAN: McLean-Scocuzza, Nino Glow, MD    ADMISSION DIAGNOSIS:  Hypokalemia [E87.6] Chemotherapy-induced neutropenia (HCC) [D70.1, T45.1X5A] Sepsis, due to unspecified organism ([place]) [A41.9] Multifocal pneumonia [J18.9]  DISCHARGE DIAGNOSIS:  Right side Pneumonia  SECONDARY DIAGNOSIS:   Past Medical History:  Diagnosis Date  . Ascites   . Asthma   . Bilateral leg edema   . Cancer ([place])   . Chicken pox   . Colon polyps   . Diverticulitis    with perforation 02/2017 and hosp with colostomy bag s/p removal   . GERD (gastroesophageal reflux disease)   . Hyperlipidemia   . Hypertension   . Multiple myeloma ([place])    dx'ed 01/2018 follows Dr. Tasia Catchings and [facility]   . Pleural effusion   . Thyroid nodule   . UTI (urinary tract infection)   . Vitamin D deficiency     HOSPITAL COURSE:   Cassie Jones a71 y.o.femalewith a known history of multiple myeloma, status post chemotherapy 2 weeks ago,hypertension, hyperlipidemia, pleural effusion, asthma and ascites. The patient was recently treated with antibiotics for UTI. She started to have cough, sputum and the shortness of breath a few days ago.  1. 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 MRSA PCR negative -received IV fluids -blood culture negative -she does not use any chronic home oxygen. Sats remain 92% and above on RA  2. Pancytopenia suspected due to chemo for multiple myeloma -Dr. Gary Fleet input she did. Recommends hold oral chemo and follow-up with Dr. Tasia Catchings as outpatient on her scheduled appointment  3. Hypokalemia repleted with oral K  4. Generalized  weakness deconditioning -appreciate PT input. Home health PT arranged.  5. History of right upper extremity DVT--- based off an ultrasound on an end of April. - Patient will continue her eliquis  6. Jerrye Bushy continue Protonix  discussed with family. Patient will discharged home with home health   CONSULTS OBTAINED:  Treatment Team:  Lloyd Huger, MD  DRUG ALLERGIES:   Allergies  Allergen Reactions  . Codeine Nausea And Vomiting    DISCHARGE MEDICATIONS:   Allergies as of 03/05/2018      Reactions   Codeine Nausea And Vomiting      Medication List    STOP taking these medications   POMALYST 4 MG capsule Generic drug:  pomalidomide   spironolactone 25 MG tablet Commonly known as:  ALDACTONE     TAKE these medications   acetaminophen 500 MG tablet Commonly known as:  TYLENOL Take 1,000 mg by mouth every 6 (six) hours as needed for mild pain or fever.   albuterol 108 (90 Base) MCG/ACT inhaler Commonly known as:  VENTOLIN HFA Inhale 2 puffs into the lungs every 6 (six) hours as needed.   apixaban 5 MG Tabs tablet Commonly known as:  ELIQUIS Take 2 tablets (72m) twice a day for 7 days. Take 1 tablet (548m twice daily after first week.   benzonatate 200 MG capsule Commonly known as:  TESSALON Take 1 capsule (200 mg total) by mouth 3 (three) times daily as needed for cough.   Cholecalciferol 50000 units capsule Take 1 capsule (50,000 Units total) by mouth  once a week.   feeding supplement (ENSURE ENLIVE) Liqd Take 237 mLs by mouth 3 (three) times daily between meals.   fluticasone 50 MCG/ACT nasal spray Commonly known as:  FLONASE Place 1-2 sprays into both nostrils daily.   fluticasone 50 MCG/BLIST diskus inhaler Commonly known as:  FLOVENT DISKUS Inhale 1 puff into the lungs 2 (two) times daily.   furosemide 20 MG tablet Commonly known as:  LASIX Take 1 tablet (20 mg total) by mouth daily as needed. What changed:  reasons to take this    guaiFENesin 100 MG/5ML Soln Commonly known as:  ROBITUSSIN Take 5 mLs (100 mg total) by mouth every 4 (four) hours as needed for cough or to loosen phlegm.   levofloxacin 750 MG tablet Commonly known as:  LEVAQUIN Take 1 tablet (750 mg total) by mouth every other day.   loperamide 2 MG tablet Commonly known as:  IMODIUM A-D Take 2 mg by mouth 4 (four) times daily as needed for diarrhea or loose stools.   montelukast 10 MG tablet Commonly known as:  SINGULAIR Take 10 mg by mouth at bedtime.   ondansetron 4 MG disintegrating tablet Commonly known as:  ZOFRAN-ODT Take 4 mg by mouth every 12 (twelve) hours as needed for nausea.   ondansetron 8 MG tablet Commonly known as:  ZOFRAN Take 8 mg by mouth every 8 (eight) hours as needed for nausea.   oxyCODONE 5 MG immediate release tablet Commonly known as:  Oxy IR/ROXICODONE Take 5 mg by mouth 3 (three) times daily as needed for moderate pain.   oxyCODONE 10 mg 12 hr tablet Commonly known as:  OXYCONTIN Take 10 mg by mouth every 12 (twelve) hours as needed (severe pain).   pantoprazole 40 MG tablet Commonly known as:  PROTONIX Take 1 tablet by mouth every morning.   pravastatin 10 MG tablet Commonly known as:  PRAVACHOL Take 1 tablet by mouth at bedtime.   prochlorperazine 10 MG tablet Commonly known as:  COMPAZINE Take 10 mg by mouth every 6 (six) hours as needed for nausea.       If you experience worsening of your admission symptoms, develop shortness of breath, life threatening emergency, suicidal or homicidal thoughts you must seek medical attention immediately by calling 911 or calling your MD immediately  if symptoms less severe.  You Must read complete instructions/literature along with all the possible adverse reactions/side effects for all the Medicines you take and that have been prescribed to you. Take any new Medicines after you have completely understood and accept all the possible adverse reactions/side  effects.   Please note  You were cared for by a hospitalist during your hospital stay. If you have any questions about your discharge medications or the care you received while you were in the hospital after you are discharged, you can call the unit and asked to speak with the hospitalist on call if the hospitalist that took care of you is not available. Once you are discharged, your primary care physician will handle any further medical issues. Please note that NO REFILLS for any discharge medications will be authorized once you are discharged, as it is imperative that you return to your primary care physician (or establish a relationship with a primary care physician if you do not have one) for your aftercare needs so that they can reassess your need for medications and monitor your lab values. Today   SUBJECTIVE   Feels better  VITAL SIGNS:  Blood pressure (!) 118/55,  pulse 64, temperature 98.6 F (37 C), temperature source Oral, resp. rate 18, height _0  (1.575 m), weight 58.7 kg (129 lb 6.4 oz), SpO2 92 %.  I/O:    Intake/Output Summary (Last 24 hours) at 03/05/2018 1208 Last data filed at 03/05/2018 0736 Gross per 24 hour  Intake -  Output 750 ml  Net -750 ml    PHYSICAL EXAMINATION:  GENERAL:  72 y.o.-year-old patient lying in the bed with no acute distress. thin EYES: Pupils equal, round, reactive to light and accommodation. No scleral icterus. Extraocular muscles intact.  HEENT: Head atraumatic, normocephalic. Oropharynx and nasopharynx clear.  NECK:  Supple, no jugular venous distention. No thyroid enlargement, no tenderness.  LUNGS: Normal breath sounds bilaterally, no wheezing, rales,rhonchi or crepitation. No use of accessory muscles of respiration. emphysematus chest CARDIOVASCULAR: S1, S2 normal. No murmurs, rubs, or gallops.  ABDOMEN: Soft, non-tender, non-distended. Bowel sounds present. No organomegaly or mass.  EXTREMITIES: No pedal edema, cyanosis, or clubbing.   NEUROLOGIC: Cranial nerves II through XII are intact. Muscle strength 5/5 in all extremities. Sensation intact. Gait not checked.  PSYCHIATRIC: The patient is alert and oriented x 3.  SKIN: No obvious rash, lesion, or ulcer.   DATA REVIEW:   CBC  Recent Labs  Lab 03/03/18 0420  WBC 1.5*  HGB 9.7*  HCT 28.3*  PLT 119*    Chemistries  Recent Labs  Lab 03/02/18 1715 03/02/18 2221 03/03/18 0420  NA 134*  --  138  K 3.0*  --  3.6  CL 96*  --  108  CO2 27  --  25  GLUCOSE 129*  --  111*  BUN 14  --  11  CREATININE 0.83  --  0.81  CALCIUM 8.6*  --  8.0*  MG  --  1.6*  --   AST 20  --   --   ALT 16  --   --   ALKPHOS 55  --   --   BILITOT 1.3*  --   --     Microbiology Results   Recent Results (from the past 240 hour(s))  Blood Culture (routine x 2)     Status: None (Preliminary result)   Collection Time: 03/02/18  7:27 PM  Result Value Ref Range Status   Specimen Description BLOOD L AC  Final   Special Requests   Final    BOTTLES DRAWN AEROBIC AND ANAEROBIC Blood Culture adequate volume   Culture   Final    NO GROWTH 3 DAYS Performed at [facility], [address] [place], [place] [address]    Report Status PENDING  Incomplete  Blood Culture (routine x 2)     Status: None (Preliminary result)   Collection Time: 03/02/18  7:27 PM  Result Value Ref Range Status   Specimen Description BLOOD L H  Final   Special Requests   Final    BOTTLES DRAWN AEROBIC AND ANAEROBIC Blood Culture results may not be optimal due to an inadequate volume of blood received in culture bottles   Culture   Final    NO GROWTH 3 DAYS Performed at [facility], [address] [place], [place] [address]    Report Status PENDING  Incomplete  MRSA PCR Screening     Status: None   Collection Time: 03/02/18 10:22 PM  Result Value Ref Range Status   MRSA by PCR NEGATIVE NEGATIVE Final    Comment:        The GeneXpert MRSA  Assay (FDA approved for NASAL  specimens only), is one component of a comprehensive MRSA colonization surveillance program. It is not intended to diagnose MRSA infection nor to guide or monitor treatment for MRSA infections. Performed at [facility], [address] [place], [place] [address]     RADIOLOGY:  No results found.   Management plans discussed with the patient, family and they are in agreement.  CODE STATUS:     Code Status Orders  (From admission, onward)        Start     Ordered   03/02/18 2155  Do not attempt resuscitation (DNR)  Continuous    Question Answer Comment  In the event of cardiac or respiratory ARREST Do not call a "code blue"   In the event of cardiac or respiratory ARREST Do not perform Intubation, CPR, defibrillation or ACLS   In the event of cardiac or respiratory ARREST Use medication by any route, position, wound care, and other measures to relive pain and suffering. May use oxygen, suction and manual treatment of airway obstruction as needed for comfort.      03/02/18 2154    Code Status History    Date Active Date Inactive Code Status Order ID Comments User Context   02/22/2018 1418 02/25/2018 1921 DNR [id number]  Demetrios Loll, MD Inpatient   02/21/2018 2344 02/22/2018 1418 Full Code [id number]  Lance Coon, MD ED    Advance Directive Documentation     Most Recent Value  Type of Advance Directive  Out of facility DNR (pink MOST or yellow form)  Pre-existing out of facility DNR order (yellow form or pink MOST form)  -  "MOST" Form in Place?  -      TOTAL TIME TAKING CARE OF THIS PATIENT: *40* minutes.    Fritzi Mandes M.D on 03/05/2018 at 12:08 PM  Between 7am to 6pm - Pager - [phone] After 6pm go to [email or web address] - password [email or web address] [facility]  Office  [phone]  CC: Primary care physician; McLean-Scocuzza, Nino Glow, MD

## 2018-03-05 NOTE — Progress Notes (Signed)
Patient discharged home per MD order. All discharge instructions given and all questions answered. 

## 2018-03-05 NOTE — Care Management Important Message (Signed)
Important Message  Patient Details  Name: Cassie Jones MRN: [id number] Date of Birth: May 22, 1946   Medicare Important Message Given:  Yes    Cassie Jones 03/05/2018, 10:43 AM

## 2018-03-07 LAB — CULTURE, BLOOD (ROUTINE X 2)
Culture: NO GROWTH
Culture: NO GROWTH
Special Requests: ADEQUATE

## 2018-03-08 ENCOUNTER — Ambulatory Visit: Payer: Medicare PPO

## 2018-03-08 ENCOUNTER — Ambulatory Visit: Payer: Medicare PPO | Admitting: Oncology

## 2018-03-08 ENCOUNTER — Other Ambulatory Visit: Payer: Medicare PPO

## 2018-03-08 ENCOUNTER — Inpatient Hospital Stay: Payer: Medicare PPO | Admitting: Oncology

## 2018-03-08 ENCOUNTER — Telehealth: Payer: Self-pay | Admitting: Internal Medicine

## 2018-03-08 NOTE — Telephone Encounter (Signed)
Ok   Thanks TMS 

## 2018-03-08 NOTE — Telephone Encounter (Signed)
Copied from [place] [id number]. Topic: Quick Communication - See Telephone Encounter >> Mar 08, 2018  1:56 PM Conception Chancy, NT wrote: CRM for notification. See Telephone encounter for: 03/08/18.  Vinnie Level is a Therapist, sports with [facility] and would like a verbal order for nursing visits,1 week 1, 2 week 2, 1 week 3, and 2 as needed.   Vinnie Level Cb# [phone]

## 2018-03-08 NOTE — Telephone Encounter (Signed)
Ok to give

## 2018-03-09 ENCOUNTER — Telehealth: Payer: Self-pay | Admitting: Internal Medicine

## 2018-03-09 ENCOUNTER — Inpatient Hospital Stay: Payer: Medicare PPO

## 2018-03-09 ENCOUNTER — Inpatient Hospital Stay: Payer: Medicare PPO | Attending: Oncology

## 2018-03-09 ENCOUNTER — Encounter: Payer: Self-pay | Admitting: Oncology

## 2018-03-09 ENCOUNTER — Other Ambulatory Visit: Payer: Self-pay

## 2018-03-09 ENCOUNTER — Inpatient Hospital Stay (HOSPITAL_BASED_OUTPATIENT_CLINIC_OR_DEPARTMENT_OTHER): Payer: Medicare PPO | Admitting: Oncology

## 2018-03-09 VITALS — BP 100/61 | HR 75 | Temp 98.8°F | Resp 18 | Ht 62.0 in | Wt 125.7 lb

## 2018-03-09 DIAGNOSIS — R5383 Other fatigue: Secondary | ICD-10-CM

## 2018-03-09 DIAGNOSIS — C9002 Multiple myeloma in relapse: Secondary | ICD-10-CM | POA: Diagnosis not present

## 2018-03-09 DIAGNOSIS — R05 Cough: Secondary | ICD-10-CM | POA: Diagnosis not present

## 2018-03-09 DIAGNOSIS — J449 Chronic obstructive pulmonary disease, unspecified: Secondary | ICD-10-CM | POA: Insufficient documentation

## 2018-03-09 DIAGNOSIS — T451X5S Adverse effect of antineoplastic and immunosuppressive drugs, sequela: Secondary | ICD-10-CM | POA: Insufficient documentation

## 2018-03-09 DIAGNOSIS — R5381 Other malaise: Secondary | ICD-10-CM

## 2018-03-09 DIAGNOSIS — C7951 Secondary malignant neoplasm of bone: Secondary | ICD-10-CM | POA: Insufficient documentation

## 2018-03-09 DIAGNOSIS — J441 Chronic obstructive pulmonary disease with (acute) exacerbation: Secondary | ICD-10-CM | POA: Diagnosis not present

## 2018-03-09 DIAGNOSIS — E785 Hyperlipidemia, unspecified: Secondary | ICD-10-CM | POA: Insufficient documentation

## 2018-03-09 DIAGNOSIS — Z7901 Long term (current) use of anticoagulants: Secondary | ICD-10-CM | POA: Diagnosis not present

## 2018-03-09 DIAGNOSIS — I82621 Acute embolism and thrombosis of deep veins of right upper extremity: Secondary | ICD-10-CM | POA: Diagnosis not present

## 2018-03-09 DIAGNOSIS — M549 Dorsalgia, unspecified: Secondary | ICD-10-CM | POA: Insufficient documentation

## 2018-03-09 DIAGNOSIS — Z79899 Other long term (current) drug therapy: Secondary | ICD-10-CM | POA: Insufficient documentation

## 2018-03-09 DIAGNOSIS — D702 Other drug-induced agranulocytosis: Secondary | ICD-10-CM

## 2018-03-09 DIAGNOSIS — Z5112 Encounter for antineoplastic immunotherapy: Secondary | ICD-10-CM | POA: Insufficient documentation

## 2018-03-09 DIAGNOSIS — Z923 Personal history of irradiation: Secondary | ICD-10-CM | POA: Insufficient documentation

## 2018-03-09 DIAGNOSIS — Z87440 Personal history of urinary (tract) infections: Secondary | ICD-10-CM | POA: Diagnosis not present

## 2018-03-09 DIAGNOSIS — K219 Gastro-esophageal reflux disease without esophagitis: Secondary | ICD-10-CM | POA: Insufficient documentation

## 2018-03-09 DIAGNOSIS — Z5111 Encounter for antineoplastic chemotherapy: Secondary | ICD-10-CM

## 2018-03-09 DIAGNOSIS — Z8701 Personal history of pneumonia (recurrent): Secondary | ICD-10-CM | POA: Insufficient documentation

## 2018-03-09 DIAGNOSIS — R630 Anorexia: Secondary | ICD-10-CM | POA: Insufficient documentation

## 2018-03-09 DIAGNOSIS — I10 Essential (primary) hypertension: Secondary | ICD-10-CM | POA: Insufficient documentation

## 2018-03-09 DIAGNOSIS — Z87891 Personal history of nicotine dependence: Secondary | ICD-10-CM | POA: Insufficient documentation

## 2018-03-09 DIAGNOSIS — E876 Hypokalemia: Secondary | ICD-10-CM | POA: Insufficient documentation

## 2018-03-09 HISTORY — DX: Other drug-induced agranulocytosis: D70.2

## 2018-03-09 LAB — COMPREHENSIVE METABOLIC PANEL
ALK PHOS: 57 U/L (ref 38–126)
ALT: 12 U/L — ABNORMAL LOW (ref 14–54)
ANION GAP: 13 (ref 5–15)
AST: 17 U/L (ref 15–41)
Albumin: 2.6 g/dL — ABNORMAL LOW (ref 3.5–5.0)
BUN: 11 mg/dL (ref 6–20)
CALCIUM: 9.1 mg/dL (ref 8.9–10.3)
CO2: 26 mmol/L (ref 22–32)
Chloride: 101 mmol/L (ref 101–111)
Creatinine, Ser: 0.74 mg/dL (ref 0.44–1.00)
GFR calc non Af Amer: >60 mL/min (ref >60)
Glucose, Bld: 107 mg/dL — ABNORMAL HIGH (ref 65–99)
POTASSIUM: 3.8 mmol/L (ref 3.5–5.1)
SODIUM: 140 mmol/L (ref 135–145)
Total Bilirubin: 1 mg/dL (ref 0.3–1.2)
Total Protein: 6.1 g/dL — ABNORMAL LOW (ref 6.5–8.1)

## 2018-03-09 LAB — CBC WITH DIFFERENTIAL/PLATELET
BASOS ABS: 0 10*3/uL (ref 0–0.1)
BASOS PCT: 1 %
Eosinophils Absolute: 0.3 10*3/uL (ref 0–0.7)
Eosinophils Relative: 14 %
HEMATOCRIT: 31.5 % — AB (ref 35.0–47.0)
HEMOGLOBIN: 10.8 g/dL — AB (ref 12.0–16.0)
Lymphocytes Relative: 30 %
Lymphs Abs: 0.8 10*3/uL — ABNORMAL LOW (ref 1.0–3.6)
MCH: 29.7 pg (ref 26.0–34.0)
MCHC: 34.2 g/dL (ref 32.0–36.0)
MCV: 87 fL (ref 80.0–100.0)
MONOS PCT: 34 %
Monocytes Absolute: 0.9 10*3/uL (ref 0.2–0.9)
NEUTROS ABS: 0.5 10*3/uL — AB (ref 1.4–6.5)
NEUTROS PCT: 21 %
Platelets: 371 10*3/uL (ref 150–440)
RBC: 3.62 MIL/uL — ABNORMAL LOW (ref 3.80–5.20)
RDW: 15.8 % — ABNORMAL HIGH (ref 11.5–14.5)
WBC: 2.6 10*3/uL — ABNORMAL LOW (ref 3.6–11.0)

## 2018-03-09 MED ORDER — DIPHENHYDRAMINE HCL 25 MG PO CAPS
50.0000 mg | ORAL_CAPSULE | Freq: Once | ORAL | Status: AC
Start: 2018-03-09 — End: 2018-03-09
  Administered 2018-03-09: 50 mg via ORAL
  Filled 2018-03-09: qty 2

## 2018-03-09 MED ORDER — TBO-FILGRASTIM 300 MCG/0.5ML ~~LOC~~ SOSY: 300.0000 ug | PREFILLED_SYRINGE | Freq: Every day | SUBCUTANEOUS | Status: DC

## 2018-03-09 MED ORDER — SODIUM CHLORIDE 0.9 % IV SOLN
900.0000 mg | Freq: Once | INTRAVENOUS | Status: AC
  Administered 2018-03-09: 900 mg via INTRAVENOUS
  Filled 2018-03-09: qty 40

## 2018-03-09 MED ORDER — SODIUM CHLORIDE 0.9 % IV SOLN
20.0000 mg | Freq: Once | INTRAVENOUS | Status: AC
  Administered 2018-03-09: 20 mg via INTRAVENOUS
  Filled 2018-03-09: qty 2

## 2018-03-09 MED ORDER — PROCHLORPERAZINE MALEATE 10 MG PO TABS
10.0000 mg | ORAL_TABLET | Freq: Once | ORAL | Status: AC
  Administered 2018-03-09: 10 mg via ORAL
  Filled 2018-03-09: qty 1

## 2018-03-09 MED ORDER — SODIUM CHLORIDE 0.9 % IV SOLN
Freq: Once | INTRAVENOUS | Status: AC
  Administered 2018-03-09: 10:00:00 via INTRAVENOUS
  Filled 2018-03-09: qty 1000

## 2018-03-09 MED ORDER — ACETAMINOPHEN 325 MG PO TABS
650.0000 mg | ORAL_TABLET | Freq: Once | ORAL | Status: AC
  Administered 2018-03-09: 650 mg via ORAL
  Filled 2018-03-09: qty 2

## 2018-03-09 NOTE — Telephone Encounter (Signed)
Ok to give

## 2018-03-09 NOTE — Addendum Note (Signed)
Addended by: Earlie Server on: 03/09/2018 04:43 PM   Modules accepted: Orders

## 2018-03-09 NOTE — Progress Notes (Signed)
Hematology/Oncology Follow up note [facility] [phone] [phone]   Patient Care Team: McLean-Scocuzza, Nino Glow, MD as PCP - General (Internal Medicine)  REFERRING PROVIDER: Dr. Baruch Gouty, Donette  REASON FOR VISIT Follow up for treatment of MM  HISTORY OF PRESENTING ILLNESS:  Cassie Jones is a  72 y.o.  female with PMH listed below who was referred to me for evaluation of multiple myeloma.  Patient used to follow-up with local oncologist Dr. Tonia Brooms and [facility] oncologist Dr. Amalia Hailey.  Patient tells me that as her husband works every day and has a busy working schedule, she is not able to get transportation to her treatment.  As an alternative she is now living with her brother who is close to [facility] [place] and want to transfer care here. Extensive medical record review was performed.    Patient was diagnosed with IgG lambda, ISS2, t (4,14) multiple myeloma on February 03, 2017, she has hemoglobin of 9, calcium 8.9, LDH 132, beta microglobulin 4.6, albumin 3.2.  Skeletal survey negative.  PET scan with single nonspecific humeral lesion and a thyroid nodule.  Marrow with 60% plasmacytosis by CD138 IHC, FISH with hyperdiploidy t(4, 14), 1 q. gain.. Per note, she was treated with CyborD (M spike 3.6 at start)  from 01/2017 to 02/2017. Her treatment was switched to Revlimid Velcade dexamethasone from 02/2017, M spike 1.2 at start,and was found to have progression of disease in 09/2017, when her M spike increased to 1.8, and developed compression fracture in 10/2016. She was restarted on RVD using weekly Velcade.  She was recently seen by Dr. Boris Sharper and recommended to start on a combination of daratumumab, Pomalyst, dexamethasone. Per patient she received her first daratumumab last week with split dose '8mg'$ /kg given on 3/14 and 3/15.  Patient reports that she tolerate the treatment well except mild infusion reaction.  # Patient  also reports that she has got dental clearance and has been given 1 dose of Zometa by Dr. Remus Loffler (unkown dates).  Autologous transplant has been discussed with patient at [facility] and patient has not decided.  # Patient was seen by nurse practitioner last week prior to her cycle 2 daratumumab.  She reports feeling shortness of breath, having cough, believed to have bronchitis and was prescribed Levaquin 500 mg p.o. daily for 7 days, and albuterol as needed.  She also had a VQ scan low probability for pulmonary embolism.    INTERVAL HISTORY Cassie Jones is a 72 y.o. female who has above history reviewed by me today presents for follow up visit for management of multiple myeloma. She was recently hospitalized twice for UTI and pneumonia.   Current Treatment  # 3/14, 3/15 at outside facility, she got split dose for first treatment of daratumumab. #  01/08/2018 Start on weekly daratumumab '16mg'$ /m2, dexamethasone '20mg'$ ,  S/p # Palliative RT to spine. 01/08/2018 Cycle 1 Pomalyst '4mg'$  Day 1-21 ogf 28 day cycle.   # 01/29/2018 5th weekly treatment of Daratumumab,  Cycle 2 Pomalyst was started for a week and was nterrupted due to acute bronchitis treated with outpatient Azithromycin.    # 02/16/2018 developed right upper extremity swelling and tenderness, confirmed to be DVT. She was taking Aspirin.  Started on Eliquis. Aspirin is stopped.  # 02/16/2018 6th Daratumumab treatment. Was started on Cycle 3 Pomalyst, she has completed about 2 weeks treatment. She was admitted to hospital on 02/21/2018 for Klebsiella UTI and oncology was not consulted. She was treatment  with antibiotics and continued on Pomalyst. She was hospitalized again on 03/02/2018 for pneumonia, COPD exacerbation/emphasema and oncology was consulted and patient was seen by my colleague Dr.Finnergan and advise patient to stop Pomalyst.  Patient reports having fatigue, lack of appetite.  Denies any fever chills.  Cough has get better.  On  discharge, she was given a course of antibiotics with Levaquin 750 mg to be taken every other day for total of 10 days course, which she is going to finish on 03/13/2018. Appetite is poor.  Her weight remains the same compared to 1 week ago when she was discharged from hospital, however there was a decrease of 8 pounds since she was last seen by me in the office at the end of April. Back pain is getting better after radiation and controlled well with pain medication.  Current Pain medication: Oxycodone '5mg'$  Q8h as needed. Oxycontin '10mg'$  Q12h.   Review of Systems  Constitutional: Positive for malaise/fatigue. Negative for chills, fever and weight loss.  HENT: Negative for congestion, ear discharge, ear pain, hearing loss, nosebleeds, sinus pain and sore throat.   Eyes: Negative for blurred vision, double vision, photophobia, pain, discharge and redness.  Respiratory: Positive for cough. Negative for hemoptysis, sputum production, shortness of breath and wheezing.   Cardiovascular: Negative for chest pain, palpitations, orthopnea, claudication and leg swelling.  Gastrointestinal: Negative for abdominal pain, blood in stool, constipation, diarrhea, heartburn, melena, nausea and vomiting.  Genitourinary: Negative for dysuria, flank pain, frequency, hematuria and urgency.  Musculoskeletal: Negative for back pain, joint pain, myalgias and neck pain.  Skin: Negative for itching and rash.  Neurological: Negative for dizziness, tingling, tremors, sensory change, speech change, focal weakness, weakness and headaches.  Endo/Heme/Allergies: Negative for environmental allergies. Does not bruise/bleed easily.  Psychiatric/Behavioral: Negative for depression, hallucinations, substance abuse and suicidal ideas. The patient is not nervous/anxious.     MEDICAL HISTORY:  Past Medical History:  Diagnosis Date  . Ascites   . Asthma   . Bilateral leg edema   . Cancer ([place])   . Chicken pox   . Colon polyps   .  Diverticulitis    with perforation 02/2017 and hosp with colostomy bag s/p removal   . GERD (gastroesophageal reflux disease)   . Hyperlipidemia   . Hypertension   . Multiple myeloma ([place])    dx'ed 01/2018 follows Dr. Tasia Catchings and [facility] H/O   . Pleural effusion   . Thyroid nodule   . UTI (urinary tract infection)   . Vitamin D deficiency     SURGICAL HISTORY: Past Surgical History:  Procedure Laterality Date  . ABDOMINAL HYSTERECTOMY    . BREAST BIOPSY Bilateral yrs ago   benign  . CHOLECYSTECTOMY    . COLOSTOMY  2018  . COLOSTOMY REVERSAL     11 2018  . KYPHOPLASTY     11/2017 [place]     SOCIAL HISTORY: Social History   Socioeconomic History  . Marital status: Married    Spouse name: Not on file  . Number of children: Not on file  . Years of education: Not on file  . Highest education level: Not on file  Occupational History  . Not on file  Social Needs  . Financial resource strain: Not hard at all  . Food insecurity:    Worry: Never true    Inability: Never true  . Transportation needs:    Medical: No    Non-medical: No  Tobacco Use  . Smoking status: Former Research scientist (life sciences)  .  Smokeless tobacco: Never Used  Substance and Sexual Activity  . Alcohol use: Not Currently  . Drug use: Never  . Sexual activity: Yes    Comment: husband   Lifestyle  . Physical activity:    Days per week: 0 days    Minutes per session: 0 min  . Stress: Not at all  Relationships  . Social connections:    Talks on phone: More than three times a week    Gets together: Once a week    Attends religious service: 1 to 4 times per year    Active member of club or organization: No    Attends meetings of clubs or organizations: Never    Relationship status: Married  . Intimate partner violence:    Fear of current or ex partner: No    Emotionally abused: No    Physically abused: No    Forced sexual activity: No  Other Topics Concern  . Not on file  Social History Narrative   Married     Husband lives in [place] while she gets treatment in this area    She lives alone in this area with family close by     FAMILY HISTORY: Family History  Problem Relation Age of Onset  . Breast cancer Mother 35  . Cancer Mother        breast   . Diabetes Mother   . Heart disease Mother   . Hyperlipidemia Mother   . Hypertension Mother   . Birth defects Brother   . Diabetes Brother   . Heart disease Brother   . Hyperlipidemia Brother   . Cancer Father        prostate met to liver     ALLERGIES:  is allergic to codeine.  MEDICATIONS:  Current Outpatient Medications  Medication Sig Dispense Refill  . acetaminophen (TYLENOL) 500 MG tablet Take 1,000 mg by mouth every 6 (six) hours as needed for mild pain or fever.    Marland Kitchen albuterol (VENTOLIN HFA) 108 (90 Base) MCG/ACT inhaler Inhale 2 puffs into the lungs every 6 (six) hours as needed. 1 Inhaler 1  . apixaban (ELIQUIS) 5 MG TABS tablet Take 2 tablets ('10mg'$ ) twice a day for 7 days. Take 1 tablet ('5mg'$ ) twice daily after first week. 70 tablet 0  . benzonatate (TESSALON) 200 MG capsule Take 1 capsule (200 mg total) by mouth 3 (three) times daily as needed for cough. 30 capsule 0  . Cholecalciferol 50000 units capsule Take 1 capsule (50,000 Units total) by mouth once a week. 13 capsule 1  . feeding supplement, ENSURE ENLIVE, (ENSURE ENLIVE) LIQD Take 237 mLs by mouth 3 (three) times daily between meals. 237 mL 12  . fluticasone (FLONASE) 50 MCG/ACT nasal spray Place 1-2 sprays into both nostrils daily. 16 g 2  . fluticasone (FLOVENT DISKUS) 50 MCG/BLIST diskus inhaler Inhale 1 puff into the lungs 2 (two) times daily. 1 Inhaler 3  . furosemide (LASIX) 20 MG tablet Take 1 tablet (20 mg total) by mouth daily as needed. (Patient taking differently: Take 20 mg by mouth daily as needed for edema. ) 30 tablet 2  . guaiFENesin (ROBITUSSIN) 100 MG/5ML SOLN Take 5 mLs (100 mg total) by mouth every 4 (four) hours as needed for cough or to loosen  phlegm. 1200 mL 0  . levofloxacin (LEVAQUIN) 750 MG tablet Take 1 tablet (750 mg total) by mouth every other day. 5 tablet 0  . loperamide (IMODIUM A-D) 2 MG tablet Take 2  mg by mouth 4 (four) times daily as needed for diarrhea or loose stools.    . montelukast (SINGULAIR) 10 MG tablet Take 10 mg by mouth at bedtime.    . ondansetron (ZOFRAN) 8 MG tablet Take 8 mg by mouth every 8 (eight) hours as needed for nausea.    . ondansetron (ZOFRAN-ODT) 4 MG disintegrating tablet Take 4 mg by mouth every 12 (twelve) hours as needed for nausea.    Marland Kitchen oxyCODONE (OXY IR/ROXICODONE) 5 MG immediate release tablet Take 5 mg by mouth 3 (three) times daily as needed for moderate pain.    Marland Kitchen oxyCODONE (OXYCONTIN) 10 mg 12 hr tablet Take 10 mg by mouth every 12 (twelve) hours as needed (severe pain).    . pantoprazole (PROTONIX) 40 MG tablet Take 1 tablet by mouth every morning.    . pravastatin (PRAVACHOL) 10 MG tablet Take 1 tablet by mouth at bedtime.    . prochlorperazine (COMPAZINE) 10 MG tablet Take 10 mg by mouth every 6 (six) hours as needed for nausea.     No current facility-administered medications for this visit.      PHYSICAL EXAMINATION: ECOG PERFORMANCE STATUS: 1 - Symptomatic but completely ambulatory Vitals:   03/09/18 0836  BP: 100/61  Pulse: 75  Resp: 18  Temp: 98.8 F (37.1 C)  SpO2: 98%   Filed Weights   03/09/18 0836  Weight: 125 lb 11.2 oz (57 kg)    Physical Exam  Constitutional: She is oriented to person, place, and time and well-developed, well-nourished, and in no distress. No distress.  HENT:  Head: Normocephalic and atraumatic.  Nose: Nose normal.  Mouth/Throat: Oropharynx is clear and moist. No oropharyngeal exudate.  Eyes: Pupils are equal, round, and reactive to light. EOM are normal. Left eye exhibits no discharge. No scleral icterus.  Neck: Normal range of motion. Neck supple. No JVD present. No tracheal deviation present.  Cardiovascular: Normal rate, regular  rhythm and normal heart sounds.  No murmur heard. Pulmonary/Chest: Effort normal and breath sounds normal. No respiratory distress. She has no wheezes. She has no rales. She exhibits no tenderness.  Decreased breath sounds bilaterally.   Abdominal: Soft. She exhibits no distension and no mass. There is no tenderness. There is no rebound and no guarding.  Musculoskeletal: Normal range of motion. She exhibits no edema or tenderness.  Lymphadenopathy:    She has no cervical adenopathy.  Neurological: She is alert and oriented to person, place, and time. No cranial nerve deficit. She exhibits normal muscle tone. Coordination normal.  Skin: Skin is warm and dry. No rash noted. She is not diaphoretic. No erythema. No pallor.  Psychiatric: Memory, affect and judgment normal.     LABORATORY DATA:  I have reviewed the data as listed Lab Results  Component Value Date   WBC 2.6 (L) 03/09/2018   HGB 10.8 (L) 03/09/2018   HCT 31.5 (L) 03/09/2018   MCV 87.0 03/09/2018   PLT 371 03/09/2018   Recent Labs    02/16/18 0810 02/21/18 2006  02/25/18 0802 03/02/18 1033 03/02/18 1715 03/03/18 0420  NA 138 136   < > 127* 138 134* 138  K 3.7 3.2*   < > 3.5 3.0* 3.0* 3.6  CL 101 103   < > 96* 97 96* 108  CO2 27 25   < > 21* '29 27 25  '$ GLUCOSE 109* 117*   < > 108* 119* 129* 111*  BUN 14 10   < > '8 12 14 '$ 11  CREATININE 0.71 0.71   < > 0.67 0.83 0.83 0.81  CALCIUM 9.2 8.9   < > 7.9* 9.0 8.6* 8.0*  GFRNONAA >60 >60   < > >60  --  >60 >60  GFRAA >60 >60   < > >60  --  >60 >60  PROT 6.5 6.6  --   --   --  6.5  --   ALBUMIN 3.3* 3.1*  --   --   --  2.9*  --   AST 19 15  --   --   --  20  --   ALT 15 14  --   --   --  16  --   ALKPHOS 75 63  --   --   --  55  --   BILITOT 1.6* 1.3*  --   --   --  1.3*  --    < > = values in this interval not displayed.       ASSESSMENT & PLAN:  1. Multiple myeloma in relapse ([place])   2. Bone metastasis ([place])   3. Encounter for antineoplastic chemotherapy   4.  COPD with acute exacerbation ([place])   5. Acute deep vein thrombosis (DVT) of ulnar vein of right upper extremity ([facility])    #Multiple myeloma : Proceed with 7th weekly daratumumab.  Continue hold Pomalyst due to neutropenia. She appears to have multiple infection secondary to her immunocompromised state.  Pomalyst can be reduced to lower dose 2-'3mg'$  daily in the future, however patient got concerned and not willing to try again. For now, given her current performance status, and recent hospitalization, will keep her single agent Daratumumab. Will proceed with her 7th treatment of Daratumumab today.  Plan check SPEP, light chain ratio at next visit.   # Grade 3 Neutropenia: combination of recent infection and due to chemotherapy( daratumumab and pomalyst). Continue to monitor. She is on Levaquin '750mg'$  every other day which was prescribed from hospital.  Plan start Granix daily x 2, will send paper for approval.    # Right upper extremity DVT, provoked due to being on Pomalyst. Continue Eliquis '5mg'$  BID, plan 3 months anticoagulation.  # awaiting dental clearance to start Zometa.  # plan follow up in one week for Daratumumab treatment.    All questions were answered. The patient knows to call the clinic with any problems questions or concerns. Total face to face encounter time for this patient visit was 40 min. >50% of the time was  spent in counseling and coordination of care.  Return of visit: 1 week  Earlie Server, MD, PhD Hematology Oncology [facility] at [facility] Pager- [phone] 03/09/2018

## 2018-03-09 NOTE — Progress Notes (Signed)
Patient c/o of fatigue and loss of appetite

## 2018-03-09 NOTE — Telephone Encounter (Signed)
Verbals have been given to greg.

## 2018-03-09 NOTE — Telephone Encounter (Signed)
Please advise 

## 2018-03-09 NOTE — Telephone Encounter (Signed)
Copied from [place] [id number]. Topic: Quick Communication - See Telephone Encounter >> Mar 09, 2018 11:18 AM Conception Chancy, NT wrote: CRM for notification. See Telephone encounter for: 03/09/18.  [name] is calling he is a PT with [facility] and is requesting verbals for PT 2x a week for 6 weeks.  [phone]

## 2018-03-09 NOTE — Telephone Encounter (Signed)
Vm left for Vinnie Level giving verbal orders.

## 2018-03-09 NOTE — Telephone Encounter (Signed)
Ok   TMS 

## 2018-03-10 ENCOUNTER — Inpatient Hospital Stay: Payer: Medicare PPO

## 2018-03-10 NOTE — Addendum Note (Signed)
Addended by: Earlie Server on: 03/10/2018 01:40 PM   Modules accepted: Orders

## 2018-03-11 ENCOUNTER — Inpatient Hospital Stay: Payer: Medicare PPO

## 2018-03-12 ENCOUNTER — Ambulatory Visit: Payer: Medicare PPO

## 2018-03-12 LAB — BLOOD GAS, VENOUS
Acid-Base Excess: 5.6 mmol/L — ABNORMAL HIGH (ref 0.0–2.0)
Bicarbonate: 29.8 mmol/L — ABNORMAL HIGH (ref 20.0–28.0)
Patient temperature: 37
pCO2, Ven: 41 mmHg — ABNORMAL LOW (ref 44.0–60.0)
pH, Ven: 7.47 — ABNORMAL HIGH (ref 7.250–7.430)

## 2018-03-16 ENCOUNTER — Inpatient Hospital Stay (HOSPITAL_BASED_OUTPATIENT_CLINIC_OR_DEPARTMENT_OTHER): Payer: Medicare PPO | Admitting: Oncology

## 2018-03-16 ENCOUNTER — Encounter: Payer: Self-pay | Admitting: Oncology

## 2018-03-16 ENCOUNTER — Inpatient Hospital Stay: Payer: Medicare PPO

## 2018-03-16 VITALS — BP 102/57 | HR 74 | Temp 97.2°F | Resp 18 | Ht 62.0 in | Wt 129.5 lb

## 2018-03-16 DIAGNOSIS — R5381 Other malaise: Secondary | ICD-10-CM

## 2018-03-16 DIAGNOSIS — K219 Gastro-esophageal reflux disease without esophagitis: Secondary | ICD-10-CM

## 2018-03-16 DIAGNOSIS — I10 Essential (primary) hypertension: Secondary | ICD-10-CM

## 2018-03-16 DIAGNOSIS — C7951 Secondary malignant neoplasm of bone: Secondary | ICD-10-CM | POA: Diagnosis not present

## 2018-03-16 DIAGNOSIS — E876 Hypokalemia: Secondary | ICD-10-CM | POA: Diagnosis not present

## 2018-03-16 DIAGNOSIS — Z79899 Other long term (current) drug therapy: Secondary | ICD-10-CM | POA: Diagnosis not present

## 2018-03-16 DIAGNOSIS — Z5112 Encounter for antineoplastic immunotherapy: Secondary | ICD-10-CM | POA: Diagnosis not present

## 2018-03-16 DIAGNOSIS — J449 Chronic obstructive pulmonary disease, unspecified: Secondary | ICD-10-CM

## 2018-03-16 DIAGNOSIS — C9002 Multiple myeloma in relapse: Secondary | ICD-10-CM

## 2018-03-16 DIAGNOSIS — Z87440 Personal history of urinary (tract) infections: Secondary | ICD-10-CM

## 2018-03-16 DIAGNOSIS — D702 Other drug-induced agranulocytosis: Secondary | ICD-10-CM

## 2018-03-16 DIAGNOSIS — Z8701 Personal history of pneumonia (recurrent): Secondary | ICD-10-CM

## 2018-03-16 DIAGNOSIS — Z7901 Long term (current) use of anticoagulants: Secondary | ICD-10-CM

## 2018-03-16 DIAGNOSIS — E785 Hyperlipidemia, unspecified: Secondary | ICD-10-CM | POA: Diagnosis not present

## 2018-03-16 DIAGNOSIS — I82611 Acute embolism and thrombosis of superficial veins of right upper extremity: Secondary | ICD-10-CM | POA: Diagnosis not present

## 2018-03-16 DIAGNOSIS — Z923 Personal history of irradiation: Secondary | ICD-10-CM

## 2018-03-16 DIAGNOSIS — R5383 Other fatigue: Secondary | ICD-10-CM | POA: Diagnosis not present

## 2018-03-16 DIAGNOSIS — I82621 Acute embolism and thrombosis of deep veins of right upper extremity: Secondary | ICD-10-CM

## 2018-03-16 DIAGNOSIS — Z5111 Encounter for antineoplastic chemotherapy: Secondary | ICD-10-CM

## 2018-03-16 DIAGNOSIS — Z87891 Personal history of nicotine dependence: Secondary | ICD-10-CM

## 2018-03-16 LAB — COMPREHENSIVE METABOLIC PANEL
ALK PHOS: 72 U/L (ref 38–126)
ALT: 14 U/L (ref 14–54)
ANION GAP: 12 (ref 5–15)
AST: 21 U/L (ref 15–41)
Albumin: 2.9 g/dL — ABNORMAL LOW (ref 3.5–5.0)
BILIRUBIN TOTAL: 0.4 mg/dL (ref 0.3–1.2)
BUN: 11 mg/dL (ref 6–20)
CALCIUM: 9.3 mg/dL (ref 8.9–10.3)
CO2: 25 mmol/L (ref 22–32)
Chloride: 104 mmol/L (ref 101–111)
Creatinine, Ser: 0.76 mg/dL (ref 0.44–1.00)
GFR calc Af Amer: >60 mL/min (ref >60)
GFR calc non Af Amer: >60 mL/min (ref >60)
Glucose, Bld: 139 mg/dL — ABNORMAL HIGH (ref 65–99)
Potassium: 3 mmol/L — ABNORMAL LOW (ref 3.5–5.1)
Sodium: 141 mmol/L (ref 135–145)
TOTAL PROTEIN: 6.2 g/dL — AB (ref 6.5–8.1)

## 2018-03-16 LAB — CBC WITH DIFFERENTIAL/PLATELET
BASOS ABS: 0.1 10*3/uL (ref 0–0.1)
BASOS PCT: 1 %
Eosinophils Absolute: 0.2 10*3/uL (ref 0–0.7)
Eosinophils Relative: 5 %
HEMATOCRIT: 34 % — AB (ref 35.0–47.0)
Hemoglobin: 11.3 g/dL — ABNORMAL LOW (ref 12.0–16.0)
Lymphocytes Relative: 21 %
Lymphs Abs: 1 10*3/uL (ref 1.0–3.6)
MCH: 29.4 pg (ref 26.0–34.0)
MCHC: 33.2 g/dL (ref 32.0–36.0)
MCV: 88.6 fL (ref 80.0–100.0)
MONO ABS: 0.4 10*3/uL (ref 0.2–0.9)
Monocytes Relative: 10 %
NEUTROS ABS: 2.9 10*3/uL (ref 1.4–6.5)
NEUTROS PCT: 63 %
Platelets: 352 10*3/uL (ref 150–440)
RBC: 3.84 MIL/uL (ref 3.80–5.20)
RDW: 16.1 % — AB (ref 11.5–14.5)
WBC: 4.6 10*3/uL (ref 3.6–11.0)

## 2018-03-16 MED ORDER — POTASSIUM CHLORIDE CRYS ER 20 MEQ PO TBCR: 20.0000 meq | EXTENDED_RELEASE_TABLET | Freq: Every day | ORAL | 0 refills | Status: DC

## 2018-03-16 MED ORDER — SODIUM CHLORIDE 0.9 % IV SOLN
Freq: Once | INTRAVENOUS | Status: AC
  Administered 2018-03-16: 12:00:00 via INTRAVENOUS
  Filled 2018-03-16: qty 1000

## 2018-03-16 MED ORDER — SODIUM CHLORIDE 0.9 % IV SOLN
900.0000 mg | Freq: Once | INTRAVENOUS | Status: AC
  Administered 2018-03-16: 900 mg via INTRAVENOUS
  Filled 2018-03-16: qty 40

## 2018-03-16 MED ORDER — DIPHENHYDRAMINE HCL 25 MG PO CAPS
50.0000 mg | ORAL_CAPSULE | Freq: Once | ORAL | Status: AC
Start: 2018-03-16 — End: 2018-03-16
  Administered 2018-03-16: 50 mg via ORAL
  Filled 2018-03-16: qty 2

## 2018-03-16 MED ORDER — SODIUM CHLORIDE 0.9 % IV SOLN
20.0000 mg | Freq: Once | INTRAVENOUS | Status: AC
  Administered 2018-03-16: 20 mg via INTRAVENOUS
  Filled 2018-03-16: qty 2

## 2018-03-16 MED ORDER — SODIUM CHLORIDE 0.9 % IV SOLN
Freq: Once | INTRAVENOUS | Status: AC
  Administered 2018-03-16: 12:00:00 via INTRAVENOUS
  Filled 2018-03-16: qty 250

## 2018-03-16 MED ORDER — POTASSIUM CHLORIDE 20 MEQ/100ML IV SOLN: 20.0000 meq | Freq: Once | INTRAVENOUS | Status: DC

## 2018-03-16 MED ORDER — ACETAMINOPHEN 325 MG PO TABS
650.0000 mg | ORAL_TABLET | Freq: Once | ORAL | Status: AC
  Administered 2018-03-16: 650 mg via ORAL
  Filled 2018-03-16: qty 2

## 2018-03-16 MED ORDER — PROCHLORPERAZINE MALEATE 10 MG PO TABS
10.0000 mg | ORAL_TABLET | Freq: Once | ORAL | Status: AC
  Administered 2018-03-16: 10 mg via ORAL
  Filled 2018-03-16: qty 1

## 2018-03-16 MED ORDER — SODIUM CHLORIDE 0.9 % IV SOLN: 16.0000 mg/kg | Freq: Once | INTRAVENOUS | Status: DC

## 2018-03-16 NOTE — Progress Notes (Signed)
Increase edema to bilateral feet , but more to the left

## 2018-03-16 NOTE — Progress Notes (Signed)
Hematology/Oncology Follow up note [facility] Telephone:([phone] Fax:(336) [phone]   Patient Care Team: McLean-Scocuzza, Nino Glow, MD as PCP - General (Internal Medicine)  REFERRING PROVIDER: Dr. Baruch Gouty, Donette  REASON FOR VISIT Follow up for treatment of MM  HISTORY OF PRESENTING ILLNESS:  Cassie Jones is a  72 y.o.  female with PMH listed below who was referred to me for evaluation of multiple myeloma.  Patient used to follow-up with local oncologist Dr. Tonia Brooms and [facility] oncologist Dr. Amalia Hailey.  Patient tells me that as her husband works every day and has a busy working schedule, she is not able to get transportation to her treatment.  As an alternative she is now living with her brother who is close to [facility] regional [place] and want to transfer care here. Extensive medical record review was performed.    Patient was diagnosed with IgG lambda, ISS2, t (4,14) multiple myeloma on February 03, 2017, she has hemoglobin of 9, calcium 8.9, LDH 132, beta microglobulin 4.6, albumin 3.2.  Skeletal survey negative.  PET scan with single nonspecific humeral lesion and a thyroid nodule.  Marrow with 60% plasmacytosis by CD138 IHC, FISH with hyperdiploidy t(4, 14), 1 q. gain.. Per note, she was treated with CyborD (M spike 3.6 at start)  from 01/2017 to 02/2017. Her treatment was switched to Revlimid Velcade dexamethasone from 02/2017, M spike 1.2 at start,and was found to have progression of disease in 09/2017, when her M spike increased to 1.8, and developed compression fracture in 10/2016. She was restarted on RVD using weekly Velcade.  She was recently seen by Dr. Boris Sharper and recommended to start on a combination of daratumumab, Pomalyst, dexamethasone. Per patient she received her first daratumumab last week with split dose '8mg'$ /kg given on 3/14 and 3/15.  Patient reports that she tolerate the treatment well except mild infusion reaction.  # Patient  also reports that she has got dental clearance and has been given 1 dose of Zometa by Dr. Remus Loffler (unkown dates).  Autologous transplant has been discussed with patient at [facility] and patient has not decided.  # Patient was seen by nurse practitioner last week prior to her cycle 2 daratumumab.  She reports feeling shortness of breath, having cough, believed to have bronchitis and was prescribed Levaquin 500 mg p.o. daily for 7 days, and albuterol as needed.  She also had a VQ scan low probability for pulmonary embolism.    INTERVAL HISTORY Cassie Jones is a 72 y.o. female who has above history reviewed by me today presents for follow up visit for management of multiple myeloma. She was recently hospitalized twice for UTI and pneumonia.   Current Treatment  # 3/14, 3/15 at outside facility, she got split dose for first treatment of daratumumab. #  01/08/2018 Start on weekly daratumumab '16mg'$ /m2, dexamethasone '20mg'$ ,  S/p # Palliative RT to spine. 01/08/2018 Cycle 1 Pomalyst '4mg'$  Day 1-21 ogf 28 day cycle.   # 01/29/2018 5th weekly treatment of Daratumumab,  Cycle 2 Pomalyst was started for a week and was nterrupted due to acute bronchitis treated with outpatient Azithromycin.    # 02/16/2018 developed right upper extremity swelling and tenderness, confirmed to be DVT. She was taking Aspirin.  Started on Eliquis. Aspirin is stopped.  # 02/16/2018 6th Daratumumab treatment. Was started on Cycle 3 Pomalyst, she has completed about 2 weeks treatment. She was admitted to hospital on 02/21/2018 for Klebsiella UTI and oncology was not consulted. She was treatment  with antibiotics and continued on Pomalyst. She was hospitalized again on 03/02/2018 for pneumonia, COPD exacerbation/emphasema and oncology was consulted and patient was seen by my colleague Dr.Finnergan and advise patient to stop Pomalyst.  INTERVAL HISTORY Cassie Jones is a 72 y.o. female who has above history reviewed by me today presents  for follow up visit for management of multiple myeloma.  She is currently on monotherapy Daratumumab. Can not tolerate Pomalyst '4mg'$  due to recurrently neutropenia and infection which required hospitalization.   She was recently seen by [facility] Dr.Tuchman who recommends dose reduced Pomalyst '3mg'$ . This was previously discussed with patient, and she was reluctant to be restarted on Pomalyst. Today she seems to be more acceptable for restarting Pomalyst, but requests to start in a few weeks so that she can have a nice break.  She feels quite well today, except still having fatigue.  Cough has resolved.   Current Pain medication: Oxycodone '5mg'$  Q8h as needed. Oxycontin '10mg'$  Q12h.   Review of Systems  Constitutional: Positive for malaise/fatigue. Negative for chills, fever and weight loss.  HENT: Negative for congestion, ear discharge, ear pain, hearing loss, nosebleeds, sinus pain and sore throat.   Eyes: Negative for blurred vision, double vision, photophobia, pain, discharge and redness.  Respiratory: Negative for cough, hemoptysis, sputum production, shortness of breath and wheezing.   Cardiovascular: Negative for chest pain, palpitations, orthopnea, claudication and leg swelling.  Gastrointestinal: Negative for abdominal pain, blood in stool, constipation, diarrhea, heartburn, melena, nausea and vomiting.  Genitourinary: Negative for dysuria, flank pain, frequency, hematuria and urgency.  Musculoskeletal: Negative for back pain, joint pain, myalgias and neck pain.  Skin: Negative for itching and rash.  Neurological: Negative for dizziness, tingling, tremors, sensory change, speech change, focal weakness, weakness and headaches.  Endo/Heme/Allergies: Negative for environmental allergies. Does not bruise/bleed easily.  Psychiatric/Behavioral: Negative for depression, hallucinations, substance abuse and suicidal ideas. The patient is not nervous/anxious.     MEDICAL HISTORY:  Past Medical History:    Diagnosis Date  . Ascites   . Asthma   . Bilateral leg edema   . Cancer ([place])   . Chicken pox   . Colon polyps   . Diverticulitis    with perforation 02/2017 and hosp with colostomy bag s/p removal   . Drug-induced neutropenia ([place]) 03/09/2018  . GERD (gastroesophageal reflux disease)   . Hyperlipidemia   . Hypertension   . Multiple myeloma ([place])    dx'ed 01/2018 follows Dr. Tasia Catchings and [facility] H/O   . Pleural effusion   . Thyroid nodule   . UTI (urinary tract infection)   . Vitamin D deficiency     SURGICAL HISTORY: Past Surgical History:  Procedure Laterality Date  . ABDOMINAL HYSTERECTOMY    . BREAST BIOPSY Bilateral yrs ago   benign  . CHOLECYSTECTOMY    . COLOSTOMY  2018  . COLOSTOMY REVERSAL     11 2018  . KYPHOPLASTY     11/2017 [place]     SOCIAL HISTORY: Social History   Socioeconomic History  . Marital status: Married    Spouse name: Not on file  . Number of children: Not on file  . Years of education: Not on file  . Highest education level: Not on file  Occupational History  . Not on file  Social Needs  . Financial resource strain: Not hard at all  . Food insecurity:    Worry: Never true    Inability: Never true  . Transportation needs:  Medical: No    Non-medical: No  Tobacco Use  . Smoking status: Former Research scientist (life sciences)  . Smokeless tobacco: Never Used  Substance and Sexual Activity  . Alcohol use: Not Currently  . Drug use: Never  . Sexual activity: Yes    Comment: husband   Lifestyle  . Physical activity:    Days per week: 0 days    Minutes per session: 0 min  . Stress: Not at all  Relationships  . Social connections:    Talks on phone: More than three times a week    Gets together: Once a week    Attends religious service: 1 to 4 times per year    Active member of club or organization: No    Attends meetings of clubs or organizations: Never    Relationship status: Married  . Intimate partner violence:    Fear of current or ex partner: No     Emotionally abused: No    Physically abused: No    Forced sexual activity: No  Other Topics Concern  . Not on file  Social History Narrative   Married    Husband lives in [place] while she gets treatment in this area    She lives alone in this area with family close by     FAMILY HISTORY: Family History  Problem Relation Age of Onset  . Breast cancer Mother 70  . Cancer Mother        breast   . Diabetes Mother   . Heart disease Mother   . Hyperlipidemia Mother   . Hypertension Mother   . Birth defects Brother   . Diabetes Brother   . Heart disease Brother   . Hyperlipidemia Brother   . Cancer Father        prostate met to liver     ALLERGIES:  is allergic to codeine.  MEDICATIONS:  Current Outpatient Medications  Medication Sig Dispense Refill  . acetaminophen (TYLENOL) 500 MG tablet Take 1,000 mg by mouth every 6 (six) hours as needed for mild pain or fever.    Marland Kitchen albuterol (VENTOLIN HFA) 108 (90 Base) MCG/ACT inhaler Inhale 2 puffs into the lungs every 6 (six) hours as needed. (Patient not taking: Reported on 03/09/2018) 1 Inhaler 1  . apixaban (ELIQUIS) 5 MG TABS tablet Take 2 tablets ('10mg'$ ) twice a day for 7 days. Take 1 tablet ('5mg'$ ) twice daily after first week. 70 tablet 0  . benzonatate (TESSALON) 200 MG capsule Take 1 capsule (200 mg total) by mouth 3 (three) times daily as needed for cough. (Patient not taking: Reported on 03/09/2018) 30 capsule 0  . Cholecalciferol 50000 units capsule Take 1 capsule (50,000 Units total) by mouth once a week. 13 capsule 1  . feeding supplement, ENSURE ENLIVE, (ENSURE ENLIVE) LIQD Take 237 mLs by mouth 3 (three) times daily between meals. 237 mL 12  . fluticasone (FLONASE) 50 MCG/ACT nasal spray Place 1-2 sprays into both nostrils daily. (Patient not taking: Reported on 03/09/2018) 16 g 2  . fluticasone (FLOVENT DISKUS) 50 MCG/BLIST diskus inhaler Inhale 1 puff into the lungs 2 (two) times daily. (Patient not taking: Reported on  03/09/2018) 1 Inhaler 3  . furosemide (LASIX) 20 MG tablet Take 1 tablet (20 mg total) by mouth daily as needed. (Patient taking differently: Take 20 mg by mouth daily as needed for edema. ) 30 tablet 2  . guaiFENesin (ROBITUSSIN) 100 MG/5ML SOLN Take 5 mLs (100 mg total) by mouth every 4 (four) hours  as needed for cough or to loosen phlegm. (Patient not taking: Reported on 03/09/2018) 1200 mL 0  . levofloxacin (LEVAQUIN) 750 MG tablet Take 1 tablet (750 mg total) by mouth every other day. 5 tablet 0  . loperamide (IMODIUM A-D) 2 MG tablet Take 2 mg by mouth 4 (four) times daily as needed for diarrhea or loose stools.    . montelukast (SINGULAIR) 10 MG tablet Take 10 mg by mouth at bedtime.    . ondansetron (ZOFRAN) 8 MG tablet Take 8 mg by mouth every 8 (eight) hours as needed for nausea.    . ondansetron (ZOFRAN-ODT) 4 MG disintegrating tablet Take 4 mg by mouth every 12 (twelve) hours as needed for nausea.    Marland Kitchen oxyCODONE (OXY IR/ROXICODONE) 5 MG immediate release tablet Take 5 mg by mouth 3 (three) times daily as needed for moderate pain.    Marland Kitchen oxyCODONE (OXYCONTIN) 10 mg 12 hr tablet Take 10 mg by mouth every 12 (twelve) hours as needed (severe pain).    . pantoprazole (PROTONIX) 40 MG tablet Take 1 tablet by mouth every morning.    . pravastatin (PRAVACHOL) 10 MG tablet Take 1 tablet by mouth at bedtime.    . prochlorperazine (COMPAZINE) 10 MG tablet Take 10 mg by mouth every 6 (six) hours as needed for nausea.     No current facility-administered medications for this visit.      PHYSICAL EXAMINATION: ECOG PERFORMANCE STATUS: 2 - Symptomatic, <50% confined to bed Vitals:   03/16/18 1126  BP: (!) 102/57  Pulse: 74  Resp: 18  Temp: (!) 97.2 F (36.2 C)  SpO2: 98%   Filed Weights   03/16/18 1126  Weight: 129 lb 8 oz (58.7 kg)    Physical Exam  Constitutional: She is oriented to person, place, and time and well-developed, well-nourished, and in no distress. No distress.  HENT:    Head: Normocephalic and atraumatic.  Nose: Nose normal.  Mouth/Throat: Oropharynx is clear and moist. No oropharyngeal exudate.  Eyes: Pupils are equal, round, and reactive to light. EOM are normal. Left eye exhibits no discharge. No scleral icterus.  Neck: Normal range of motion. Neck supple. No JVD present. No tracheal deviation present.  Cardiovascular: Normal rate, regular rhythm and normal heart sounds.  No murmur heard. Pulmonary/Chest: Effort normal and breath sounds normal. No respiratory distress. She has no wheezes. She has no rales. She exhibits no tenderness.  Decreased breath sounds bilaterally.   Abdominal: Soft. She exhibits no distension and no mass. There is no tenderness. There is no rebound and no guarding.  Musculoskeletal: Normal range of motion. She exhibits no edema or tenderness.  Lymphadenopathy:    She has no cervical adenopathy.  Neurological: She is alert and oriented to person, place, and time. No cranial nerve deficit. She exhibits normal muscle tone. Coordination normal.  Skin: Skin is warm and dry. No rash noted. She is not diaphoretic. No erythema. No pallor.  Psychiatric: Memory, affect and judgment normal.     LABORATORY DATA:  I have reviewed the data as listed Lab Results  Component Value Date   WBC 4.6 03/16/2018   HGB 11.3 (L) 03/16/2018   HCT 34.0 (L) 03/16/2018   MCV 88.6 03/16/2018   PLT 352 03/16/2018   Recent Labs    02/21/18 2006  03/02/18 1715 03/03/18 0420 03/09/18 0814  NA 136   < > 134* 138 140  K 3.2*   < > 3.0* 3.6 3.8  CL 103   < >  96* 108 101  CO2 25   < > '27 25 26  '$ GLUCOSE 117*   < > 129* 111* 107*  BUN 10   < > '14 11 11  '$ CREATININE 0.71   < > 0.83 0.81 0.74  CALCIUM 8.9   < > 8.6* 8.0* 9.1  GFRNONAA >60   < > >60 >60 >60  GFRAA >60   < > >60 >60 >60  PROT 6.6  --  6.5  --  6.1*  ALBUMIN 3.1*  --  2.9*  --  2.6*  AST 15  --  20  --  17  ALT 14  --  16  --  12*  ALKPHOS 63  --  55  --  57  BILITOT 1.3*  --  1.3*   --  1.0   < > = values in this interval not displayed.       ASSESSMENT & PLAN:  1. Multiple myeloma in relapse ([place])   2. Bone metastasis ([place])   3. Encounter for antineoplastic chemotherapy   4. Acute deep vein thrombosis (DVT) of ulnar vein of right upper extremity (H[facility])   5. Drug-induced neutropenia (H[facility])   6. Hypokalemia    #Multiple myeloma : Proceed with 8th weekly daratumumab single agent.  Pomalyst is on hold due to recurrent infection. She will have daratumumab in 1 week and after that her daratumumab scheduled will be switched to every 2 weeks for 15 weeks.  I had a lengthy discussion with patient that I agree with Dr.Tuchman that due to the aggressivity of her multiple myeloma, single agent Daratumumab may not be able to bring her disease to remission. She can be re-challenged with reduced dose Pomalyst. Patient agrees the plan, but requests to have break for now and start therapy in a few weeks. I think it is reasonable to wait given that her performance status has not improved much yet.  Plan restart Pomalyst in 3 weeks at a reduced dose. SPEP, light chain ratio pending.    # Grade 3 Neutropenia: resolved.  If she continues to be neutropenic despite pomalyst dose reduced to '3mg'$ , will give G-CSF support.  # Right upper extremity DVT, provoked due to being on Pomalyst. Continue Eliquis '5mg'$  BID  # Bone metastasis: awaiting dental clearance to start Zometa. Previously discussed about dental clearance, she has not obtained due to hospitalization.  # Hypokalemia: will give 67mq KCL IV today and I also sent RX of oral Potassium chloride 214m daily to pharmacy.   # plan follow up in one week for Daratumumab treatment.    All questions were answered. The patient knows to call the clinic with any problems questions or concerns.  Total face to face encounter time for this patient visit was 40 min. >50% of the time was  spent in counseling and coordination of care.  Return of  visit: 1 week  ZhEarlie ServerMD, PhD Hematology Oncology CoMountain Valley Regional Rehabilitation Hospitalt AlNew York-Presbyterian Hudson Valley Hospitalager- [phone]/28/2019

## 2018-03-18 ENCOUNTER — Encounter: Payer: Self-pay | Admitting: Internal Medicine

## 2018-03-18 ENCOUNTER — Ambulatory Visit: Payer: Medicare PPO | Admitting: Internal Medicine

## 2018-03-18 VITALS — BP 106/56 | HR 64 | Temp 98.0°F | Ht 62.0 in | Wt 128.0 lb

## 2018-03-18 DIAGNOSIS — E876 Hypokalemia: Secondary | ICD-10-CM | POA: Diagnosis not present

## 2018-03-18 DIAGNOSIS — C9000 Multiple myeloma not having achieved remission: Secondary | ICD-10-CM

## 2018-03-18 DIAGNOSIS — C7951 Secondary malignant neoplasm of bone: Secondary | ICD-10-CM | POA: Diagnosis not present

## 2018-03-18 DIAGNOSIS — J189 Pneumonia, unspecified organism: Secondary | ICD-10-CM | POA: Diagnosis not present

## 2018-03-18 DIAGNOSIS — M549 Dorsalgia, unspecified: Secondary | ICD-10-CM | POA: Diagnosis not present

## 2018-03-18 LAB — KAPPA/LAMBDA LIGHT CHAINS
KAPPA FREE LGHT CHN: 3.8 mg/L (ref 3.3–19.4)
Kappa, lambda light chain ratio: 0.63 (ref 0.26–1.65)
Lambda free light chains: 6 mg/L (ref 5.7–26.3)

## 2018-03-18 NOTE — Progress Notes (Signed)
Chief Complaint  Patient presents with  . Follow-up    HFU   HFU 1. 5/14-5/17 for sepsis 2/2 pneumonia HAP doing better no cough pt overall feeling better  2. MM back pain 2/10 today working with PT at home having tx 6/4 and chemo 6/18 lower dose 3 mg with pomalidamide per H/o. She just saw [facility] H/o and [place] H/o 02/2018   Review of Systems  Constitutional: Positive for weight loss.  HENT: Negative for hearing loss.   Eyes: Negative for blurred vision.  Respiratory: Negative for cough.   Cardiovascular: Negative for chest pain.  Gastrointestinal: Negative for abdominal pain.  Musculoskeletal: Positive for back pain.  Skin: Negative for rash.  Psychiatric/Behavioral: Negative for depression.   Past Medical History:  Diagnosis Date  . Ascites   . Asthma   . Bilateral leg edema   . Cancer ([place])   . Chicken pox   . Colon polyps   . Diverticulitis    with perforation 02/2017 and hosp with colostomy bag s/p removal   . Drug-induced neutropenia ([place]) 03/09/2018  . GERD (gastroesophageal reflux disease)   . Hyperlipidemia   . Hypertension   . Multiple myeloma ([place])    dx'ed 01/2018 follows Dr. Tasia Catchings and [facility] H/O   . Pleural effusion   . Pneumonia    03/02/18   . Thyroid nodule   . UTI (urinary tract infection)   . Vitamin D deficiency    Past Surgical History:  Procedure Laterality Date  . ABDOMINAL HYSTERECTOMY    . BREAST BIOPSY Bilateral yrs ago   benign  . CHOLECYSTECTOMY    . COLOSTOMY  2018  . COLOSTOMY REVERSAL     11 2018  . KYPHOPLASTY     11/2017 [place]    Family History  Problem Relation Age of Onset  . Breast cancer Mother 42  . Cancer Mother        breast   . Diabetes Mother   . Heart disease Mother   . Hyperlipidemia Mother   . Hypertension Mother   . Birth defects Brother   . Diabetes Brother   . Heart disease Brother   . Hyperlipidemia Brother   . Cancer Father        prostate met to liver    Social History   Socioeconomic History  . Marital  status: Married    Spouse name: Not on file  . Number of children: Not on file  . Years of education: Not on file  . Highest education level: Not on file  Occupational History  . Not on file  Social Needs  . Financial resource strain: Not hard at all  . Food insecurity:    Worry: Never true    Inability: Never true  . Transportation needs:    Medical: No    Non-medical: No  Tobacco Use  . Smoking status: Former Research scientist (life sciences)  . Smokeless tobacco: Never Used  Substance and Sexual Activity  . Alcohol use: Not Currently  . Drug use: Never  . Sexual activity: Yes    Comment: husband   Lifestyle  . Physical activity:    Days per week: 0 days    Minutes per session: 0 min  . Stress: Not at all  Relationships  . Social connections:    Talks on phone: More than three times a week    Gets together: Once a week    Attends religious service: 1 to 4 times per year    Active member of  club or organization: No    Attends meetings of clubs or organizations: Never    Relationship status: Married  . Intimate partner violence:    Fear of current or ex partner: No    Emotionally abused: No    Physically abused: No    Forced sexual activity: No  Other Topics Concern  . Not on file  Social History Narrative   Married    Husband lives in [place] while she gets treatment in this area    She lives alone in this area with family close by    No outpatient medications have been marked as taking for the 03/18/18 encounter (Office Visit) with McLean-Scocuzza, Nino Glow, MD.   Allergies  Allergen Reactions  . Codeine Nausea And Vomiting   Recent Results (from the past 2160 hour(s))  Multiple Myeloma Panel (SPEP&IFE w/QIG)     Status: Abnormal   Collection Time: 01/04/18 12:37 PM  Result Value Ref Range   IgG (Immunoglobin G), Serum 2,397 (H) 700 - 1,600 mg/dL   IgA 15 (L) 87 - 352 mg/dL    Comment: Result confirmed on concentration.   IgM (Immunoglobulin M), Srm 27 26 - 217 mg/dL   Total  Protein ELP 7.2 6.0 - 8.5 g/dL   Albumin SerPl Elph-Mcnc 3.2 2.9 - 4.4 g/dL   Alpha 1 0.3 0.0 - 0.4 g/dL   Alpha2 Glob SerPl Elph-Mcnc 1.1 (H) 0.4 - 1.0 g/dL   B-Globulin SerPl Elph-Mcnc 0.9 0.7 - 1.3 g/dL   Gamma Glob SerPl Elph-Mcnc 1.8 0.4 - 1.8 g/dL   M Protein SerPl Elph-Mcnc 1.5 (H) Not Observed g/dL   Globulin, Total 4.0 (H) 2.2 - 3.9 g/dL   Albumin/Glob SerPl 0.9 0.7 - 1.7   IFE 1 Comment     Comment: (NOTE) Immunofixation shows IgG monoclonal protein with lambda light chain specificity.    Please Note Comment     Comment: (NOTE) Protein electrophoresis scan will follow via computer, mail, or courier delivery. Performed At: [facility] [place], [place] [id number] Rush Farmer MD [id number] Performed at [facility], [address] [place], [place] [address]   Comprehensive metabolic panel     Status: Abnormal   Collection Time: 01/04/18  1:08 PM  Result Value Ref Range   Sodium 136 135 - 145 mmol/L   Potassium 3.9 3.5 - 5.1 mmol/L   Chloride 105 101 - 111 mmol/L   CO2 24 22 - 32 mmol/L   Glucose, Bld 138 (H) 65 - 99 mg/dL   BUN 13 6 - 20 mg/dL   Creatinine, Ser 0.64 0.44 - 1.00 mg/dL   Calcium 8.4 (L) 8.9 - 10.3 mg/dL   Total Protein 7.9 6.5 - 8.1 g/dL   Albumin 3.4 (L) 3.5 - 5.0 g/dL   AST 19 15 - 41 U/L   ALT 17 14 - 54 U/L   Alkaline Phosphatase 85 38 - 126 U/L   Total Bilirubin 1.2 0.3 - 1.2 mg/dL   GFR calc non Af Amer >60 >60 mL/min   GFR calc Af Amer >60 >60 mL/min    Comment: (NOTE) The eGFR has been calculated using the CKD EPI equation. This calculation has not been validated in all clinical situations. eGFR's persistently <60 mL/min signify possible Chronic Kidney Disease.    Anion gap 7 5 - 15    Comment: Performed at [facility], [address], [place], [place] [address]  CBC with Differential/Platelet     Status: None  Collection Time: 01/04/18  1:08 PM  Result Value Ref Range   WBC 5.9 3.6 -  11.0 K/uL   RBC 4.39 3.80 - 5.20 MIL/uL   Hemoglobin 13.7 12.0 - 16.0 g/dL   HCT 40.5 35.0 - 47.0 %   MCV 92.1 80.0 - 100.0 fL   MCH 31.2 26.0 - 34.0 pg   MCHC 33.8 32.0 - 36.0 g/dL   RDW 14.5 11.5 - 14.5 %   Platelets 166 150 - 440 K/uL   Neutrophils Relative % 69 %   Neutro Abs 4.1 1.4 - 6.5 K/uL   Lymphocytes Relative 18 %   Lymphs Abs 1.0 1.0 - 3.6 K/uL   Monocytes Relative 12 %   Monocytes Absolute 0.7 0.2 - 0.9 K/uL   Eosinophils Relative 1 %   Eosinophils Absolute 0.0 0 - 0.7 K/uL   Basophils Relative 0 %   Basophils Absolute 0.0 0 - 0.1 K/uL    Comment: Performed at [facility], [address] [place], [place] [address]  Type and screen     Status: None   Collection Time: 01/08/18  8:53 AM  Result Value Ref Range   ABO/RH(D) B POS    Antibody Screen POS    Sample Expiration 01/11/2018   Comprehensive metabolic panel     Status: Abnormal   Collection Time: 01/08/18  9:02 AM  Result Value Ref Range   Sodium 136 135 - 145 mmol/L   Potassium 3.9 3.5 - 5.1 mmol/L   Chloride 105 101 - 111 mmol/L   CO2 24 22 - 32 mmol/L   Glucose, Bld 107 (H) 65 - 99 mg/dL   BUN 13 6 - 20 mg/dL   Creatinine, Ser 0.71 0.44 - 1.00 mg/dL   Calcium 8.7 (L) 8.9 - 10.3 mg/dL   Total Protein 7.3 6.5 - 8.1 g/dL   Albumin 3.2 (L) 3.5 - 5.0 g/dL   AST 15 15 - 41 U/L   ALT 15 14 - 54 U/L   Alkaline Phosphatase 73 38 - 126 U/L   Total Bilirubin 1.1 0.3 - 1.2 mg/dL   GFR calc non Af Amer >60 >60 mL/min   GFR calc Af Amer >60 >60 mL/min    Comment: (NOTE) The eGFR has been calculated using the CKD EPI equation. This calculation has not been validated in all clinical situations. eGFR's persistently <60 mL/min signify possible Chronic Kidney Disease.    Anion gap 7 5 - 15    Comment: Performed at [facility], [address] [place], [place] [address]  CBC with Differential     Status: Abnormal   Collection Time: 01/08/18  9:02 AM  Result Value Ref Range   WBC 5.4 3.6 - 11.0  K/uL   RBC 4.01 3.80 - 5.20 MIL/uL   Hemoglobin 12.6 12.0 - 16.0 g/dL   HCT 36.8 35.0 - 47.0 %   MCV 91.7 80.0 - 100.0 fL   MCH 31.3 26.0 - 34.0 pg   MCHC 34.2 32.0 - 36.0 g/dL   RDW 14.5 11.5 - 14.5 %   Platelets 146 (L) 150 - 440 K/uL   Neutrophils Relative % 72 %   Neutro Abs 3.8 1.4 - 6.5 K/uL   Lymphocytes Relative 13 %   Lymphs Abs 0.7 (L) 1.0 - 3.6 K/uL   Monocytes Relative 14 %   Monocytes Absolute 0.8 0.2 - 0.9 K/uL   Eosinophils Relative 1 %   Eosinophils Absolute 0.1 0 - 0.7 K/uL   Basophils  Relative 0 %   Basophils Absolute 0.0 0 - 0.1 K/uL    Comment: Performed at [facility], [address] [place], [place] [address]  CBC with Differential/Platelet     Status: Abnormal   Collection Time: 01/14/18 11:15 AM  Result Value Ref Range   WBC 2.9 (L) 3.6 - 11.0 K/uL   RBC 4.01 3.80 - 5.20 MIL/uL   Hemoglobin 12.5 12.0 - 16.0 g/dL   HCT 36.4 35.0 - 47.0 %   MCV 90.8 80.0 - 100.0 fL   MCH 31.2 26.0 - 34.0 pg   MCHC 34.4 32.0 - 36.0 g/dL   RDW 14.4 11.5 - 14.5 %   Platelets 129 (L) 150 - 440 K/uL   Neutrophils Relative % 75 %   Neutro Abs 2.2 1.4 - 6.5 K/uL   Lymphocytes Relative 12 %   Lymphs Abs 0.3 (L) 1.0 - 3.6 K/uL   Monocytes Relative 11 %   Monocytes Absolute 0.3 0.2 - 0.9 K/uL   Eosinophils Relative 2 %   Eosinophils Absolute 0.1 0 - 0.7 K/uL   Basophils Relative 0 %   Basophils Absolute 0.0 0 - 0.1 K/uL    Comment: Performed at [facility], [place], [place] [address]  Fibrin derivatives D-Dimer [facility])     Status: Abnormal   Collection Time: 01/14/18 11:15 AM  Result Value Ref Range   Fibrin derivatives D-dimer ([facility]) 3,701.30 (H) 0.00 - 499.00 ng/mL (FEU)    Comment: (NOTE) <> Exclusion of Venous Thromboembolism (VTE) - OUTPATIENT ONLY   (Emergency Department or [place])   0-499 ng/ml (FEU): With a low to intermediate pretest probability                      for VTE this test result excludes the diagnosis                       of VTE.   >499 ng/ml (FEU) : VTE not excluded; additional work up for VTE is                      required. <> Testing on Inpatients and Evaluation of Disseminated Intravascular   Coagulation (DIC) Reference Range:   0-499 ng/ml (FEU) Performed at [facility], [address] [place], [place] [address]   Culture, blood (routine x 2)     Status: None   Collection Time: 01/14/18 11:15 AM  Result Value Ref Range   Specimen Description      BLOOD Performed at [facility], [address], [place], [place] [address]    Special Requests      NONE Performed at [facility], [address] [place], [place] [address]    Culture      NO GROWTH 5 DAYS Performed at [facility], [address], [place], [place] [address]    Report Status 01/19/2018 FINAL   Comprehensive metabolic panel     Status: Abnormal   Collection Time: 01/14/18 11:15 AM  Result Value Ref Range   Sodium 135 135 - 145 mmol/L   Potassium 3.7 3.5 - 5.1 mmol/L   Chloride 103 101 - 111 mmol/L   CO2 24 22 - 32 mmol/L   Glucose, Bld 105 (H) 65 - 99 mg/dL   BUN 13 6 - 20 mg/dL   Creatinine, Ser 0.59 0.44 - 1.00 mg/dL   Calcium 8.7 (L) 8.9 - 10.3 mg/dL  Total Protein 7.1 6.5 - 8.1 g/dL   Albumin 3.1 (L) 3.5 - 5.0 g/dL   AST 16 15 - 41 U/L   ALT 17 14 - 54 U/L   Alkaline Phosphatase 97 38 - 126 U/L   Total Bilirubin 1.2 0.3 - 1.2 mg/dL   GFR calc non Af Amer >60 >60 mL/min   GFR calc Af Amer >60 >60 mL/min    Comment: (NOTE) The eGFR has been calculated using the CKD EPI equation. This calculation has not been validated in all clinical situations. eGFR's persistently <60 mL/min signify possible Chronic Kidney Disease.    Anion gap 8 5 - 15    Comment: Performed at [facility], [address] [place], [place] [address]  Culture, blood (routine x 2)     Status: None   Collection Time: 01/14/18 11:16 AM  Result Value Ref Range   Specimen Description       BLOOD Performed at [facility], [address] [place], [place] [address]    Special Requests      NONE Performed at [facility], [address] [place], [place] [address]    Culture      NO GROWTH 5 DAYS Performed at [facility], [address] [place], [place] [address]    Report Status 01/19/2018 FINAL   Comprehensive metabolic panel     Status: Abnormal   Collection Time: 01/22/18  8:21 AM  Result Value Ref Range   Sodium 137 135 - 145 mmol/L   Potassium 4.3 3.5 - 5.1 mmol/L   Chloride 106 101 - 111 mmol/L   CO2 24 22 - 32 mmol/L   Glucose, Bld 103 (H) 65 - 99 mg/dL   BUN 11 6 - 20 mg/dL   Creatinine, Ser 0.69 0.44 - 1.00 mg/dL   Calcium 8.7 (L) 8.9 - 10.3 mg/dL   Total Protein 6.8 6.5 - 8.1 g/dL   Albumin 3.2 (L) 3.5 - 5.0 g/dL   AST 15 15 - 41 U/L   ALT 13 (L) 14 - 54 U/L   Alkaline Phosphatase 95 38 - 126 U/L   Total Bilirubin 1.0 0.3 - 1.2 mg/dL   GFR calc non Af Amer >60 >60 mL/min   GFR calc Af Amer >60 >60 mL/min    Comment: (NOTE) The eGFR has been calculated using the CKD EPI equation. This calculation has not been validated in all clinical situations. eGFR's persistently <60 mL/min signify possible Chronic Kidney Disease.    Anion gap 7 5 - 15    Comment: Performed at [facility], [address] [place], [place] [address]  CBC with Differential     Status: Abnormal   Collection Time: 01/22/18  8:21 AM  Result Value Ref Range   WBC 2.4 (L) 3.6 - 11.0 K/uL   RBC 4.03 3.80 - 5.20 MIL/uL   Hemoglobin 12.5 12.0 - 16.0 g/dL   HCT 36.1 35.0 - 47.0 %   MCV 89.6 80.0 - 100.0 fL   MCH 30.9 26.0 - 34.0 pg   MCHC 34.5 32.0 - 36.0 g/dL   RDW 14.2 11.5 - 14.5 %   Platelets 115 (L) 150 - 440 K/uL   Neutrophils Relative % 42 %   Neutro Abs 1.0 (L) 1.4 - 6.5 K/uL   Lymphocytes Relative 27 %   Lymphs Abs 0.6 (L) 1.0 - 3.6 K/uL   Monocytes Relative 28 %   Monocytes Absolute 0.7 0.2 - 0.9 K/uL  Eosinophils Relative 2 %    Eosinophils Absolute 0.0 0 - 0.7 K/uL   Basophils Relative 1 %   Basophils Absolute 0.0 0 - 0.1 K/uL    Comment: Performed at [facility], [address] [place], [place] [address]  Comprehensive metabolic panel     Status: Abnormal   Collection Time: 01/29/18 10:56 AM  Result Value Ref Range   Sodium 137 135 - 145 mmol/L   Potassium 3.8 3.5 - 5.1 mmol/L   Chloride 107 101 - 111 mmol/L   CO2 23 22 - 32 mmol/L   Glucose, Bld 97 65 - 99 mg/dL   BUN 11 6 - 20 mg/dL   Creatinine, Ser 0.52 0.44 - 1.00 mg/dL   Calcium 8.4 (L) 8.9 - 10.3 mg/dL   Total Protein 6.3 (L) 6.5 - 8.1 g/dL   Albumin 3.1 (L) 3.5 - 5.0 g/dL   AST 14 (L) 15 - 41 U/L   ALT 13 (L) 14 - 54 U/L   Alkaline Phosphatase 78 38 - 126 U/L   Total Bilirubin 1.2 0.3 - 1.2 mg/dL   GFR calc non Af Amer >60 >60 mL/min   GFR calc Af Amer >60 >60 mL/min    Comment: (NOTE) The eGFR has been calculated using the CKD EPI equation. This calculation has not been validated in all clinical situations. eGFR's persistently <60 mL/min signify possible Chronic Kidney Disease.    Anion gap 7 5 - 15    Comment: Performed at [facility], [address] [place], [place] [address]  CBC with Differential     Status: Abnormal   Collection Time: 01/29/18 10:56 AM  Result Value Ref Range   WBC 2.2 (L) 3.6 - 11.0 K/uL   RBC 4.12 3.80 - 5.20 MIL/uL   Hemoglobin 12.5 12.0 - 16.0 g/dL   HCT 36.6 35.0 - 47.0 %   MCV 88.8 80.0 - 100.0 fL   MCH 30.4 26.0 - 34.0 pg   MCHC 34.3 32.0 - 36.0 g/dL   RDW 14.8 (H) 11.5 - 14.5 %   Platelets 156 150 - 440 K/uL   Neutrophils Relative % 30 %   Neutro Abs 0.7 (L) 1.4 - 6.5 K/uL   Lymphocytes Relative 29 %   Lymphs Abs 0.6 (L) 1.0 - 3.6 K/uL   Monocytes Relative 38 %   Monocytes Absolute 0.9 0.2 - 0.9 K/uL   Eosinophils Relative 2 %   Eosinophils Absolute 0.0 0 - 0.7 K/uL   Basophils Relative 1 %   Basophils Absolute 0.0 0 - 0.1 K/uL    Comment: Performed at [facility], [address] [place], [place] [address]  Comprehensive metabolic panel     Status: Abnormal   Collection Time: 02/05/18  8:26 AM  Result Value Ref Range   Sodium 137 135 - 145 mmol/L   Potassium 3.4 (L) 3.5 - 5.1 mmol/L   Chloride 105 101 - 111 mmol/L   CO2 24 22 - 32 mmol/L   Glucose, Bld 98 65 - 99 mg/dL   BUN 10 6 - 20 mg/dL   Creatinine, Ser 0.66 0.44 - 1.00 mg/dL   Calcium 8.7 (L) 8.9 - 10.3 mg/dL   Total Protein 6.3 (L) 6.5 - 8.1 g/dL   Albumin 3.1 (L) 3.5 - 5.0 g/dL   AST 19 15 - 41 U/L   ALT 15 14 - 54 U/L   Alkaline Phosphatase 68 38 - 126 U/L   Total Bilirubin 1.0 0.3 -  1.2 mg/dL   GFR calc non Af Amer >60 >60 mL/min   GFR calc Af Amer >60 >60 mL/min    Comment: (NOTE) The eGFR has been calculated using the CKD EPI equation. This calculation has not been validated in all clinical situations. eGFR's persistently <60 mL/min signify possible Chronic Kidney Disease.    Anion gap 8 5 - 15    Comment: Performed at [facility], [address] [place], [place] [address]  CBC with Differential     Status: Abnormal   Collection Time: 02/05/18  8:26 AM  Result Value Ref Range   WBC 2.7 (L) 3.6 - 11.0 K/uL   RBC 4.28 3.80 - 5.20 MIL/uL   Hemoglobin 13.0 12.0 - 16.0 g/dL   HCT 38.1 35.0 - 47.0 %   MCV 89.1 80.0 - 100.0 fL   MCH 30.3 26.0 - 34.0 pg   MCHC 34.0 32.0 - 36.0 g/dL   RDW 14.9 (H) 11.5 - 14.5 %   Platelets 195 150 - 440 K/uL   Neutrophils Relative % 29 %   Neutro Abs 0.8 (L) 1.4 - 6.5 K/uL   Lymphocytes Relative 22 %   Lymphs Abs 0.6 (L) 1.0 - 3.6 K/uL   Monocytes Relative 46 %   Monocytes Absolute 1.3 (H) 0.2 - 0.9 K/uL   Eosinophils Relative 1 %   Eosinophils Absolute 0.0 0 - 0.7 K/uL   Basophils Relative 2 %   Basophils Absolute 0.1 0 - 0.1 K/uL    Comment: Performed at [facility], [address] [place], [place] [address]  Comprehensive metabolic panel     Status: Abnormal   Collection Time: 02/16/18  8:10 AM  Result Value Ref Range    Sodium 138 135 - 145 mmol/L   Potassium 3.7 3.5 - 5.1 mmol/L   Chloride 101 101 - 111 mmol/L   CO2 27 22 - 32 mmol/L   Glucose, Bld 109 (H) 65 - 99 mg/dL   BUN 14 6 - 20 mg/dL   Creatinine, Ser 0.71 0.44 - 1.00 mg/dL   Calcium 9.2 8.9 - 10.3 mg/dL   Total Protein 6.5 6.5 - 8.1 g/dL   Albumin 3.3 (L) 3.5 - 5.0 g/dL   AST 19 15 - 41 U/L   ALT 15 14 - 54 U/L   Alkaline Phosphatase 75 38 - 126 U/L   Total Bilirubin 1.6 (H) 0.3 - 1.2 mg/dL   GFR calc non Af Amer >60 >60 mL/min   GFR calc Af Amer >60 >60 mL/min    Comment: (NOTE) The eGFR has been calculated using the CKD EPI equation. This calculation has not been validated in all clinical situations. eGFR's persistently <60 mL/min signify possible Chronic Kidney Disease.    Anion gap 10 5 - 15    Comment: Performed at [facility], [address] [place], [place] [address]  CBC with Differential     Status: Abnormal   Collection Time: 02/16/18  8:10 AM  Result Value Ref Range   WBC 6.3 3.6 - 11.0 K/uL   RBC 4.15 3.80 - 5.20 MIL/uL   Hemoglobin 12.8 12.0 - 16.0 g/dL   HCT 37.2 35.0 - 47.0 %   MCV 89.7 80.0 - 100.0 fL   MCH 30.9 26.0 - 34.0 pg   MCHC 34.5 32.0 - 36.0 g/dL   RDW 15.8 (H) 11.5 - 14.5 %   Platelets 202 150 - 440 K/uL   Neutrophils Relative % 66 %   Neutro  Abs 4.1 1.4 - 6.5 K/uL   Lymphocytes Relative 15 %   Lymphs Abs 0.9 (L) 1.0 - 3.6 K/uL   Monocytes Relative 15 %   Monocytes Absolute 1.0 (H) 0.2 - 0.9 K/uL   Eosinophils Relative 3 %   Eosinophils Absolute 0.2 0 - 0.7 K/uL   Basophils Relative 1 %   Basophils Absolute 0.1 0 - 0.1 K/uL    Comment: Performed at [facility], [address] [place], [place] [address]  Lactic acid, plasma     Status: None   Collection Time: 02/21/18  8:06 PM  Result Value Ref Range   Lactic Acid, Venous 1.3 0.5 - 1.9 mmol/L    Comment: Performed at [facility], [address] [place], [place] [address]  Comprehensive metabolic panel     Status:  Abnormal   Collection Time: 02/21/18  8:06 PM  Result Value Ref Range   Sodium 136 135 - 145 mmol/L   Potassium 3.2 (L) 3.5 - 5.1 mmol/L   Chloride 103 101 - 111 mmol/L   CO2 25 22 - 32 mmol/L   Glucose, Bld 117 (H) 65 - 99 mg/dL   BUN 10 6 - 20 mg/dL   Creatinine, Ser 0.71 0.44 - 1.00 mg/dL   Calcium 8.9 8.9 - 10.3 mg/dL   Total Protein 6.6 6.5 - 8.1 g/dL   Albumin 3.1 (L) 3.5 - 5.0 g/dL   AST 15 15 - 41 U/L   ALT 14 14 - 54 U/L   Alkaline Phosphatase 63 38 - 126 U/L   Total Bilirubin 1.3 (H) 0.3 - 1.2 mg/dL   GFR calc non Af Amer >60 >60 mL/min   GFR calc Af Amer >60 >60 mL/min    Comment: (NOTE) The eGFR has been calculated using the CKD EPI equation. This calculation has not been validated in all clinical situations. eGFR's persistently <60 mL/min signify possible Chronic Kidney Disease.    Anion gap 8 5 - 15    Comment: Performed at [facility], [address] [place], [place] [address]  CBC WITH DIFFERENTIAL     Status: Abnormal   Collection Time: 02/21/18  8:06 PM  Result Value Ref Range   WBC 4.3 3.6 - 11.0 K/uL   RBC 4.18 3.80 - 5.20 MIL/uL   Hemoglobin 12.6 12.0 - 16.0 g/dL   HCT 37.6 35.0 - 47.0 %   MCV 90.0 80.0 - 100.0 fL   MCH 30.3 26.0 - 34.0 pg   MCHC 33.6 32.0 - 36.0 g/dL   RDW 15.8 (H) 11.5 - 14.5 %   Platelets 196 150 - 440 K/uL   Neutrophils Relative % 74 %   Neutro Abs 3.2 1.4 - 6.5 K/uL   Lymphocytes Relative 8 %   Lymphs Abs 0.3 (L) 1.0 - 3.6 K/uL   Monocytes Relative 10 %   Monocytes Absolute 0.4 0.2 - 0.9 K/uL   Eosinophils Relative 8 %   Eosinophils Absolute 0.4 0 - 0.7 K/uL   Basophils Relative 0 %   Basophils Absolute 0.0 0 - 0.1 K/uL    Comment: Performed at [facility], [address] [place], [place] [address]  Blood Culture (routine x 2)     Status: None   Collection Time: 02/21/18  8:06 PM  Result Value Ref Range   Specimen Description BLOOD BLOOD LEFT HAND    Special Requests      BOTTLES DRAWN AEROBIC  AND ANAEROBIC Blood Culture adequate volume  Culture      NO GROWTH 5 DAYS Performed at [facility], [address] [place], [place] [address]    Report Status 02/26/2018 FINAL   Protime-INR     Status: Abnormal   Collection Time: 02/21/18  8:06 PM  Result Value Ref Range   Prothrombin Time 15.8 (H) 11.4 - 15.2 seconds   INR 1.27     Comment: Performed at [facility], [address] [place], [place] [address]  Blood Culture (routine x 2)     Status: None   Collection Time: 02/21/18  8:08 PM  Result Value Ref Range   Specimen Description BLOOD LEFT ANTECUBITAL    Special Requests      BOTTLES DRAWN AEROBIC AND ANAEROBIC Blood Culture adequate volume   Culture      NO GROWTH 5 DAYS Performed at [facility], [address] [place], [place] [address]    Report Status 02/26/2018 FINAL   Urinalysis, Routine w reflex microscopic     Status: Abnormal   Collection Time: 02/21/18  9:02 PM  Result Value Ref Range   Color, Urine YELLOW (A) YELLOW   APPearance CLOUDY (A) CLEAR   Specific Gravity, Urine 1.015 1.005 - 1.030   pH 6.0 5.0 - 8.0   Glucose, UA NEGATIVE NEGATIVE mg/dL   Hgb urine dipstick SMALL (A) NEGATIVE   Bilirubin Urine NEGATIVE NEGATIVE   Ketones, ur NEGATIVE NEGATIVE mg/dL   Protein, ur 30 (A) NEGATIVE mg/dL   Nitrite POSITIVE (A) NEGATIVE   Leukocytes, UA MODERATE (A) NEGATIVE   RBC / HPF 0-5 0 - 5 RBC/hpf   WBC, UA 21-50 0 - 5 WBC/hpf   Bacteria, UA MANY (A) NONE SEEN   Squamous Epithelial / LPF 0-5 0 - 5    Comment: Please note change in reference range.   WBC Clumps PRESENT    Mucus PRESENT     Comment: Performed at [facility], [address] [place], [place] [address]  Urine culture     Status: Abnormal   Collection Time: 02/21/18  9:02 PM  Result Value Ref Range   Specimen Description      URINE, CATHETERIZED Performed at [facility], [address] [place], [place] [address]    Special  Requests      NONE Performed at [facility], [place], [place] [address]    Culture >=100,000 COLONIES/mL KLEBSIELLA PNEUMONIAE (A)    Report Status 02/24/2018 FINAL    Organism ID, Bacteria KLEBSIELLA PNEUMONIAE (A)       Susceptibility   Klebsiella pneumoniae - MIC*    AMPICILLIN RESISTANT Resistant     CEFAZOLIN <=4 SENSITIVE Sensitive     CEFTRIAXONE <=1 SENSITIVE Sensitive     CIPROFLOXACIN <=0.25 SENSITIVE Sensitive     GENTAMICIN <=1 SENSITIVE Sensitive     IMIPENEM <=0.25 SENSITIVE Sensitive     NITROFURANTOIN 64 INTERMEDIATE Intermediate     TRIMETH/SULFA <=20 SENSITIVE Sensitive     AMPICILLIN/SULBACTAM <=2 SENSITIVE Sensitive     PIP/TAZO <=4 SENSITIVE Sensitive     Extended ESBL NEGATIVE Sensitive     * >=100,000 COLONIES/mL KLEBSIELLA PNEUMONIAE  Basic metabolic panel     Status: Abnormal   Collection Time: 02/22/18  4:25 AM  Result Value Ref Range   Sodium 136 135 - 145 mmol/L   Potassium 3.0 (L) 3.5 - 5.1 mmol/L   Chloride 103 101 - 111 mmol/L   CO2 24 22 - 32 mmol/L  Glucose, Bld 118 (H) 65 - 99 mg/dL   BUN 7 6 - 20 mg/dL   Creatinine, Ser 0.60 0.44 - 1.00 mg/dL   Calcium 7.9 (L) 8.9 - 10.3 mg/dL   GFR calc non Af Amer >60 >60 mL/min   GFR calc Af Amer >60 >60 mL/min    Comment: (NOTE) The eGFR has been calculated using the CKD EPI equation. This calculation has not been validated in all clinical situations. eGFR's persistently <60 mL/min signify possible Chronic Kidney Disease.    Anion gap 9 5 - 15    Comment: Performed at [facility], [address] [place], [place] [address]  CBC     Status: Abnormal   Collection Time: 02/22/18  4:25 AM  Result Value Ref Range   WBC 3.7 3.6 - 11.0 K/uL   RBC 3.97 3.80 - 5.20 MIL/uL   Hemoglobin 12.3 12.0 - 16.0 g/dL   HCT 35.3 35.0 - 47.0 %   MCV 88.8 80.0 - 100.0 fL   MCH 30.9 26.0 - 34.0 pg   MCHC 34.8 32.0 - 36.0 g/dL   RDW 15.4 (H) 11.5 - 14.5 %   Platelets 156 150 -  440 K/uL    Comment: Performed at [facility], [address] [place], [place] [address]  Magnesium     Status: Abnormal   Collection Time: 02/22/18  4:25 AM  Result Value Ref Range   Magnesium 1.4 (L) 1.7 - 2.4 mg/dL    Comment: Performed at [facility], [address] [place], [place] [address]  Basic metabolic panel     Status: Abnormal   Collection Time: 02/23/18  4:54 AM  Result Value Ref Range   Sodium 132 (L) 135 - 145 mmol/L   Potassium 4.0 3.5 - 5.1 mmol/L   Chloride 102 101 - 111 mmol/L   CO2 24 22 - 32 mmol/L   Glucose, Bld 110 (H) 65 - 99 mg/dL   BUN 7 6 - 20 mg/dL   Creatinine, Ser 0.62 0.44 - 1.00 mg/dL   Calcium 7.9 (L) 8.9 - 10.3 mg/dL   GFR calc non Af Amer >60 >60 mL/min   GFR calc Af Amer >60 >60 mL/min    Comment: (NOTE) The eGFR has been calculated using the CKD EPI equation. This calculation has not been validated in all clinical situations. eGFR's persistently <60 mL/min signify possible Chronic Kidney Disease.    Anion gap 6 5 - 15    Comment: Performed at [facility], [address] [place], [place] [address]  Magnesium     Status: None   Collection Time: 02/23/18  4:54 AM  Result Value Ref Range   Magnesium 1.8 1.7 - 2.4 mg/dL    Comment: Performed at [facility], [address] [place], [place] [address]  Basic metabolic panel     Status: Abnormal   Collection Time: 02/25/18  8:02 AM  Result Value Ref Range   Sodium 127 (L) 135 - 145 mmol/L   Potassium 3.5 3.5 - 5.1 mmol/L   Chloride 96 (L) 101 - 111 mmol/L   CO2 21 (L) 22 - 32 mmol/L   Glucose, Bld 108 (H) 65 - 99 mg/dL   BUN 8 6 - 20 mg/dL   Creatinine, Ser 0.67 0.44 - 1.00 mg/dL   Calcium 7.9 (L) 8.9 - 10.3 mg/dL   GFR calc non Af Amer >60 >60 mL/min   GFR calc Af Amer >60 >60 mL/min  Comment: (NOTE) The eGFR has been calculated using the CKD EPI equation. This calculation has not been validated in all clinical situations. eGFR's  persistently <60 mL/min signify possible Chronic Kidney Disease.    Anion gap 10 5 - 15    Comment: Performed at [facility], [address] [place], [place] [address]  Magnesium     Status: None   Collection Time: 02/25/18  8:02 AM  Result Value Ref Range   Magnesium 1.8 1.7 - 2.4 mg/dL    Comment: Performed at [facility], [address] [place], [place] [address]  Phosphorus     Status: Abnormal   Collection Time: 02/25/18  8:02 AM  Result Value Ref Range   Phosphorus 2.1 (L) 2.5 - 4.6 mg/dL    Comment: Performed at [facility], [address] [place], [place] [address]  Basic Metabolic Panel (BMET)     Status: Abnormal   Collection Time: 03/02/18 10:33 AM  Result Value Ref Range   Sodium 138 135 - 145 mEq/L   Potassium 3.0 (L) 3.5 - 5.1 mEq/L   Chloride 97 96 - 112 mEq/L   CO2 29 19 - 32 mEq/L   Glucose, Bld 119 (H) 70 - 99 mg/dL   BUN 12 6 - 23 mg/dL   Creatinine, Ser 0.83 0.40 - 1.20 mg/dL   Calcium 9.0 8.4 - 10.5 mg/dL   GFR 72.10 >60.00 mL/min  CBC with Differential/Platelet     Status: Abnormal   Collection Time: 03/02/18 10:33 AM  Result Value Ref Range   WBC 2.2 Repeated and verified X2. (L) 4.0 - 10.5 K/uL   RBC 4.27 3.87 - 5.11 Mil/uL   Hemoglobin 12.6 12.0 - 15.0 g/dL   HCT 37.6 36.0 - 46.0 %   MCV 88.0 78.0 - 100.0 fl   MCHC 33.5 30.0 - 36.0 g/dL   RDW 15.5 11.5 - 15.5 %   Platelets (L) 150.0 - 400.0 K/uL    147.0 Result may be falsely decreased due to platelet clumping.   Neutrophils Relative % 30.9 (L) 43.0 - 77.0 %   Lymphocytes Relative 23.7 12.0 - 46.0 %   Monocytes Relative 17.6 (H) 3.0 - 12.0 %   Eosinophils Relative (H) 0.0 - 5.0 %    27.3 Manual smear review agrees with instrument differential.   Basophils Relative 0.5 0.0 - 3.0 %   Neutro Abs 0.7 (L) 1.4 - 7.7 K/uL   Lymphs Abs 0.5 (L) 0.7 - 4.0 K/uL   Monocytes Absolute 0.4 0.1 - 1.0 K/uL   Eosinophils Absolute 0.6 0.0 - 0.7 K/uL   Basophils Absolute 0.0  0.0 - 0.1 K/uL  Hemoglobin A1c     Status: None   Collection Time: 03/02/18 10:33 AM  Result Value Ref Range   Hgb A1c MFr Bld 6.0 4.6 - 6.5 %    Comment: Glycemic Control Guidelines for People with Diabetes:Non Diabetic:  <6%Goal of Therapy: <7%Additional Action Suggested:  >8%   Magnesium     Status: None   Collection Time: 03/02/18 10:33 AM  Result Value Ref Range   Magnesium 2.1 1.5 - 2.5 mg/dL  Vitamin D (25 hydroxy)     Status: Abnormal   Collection Time: 03/02/18 10:45 AM  Result Value Ref Range   VITD 16.13 (L) 30.00 - 100.00 ng/mL  Comprehensive metabolic panel     Status: Abnormal   Collection Time: 03/02/18  5:15 PM  Result Value Ref Range   Sodium 134 (L) 135 -  145 mmol/L   Potassium 3.0 (L) 3.5 - 5.1 mmol/L   Chloride 96 (L) 101 - 111 mmol/L   CO2 27 22 - 32 mmol/L   Glucose, Bld 129 (H) 65 - 99 mg/dL   BUN 14 6 - 20 mg/dL   Creatinine, Ser 0.83 0.44 - 1.00 mg/dL   Calcium 8.6 (L) 8.9 - 10.3 mg/dL   Total Protein 6.5 6.5 - 8.1 g/dL   Albumin 2.9 (L) 3.5 - 5.0 g/dL   AST 20 15 - 41 U/L   ALT 16 14 - 54 U/L   Alkaline Phosphatase 55 38 - 126 U/L   Total Bilirubin 1.3 (H) 0.3 - 1.2 mg/dL   GFR calc non Af Amer >60 >60 mL/min   GFR calc Af Amer >60 >60 mL/min    Comment: (NOTE) The eGFR has been calculated using the CKD EPI equation. This calculation has not been validated in all clinical situations. eGFR's persistently <60 mL/min signify possible Chronic Kidney Disease.    Anion gap 11 5 - 15    Comment: Performed at [facility], [address] [place], [place] [address]  CBC with Differential     Status: Abnormal   Collection Time: 03/02/18  5:15 PM  Result Value Ref Range   WBC 1.6 (L) 3.6 - 11.0 K/uL   RBC 4.07 3.80 - 5.20 MIL/uL   Hemoglobin 12.2 12.0 - 16.0 g/dL   HCT 35.5 35.0 - 47.0 %   MCV 87.3 80.0 - 100.0 fL   MCH 30.1 26.0 - 34.0 pg   MCHC 34.4 32.0 - 36.0 g/dL   RDW 15.6 (H) 11.5 - 14.5 %   Platelets 155 150 - 440 K/uL    Neutrophils Relative % 36 %   Neutro Abs 0.6 (L) 1.4 - 6.5 K/uL   Lymphocytes Relative 23 %   Lymphs Abs 0.4 (L) 1.0 - 3.6 K/uL   Monocytes Relative 16 %   Monocytes Absolute 0.3 0.2 - 0.9 K/uL   Eosinophils Relative 24 %   Eosinophils Absolute 0.4 0 - 0.7 K/uL   Basophils Relative 1 %   Basophils Absolute 0.0 0 - 0.1 K/uL    Comment: Performed at [facility], [address] [place], [place] [address]  Troponin I     Status: None   Collection Time: 03/02/18  5:15 PM  Result Value Ref Range   Troponin I <0.03 <0.03 ng/mL    Comment: Performed at [facility], [address] [place], [place] [address]  Procalcitonin - Baseline     Status: None   Collection Time: 03/02/18  5:15 PM  Result Value Ref Range   Procalcitonin 0.38 ng/mL    Comment:        Interpretation: PCT (Procalcitonin) <= 0.5 ng/mL: Systemic infection (sepsis) is not likely. Local bacterial infection is possible. (NOTE)       Sepsis PCT Algorithm           Lower Respiratory Tract                                      Infection PCT Algorithm    ----------------------------     ----------------------------         PCT < 0.25 ng/mL                PCT < 0.10 ng/mL         Strongly  encourage             Strongly discourage   discontinuation of antibiotics    initiation of antibiotics    ----------------------------     -----------------------------       PCT 0.25 - 0.50 ng/mL            PCT 0.10 - 0.25 ng/mL               OR       >80% decrease in PCT            Discourage initiation of                                            antibiotics      Encourage discontinuation           of antibiotics    ----------------------------     -----------------------------         PCT >= 0.50 ng/mL              PCT 0.26 - 0.50 ng/mL               AND        <80% decrease in PCT             Encourage initiation of                                             antibiotics       Encourage continuation            of antibiotics    ----------------------------     -----------------------------        PCT >= 0.50 ng/mL                  PCT > 0.50 ng/mL               AND         increase in PCT                  Strongly encourage                                      initiation of antibiotics    Strongly encourage escalation           of antibiotics                                     -----------------------------                                           PCT <= 0.25 ng/mL                                                 OR                                        >  80% decrease in PCT                                     Discontinue / Do not initiate                                             antibiotics Performed at [facility], [address] [place],  [place] [address]   Brain natriuretic peptide     Status: None   Collection Time: 03/02/18  5:18 PM  Result Value Ref Range   B Natriuretic Peptide 21.0 0.0 - 100.0 pg/mL    Comment: Performed at [facility], [address] [place], [place] [address]  Urinalysis, Routine w reflex microscopic     Status: Abnormal   Collection Time: 03/02/18  7:26 PM  Result Value Ref Range   Color, Urine YELLOW (A) YELLOW   APPearance CLEAR (A) CLEAR   Specific Gravity, Urine 1.006 1.005 - 1.030   pH 6.0 5.0 - 8.0   Glucose, UA NEGATIVE NEGATIVE mg/dL   Hgb urine dipstick SMALL (A) NEGATIVE   Bilirubin Urine NEGATIVE NEGATIVE   Ketones, ur NEGATIVE NEGATIVE mg/dL   Protein, ur NEGATIVE NEGATIVE mg/dL   Nitrite NEGATIVE NEGATIVE   Leukocytes, UA NEGATIVE NEGATIVE   RBC / HPF 0-5 0 - 5 RBC/hpf   WBC, UA 11-20 0 - 5 WBC/hpf   Bacteria, UA NONE SEEN NONE SEEN   Squamous Epithelial / LPF 0-5 0 - 5   Mucus PRESENT     Comment: Performed at [facility], [address] [place], [place] [address]  Blood Culture (routine x 2)     Status: None   Collection Time: 03/02/18  7:27 PM  Result Value Ref Range   Specimen Description  BLOOD L AC    Special Requests      BOTTLES DRAWN AEROBIC AND ANAEROBIC Blood Culture adequate volume   Culture      NO GROWTH 5 DAYS Performed at [facility], [address], [place], [place] [address]    Report Status 03/07/2018 FINAL   Blood Culture (routine x 2)     Status: None   Collection Time: 03/02/18  7:27 PM  Result Value Ref Range   Specimen Description BLOOD L H    Special Requests      BOTTLES DRAWN AEROBIC AND ANAEROBIC Blood Culture results may not be optimal due to an inadequate volume of blood received in culture bottles   Culture      NO GROWTH 5 DAYS Performed at [facility], [address] [place],  [address]    Report Status 03/07/2018 FINAL   Blood gas, venous ([facility], [facility], [facility])     Status: Abnormal   Collection Time: 03/02/18  7:27 PM  Result Value Ref Range   pH, Ven 7.47 (H) 7.250 - 7.430   pCO2, Ven 41 (L) 44.0 - 60.0 mmHg   Bicarbonate 29.8 (H) 20.0 - 28.0 mmol/L   Acid-Base Excess 5.6 (H) 0.0 - 2.0 mmol/L   Patient temperature 37.0    Collection site VENOUS    Sample type VENOUS     Comment: Performed at [facility], [address] [place], [place] [address]  Lactic acid, plasma     Status: Abnormal  Collection Time: 03/02/18  7:27 PM  Result Value Ref Range   Lactic Acid, Venous 2.2 (HH) 0.5 - 1.9 mmol/L    Comment: CRITICAL RESULT CALLED TO, READ BACK BY AND VERIFIED WITH DEVETTA MCCLAIN @ 2036 ON 03/02/2018 BY CAF Performed at [facility], [address] [place], [place] [address]   Lactic acid, plasma     Status: Abnormal   Collection Time: 03/02/18 10:21 PM  Result Value Ref Range   Lactic Acid, Venous 2.0 (HH) 0.5 - 1.9 mmol/L    Comment: CRITICAL RESULT CALLED TO, READ BACK BY AND VERIFIED WITH JEANNE MUTESI ON 03/02/18 AT [address] [facility] Performed at [facility], [address] [place], [place] [address]   Magnesium     Status: Abnormal   Collection Time: 03/02/18 10:21 PM   Result Value Ref Range   Magnesium 1.6 (L) 1.7 - 2.4 mg/dL    Comment: Performed at [facility], [address] [place], [place] [address]  Procalcitonin     Status: None   Collection Time: 03/02/18 10:21 PM  Result Value Ref Range   Procalcitonin 0.30 ng/mL    Comment:        Interpretation: PCT (Procalcitonin) <= 0.5 ng/mL: Systemic infection (sepsis) is not likely. Local bacterial infection is possible. (NOTE)       Sepsis PCT Algorithm           Lower Respiratory Tract                                      Infection PCT Algorithm    ----------------------------     ----------------------------         PCT < 0.25 ng/mL                PCT < 0.10 ng/mL         Strongly encourage             Strongly discourage   discontinuation of antibiotics    initiation of antibiotics    ----------------------------     -----------------------------       PCT 0.25 - 0.50 ng/mL            PCT 0.10 - 0.25 ng/mL               OR       >80% decrease in PCT            Discourage initiation of                                            antibiotics      Encourage discontinuation           of antibiotics    ----------------------------     -----------------------------         PCT >= 0.50 ng/mL              PCT 0.26 - 0.50 ng/mL               AND        <80% decrease in PCT             Encourage initiation of  antibiotics       Encourage continuation           of antibiotics    ----------------------------     -----------------------------        PCT >= 0.50 ng/mL                  PCT > 0.50 ng/mL               AND         increase in PCT                  Strongly encourage                                      initiation of antibiotics    Strongly encourage escalation           of antibiotics                                     -----------------------------                                           PCT <= 0.25 ng/mL                                                  OR                                        > 80% decrease in PCT                                     Discontinue / Do not initiate                                             antibiotics Performed at [facility], [address] [place], [place] [address]   Protime-INR     Status: Abnormal   Collection Time: 03/02/18 10:21 PM  Result Value Ref Range   Prothrombin Time 16.4 (H) 11.4 - 15.2 seconds   INR 1.33     Comment: Performed at [facility], [address] [place], [place] [address]  APTT     Status: Abnormal   Collection Time: 03/02/18 10:21 PM  Result Value Ref Range   aPTT 43 (H) 24 - 36 seconds    Comment:        IF BASELINE aPTT IS ELEVATED, SUGGEST PATIENT RISK ASSESSMENT BE USED TO DETERMINE APPROPRIATE ANTICOAGULANT THERAPY. Performed at [facility], [address] [place], [place] [address]   MRSA PCR Screening     Status: None   Collection Time: 03/02/18 10:22 PM  Result Value Ref Range   MRSA by PCR NEGATIVE NEGATIVE    Comment:  The GeneXpert MRSA Assay (FDA approved for NASAL specimens only), is one component of a comprehensive MRSA colonization surveillance program. It is not intended to diagnose MRSA infection nor to guide or monitor treatment for MRSA infections. Performed at [facility], [address] [place], [place] [address]   Procalcitonin     Status: None   Collection Time: 03/03/18  4:20 AM  Result Value Ref Range   Procalcitonin 0.31 ng/mL    Comment:        Interpretation: PCT (Procalcitonin) <= 0.5 ng/mL: Systemic infection (sepsis) is not likely. Local bacterial infection is possible. (NOTE)       Sepsis PCT Algorithm           Lower Respiratory Tract                                      Infection PCT Algorithm    ----------------------------     ----------------------------         PCT < 0.25 ng/mL                PCT < 0.10 ng/mL         Strongly encourage              Strongly discourage   discontinuation of antibiotics    initiation of antibiotics    ----------------------------     -----------------------------       PCT 0.25 - 0.50 ng/mL            PCT 0.10 - 0.25 ng/mL               OR       >80% decrease in PCT            Discourage initiation of                                            antibiotics      Encourage discontinuation           of antibiotics    ----------------------------     -----------------------------         PCT >= 0.50 ng/mL              PCT 0.26 - 0.50 ng/mL               AND        <80% decrease in PCT             Encourage initiation of                                             antibiotics       Encourage continuation           of antibiotics    ----------------------------     -----------------------------        PCT >= 0.50 ng/mL                  PCT > 0.50 ng/mL               AND         increase in PCT  Strongly encourage                                      initiation of antibiotics    Strongly encourage escalation           of antibiotics                                     -----------------------------                                           PCT <= 0.25 ng/mL                                                 OR                                        > 80% decrease in PCT                                     Discontinue / Do not initiate                                             antibiotics Performed at [facility], [address] [place], [place] [address]   Basic metabolic panel     Status: Abnormal   Collection Time: 03/03/18  4:20 AM  Result Value Ref Range   Sodium 138 135 - 145 mmol/L   Potassium 3.6 3.5 - 5.1 mmol/L   Chloride 108 101 - 111 mmol/L   CO2 25 22 - 32 mmol/L   Glucose, Bld 111 (H) 65 - 99 mg/dL   BUN 11 6 - 20 mg/dL   Creatinine, Ser 0.81 0.44 - 1.00 mg/dL   Calcium 8.0 (L) 8.9 - 10.3 mg/dL   GFR calc non Af Amer >60 >60 mL/min   GFR calc  Af Amer >60 >60 mL/min    Comment: (NOTE) The eGFR has been calculated using the CKD EPI equation. This calculation has not been validated in all clinical situations. eGFR's persistently <60 mL/min signify possible Chronic Kidney Disease.    Anion gap 5 5 - 15    Comment: Performed at [facility], [address] [place], [place] [address]  CBC     Status: Abnormal   Collection Time: 03/03/18  4:20 AM  Result Value Ref Range   WBC 1.5 (L) 3.6 - 11.0 K/uL   RBC 3.24 (L) 3.80 - 5.20 MIL/uL   Hemoglobin 9.7 (L) 12.0 - 16.0 g/dL    Comment: RESULT REPEATED AND VERIFIED   HCT 28.3 (L) 35.0 - 47.0 %   MCV 87.5 80.0 - 100.0 fL   MCH 30.1 26.0 - 34.0 pg   MCHC 34.4 32.0 - 36.0 g/dL   RDW 15.5 (H) 11.5 -  14.5 %   Platelets 119 (L) 150 - 440 K/uL    Comment: Performed at [facility], [address] [place], [place] [address]  Strep pneumoniae urinary antigen     Status: None   Collection Time: 03/03/18  4:49 PM  Result Value Ref Range   Strep Pneumo Urinary Antigen NEGATIVE NEGATIVE    Comment:        Infection due to S. pneumoniae cannot be absolutely ruled out since the antigen present may be below the detection limit of the test. Performed at [facility], [address], [place], [place] [address]   Procalcitonin     Status: None   Collection Time: 03/04/18  4:29 AM  Result Value Ref Range   Procalcitonin 0.20 ng/mL    Comment:        Interpretation: PCT (Procalcitonin) <= 0.5 ng/mL: Systemic infection (sepsis) is not likely. Local bacterial infection is possible. (NOTE)       Sepsis PCT Algorithm           Lower Respiratory Tract                                      Infection PCT Algorithm    ----------------------------     ----------------------------         PCT < 0.25 ng/mL                PCT < 0.10 ng/mL         Strongly encourage             Strongly discourage   discontinuation of antibiotics    initiation of antibiotics     ----------------------------     -----------------------------       PCT 0.25 - 0.50 ng/mL            PCT 0.10 - 0.25 ng/mL               OR       >80% decrease in PCT            Discourage initiation of                                            antibiotics      Encourage discontinuation           of antibiotics    ----------------------------     -----------------------------         PCT >= 0.50 ng/mL              PCT 0.26 - 0.50 ng/mL               AND        <80% decrease in PCT             Encourage initiation of                                             antibiotics       Encourage continuation           of antibiotics    ----------------------------     -----------------------------        PCT >=  0.50 ng/mL                  PCT > 0.50 ng/mL               AND         increase in PCT                  Strongly encourage                                      initiation of antibiotics    Strongly encourage escalation           of antibiotics                                     -----------------------------                                           PCT <= 0.25 ng/mL                                                 OR                                        > 80% decrease in PCT                                     Discontinue / Do not initiate                                             antibiotics Performed at [facility], [address] [place],  [address]   Comprehensive metabolic panel     Status: Abnormal   Collection Time: 03/09/18  8:14 AM  Result Value Ref Range   Sodium 140 135 - 145 mmol/L   Potassium 3.8 3.5 - 5.1 mmol/L   Chloride 101 101 - 111 mmol/L   CO2 26 22 - 32 mmol/L   Glucose, Bld 107 (H) 65 - 99 mg/dL   BUN 11 6 - 20 mg/dL   Creatinine, Ser 0.74 0.44 - 1.00 mg/dL   Calcium 9.1 8.9 - 10.3 mg/dL   Total Protein 6.1 (L) 6.5 - 8.1 g/dL   Albumin 2.6 (L) 3.5 - 5.0 g/dL   AST 17 15 - 41 U/L   ALT 12 (L) 14 - 54 U/L   Alkaline Phosphatase  57 38 - 126 U/L   Total Bilirubin 1.0 0.3 - 1.2 mg/dL   GFR calc non Af Amer >60 >60 mL/min   GFR calc Af Amer >60 >60 mL/min    Comment: (NOTE) The eGFR has been calculated using the CKD EPI equation. This calculation has not been validated in all clinical situations. eGFR's persistently <60 mL/min signify possible Chronic Kidney Disease.  Anion gap 13 5 - 15    Comment: Performed at [facility], [address] [place], [place] [address]  CBC with Differential     Status: Abnormal   Collection Time: 03/09/18  8:14 AM  Result Value Ref Range   WBC 2.6 (L) 3.6 - 11.0 K/uL   RBC 3.62 (L) 3.80 - 5.20 MIL/uL   Hemoglobin 10.8 (L) 12.0 - 16.0 g/dL   HCT 31.5 (L) 35.0 - 47.0 %   MCV 87.0 80.0 - 100.0 fL   MCH 29.7 26.0 - 34.0 pg   MCHC 34.2 32.0 - 36.0 g/dL   RDW 15.8 (H) 11.5 - 14.5 %   Platelets 371 150 - 440 K/uL   Neutrophils Relative % 21 %   Neutro Abs 0.5 (L) 1.4 - 6.5 K/uL   Lymphocytes Relative 30 %   Lymphs Abs 0.8 (L) 1.0 - 3.6 K/uL   Monocytes Relative 34 %   Monocytes Absolute 0.9 0.2 - 0.9 K/uL   Eosinophils Relative 14 %   Eosinophils Absolute 0.3 0 - 0.7 K/uL   Basophils Relative 1 %   Basophils Absolute 0.0 0 - 0.1 K/uL    Comment: Performed at [facility], [address] [place], [place] [address]  Comprehensive metabolic panel     Status: Abnormal   Collection Time: 03/16/18 11:15 AM  Result Value Ref Range   Sodium 141 135 - 145 mmol/L   Potassium 3.0 (L) 3.5 - 5.1 mmol/L   Chloride 104 101 - 111 mmol/L   CO2 25 22 - 32 mmol/L   Glucose, Bld 139 (H) 65 - 99 mg/dL   BUN 11 6 - 20 mg/dL   Creatinine, Ser 0.76 0.44 - 1.00 mg/dL   Calcium 9.3 8.9 - 10.3 mg/dL   Total Protein 6.2 (L) 6.5 - 8.1 g/dL   Albumin 2.9 (L) 3.5 - 5.0 g/dL   AST 21 15 - 41 U/L   ALT 14 14 - 54 U/L   Alkaline Phosphatase 72 38 - 126 U/L   Total Bilirubin 0.4 0.3 - 1.2 mg/dL   GFR calc non Af Amer >60 >60 mL/min   GFR calc Af Amer >60 >60 mL/min    Comment:  (NOTE) The eGFR has been calculated using the CKD EPI equation. This calculation has not been validated in all clinical situations. eGFR's persistently <60 mL/min signify possible Chronic Kidney Disease.    Anion gap 12 5 - 15    Comment: Performed at [facility], [address] [place], [place] [address]  CBC with Differential     Status: Abnormal   Collection Time: 03/16/18 11:15 AM  Result Value Ref Range   WBC 4.6 3.6 - 11.0 K/uL   RBC 3.84 3.80 - 5.20 MIL/uL   Hemoglobin 11.3 (L) 12.0 - 16.0 g/dL   HCT 34.0 (L) 35.0 - 47.0 %   MCV 88.6 80.0 - 100.0 fL   MCH 29.4 26.0 - 34.0 pg   MCHC 33.2 32.0 - 36.0 g/dL   RDW 16.1 (H) 11.5 - 14.5 %   Platelets 352 150 - 440 K/uL   Neutrophils Relative % 63 %   Neutro Abs 2.9 1.4 - 6.5 K/uL   Lymphocytes Relative 21 %   Lymphs Abs 1.0 1.0 - 3.6 K/uL   Monocytes Relative 10 %   Monocytes Absolute 0.4 0.2 - 0.9 K/uL   Eosinophils Relative 5 %   Eosinophils Absolute 0.2 0 - 0.7 K/uL   Basophils Relative  1 %   Basophils Absolute 0.1 0 - 0.1 K/uL    Comment: Performed at [facility], [address] [place], [place] [address]   Objective  Body mass index is 23.41 kg/m. Wt Readings from Last 3 Encounters:  03/18/18 128 lb (58.1 kg)  03/16/18 129 lb 8 oz (58.7 kg)  03/09/18 125 lb 11.2 oz (57 kg)   Temp Readings from Last 3 Encounters:  03/18/18 98 F (36.7 C) (Oral)  03/16/18 (!) 97.2 F (36.2 C) (Tympanic)  03/09/18 98.8 F (37.1 C) (Tympanic)   BP Readings from Last 3 Encounters:  03/18/18 (!) 106/56  03/16/18 (!) 102/57  03/09/18 100/61   Pulse Readings from Last 3 Encounters:  03/18/18 64  03/16/18 74  03/09/18 75    Physical Exam  Constitutional: She is oriented to person, place, and time. Vital signs are normal. She appears well-developed and well-nourished. She is cooperative.  HENT:  Head: Normocephalic and atraumatic.  Mouth/Throat: Oropharynx is clear and moist and mucous membranes are normal.   Eyes: Pupils are equal, round, and reactive to light. Conjunctivae are normal.  Cardiovascular: Normal rate, regular rhythm and normal heart sounds.  Pulmonary/Chest: Effort normal and breath sounds normal.  Neurological: She is alert and oriented to person, place, and time. Gait normal.  Skin: Skin is warm, dry and intact.  Psychiatric: She has a normal mood and affect. Her speech is normal and behavior is normal. Judgment and thought content normal. Cognition and memory are normal.  Nursing note and vitals reviewed.   Assessment   1. HFU for sepsis and HAP 2. hypoK 3. MM with chronic back pain. MM responding to tx per H/o notes  4. HM Plan  1. Repeat CXR end June early July 2019  2. Given high K list on oral K as well qd  3. F/u H/o [facility] and [facility]  4.  Had flu shot  Tdau 10/25/15  Need to check other vaccines shingrix, prevnar, pna 23  -will need pneumonia vaccines if has not had   mammo ordered pt needs to call to sch  Colonoscopy per pt had 3-4 years ago rectal bleeding h/o sigmoid colon resection  Pap sp hysterectomy endometrosis DEXA will disc in future per pt had 06/2014 osteopenia will need to check vit D in future -per pt had [facility] 2019 need to get records sent release again today  F/u [facility] H/O Dr. Amalia Hailey 03/10/18 disc Zometa start     Provider: Dr. Olivia Mackie McLean-Scocuzza-Internal Medicine

## 2018-03-18 NOTE — Patient Instructions (Addendum)
Try premier protein shakes 2 x per day for nutrition  Call to schedule your mammogram  Come here for CXR (chest Xray end June early July)  Take care  Follow u pin 3-4 months otherwise    Hypokalemia Hypokalemia means that the amount of potassium in the blood is lower than normal.Potassium is a chemical that helps regulate the amount of fluid in the body (electrolyte). It also stimulates muscle tightening (contraction) and helps nerves work properly.Normally, most of the body's potassium is inside of cells, and only a very small amount is in the blood. Because the amount in the blood is so small, minor changes to potassium levels in the blood can be life-threatening. What are the causes? This condition may be caused by:  Antibiotic medicine.  Diarrhea or vomiting. Taking too much of a medicine that helps you have a bowel movement (laxative) can cause diarrhea and lead to hypokalemia.  Chronic kidney disease (CKD).  Medicines that help the body get rid of excess fluid (diuretics).  Eating disorders, such as bulimia.  Low magnesium levels in the body.  Sweating a lot.  What are the signs or symptoms? Symptoms of this condition include:  Weakness.  Constipation.  Fatigue.  Muscle cramps.  Mental confusion.  Skipped heartbeats or irregular heartbeat (palpitations).  Tingling or numbness.  How is this diagnosed? This condition is diagnosed with a blood test. How is this treated? Hypokalemia can be treated by taking potassium supplements by mouth or adjusting the medicines that you take. Treatment may also include eating more foods that contain a lot of potassium. If your potassium level is very low, you may need to get potassium through an IV tube in one of your veins and be monitored in the hospital. Follow these instructions at home:  Take over-the-counter and prescription medicines only as told by your health care provider. This includes vitamins and  supplements.  Eat a healthy diet. A healthy diet includes fresh fruits and vegetables, whole grains, healthy fats, and lean proteins.  If instructed, eat more foods that contain a lot of potassium, such as: ? Nuts, such as peanuts and pistachios. ? Seeds, such as sunflower seeds and pumpkin seeds. ? Peas, lentils, and lima beans. ? Whole grain and bran cereals and breads. ? Fresh fruits and vegetables, such as apricots, avocado, bananas, cantaloupe, kiwi, oranges, tomatoes, asparagus, and potatoes. ? Orange juice. ? Tomato juice. ? Red meats. ? Yogurt.  Keep all follow-up visits as told by your health care provider. This is important. Contact a health care provider if:  You have weakness that gets worse.  You feel your heart pounding or racing.  You vomit.  You have diarrhea.  You have diabetes (diabetes mellitus) and you have trouble keeping your blood sugar (glucose) in your target range. Get help right away if:  You have chest pain.  You have shortness of breath.  You have vomiting or diarrhea that lasts for more than 2 days.  You faint. This information is not intended to replace advice given to you by your health care provider. Make sure you discuss any questions you have with your health care provider. Document Released: 10/06/2005 Document Revised: 05/24/2016 Document Reviewed: 05/24/2016 Elsevier Interactive Patient Education  2018 [employer].

## 2018-03-19 LAB — MULTIPLE MYELOMA PANEL, SERUM
ALPHA2 GLOB SERPL ELPH-MCNC: 1.1 g/dL — AB (ref 0.4–1.0)
Albumin SerPl Elph-Mcnc: 2.9 g/dL (ref 2.9–4.4)
Albumin/Glob SerPl: 1.1 (ref 0.7–1.7)
Alpha 1: 0.3 g/dL (ref 0.0–0.4)
B-GLOBULIN SERPL ELPH-MCNC: 0.9 g/dL (ref 0.7–1.3)
GAMMA GLOB SERPL ELPH-MCNC: 0.4 g/dL (ref 0.4–1.8)
GLOBULIN, TOTAL: 2.8 g/dL (ref 2.2–3.9)
IGG (IMMUNOGLOBIN G), SERUM: 498 mg/dL — AB (ref 700–1600)
IgA: 11 mg/dL — ABNORMAL LOW (ref 87–352)
IgM (Immunoglobulin M), Srm: 15 mg/dL — ABNORMAL LOW (ref 26–217)
M PROTEIN SERPL ELPH-MCNC: 0.1 g/dL — AB
Total Protein ELP: 5.7 g/dL — ABNORMAL LOW (ref 6.0–8.5)

## 2018-03-23 ENCOUNTER — Other Ambulatory Visit: Payer: Self-pay

## 2018-03-23 ENCOUNTER — Inpatient Hospital Stay: Payer: Medicare PPO

## 2018-03-23 ENCOUNTER — Inpatient Hospital Stay: Payer: Medicare PPO | Attending: Oncology

## 2018-03-23 ENCOUNTER — Inpatient Hospital Stay (HOSPITAL_BASED_OUTPATIENT_CLINIC_OR_DEPARTMENT_OTHER): Payer: Medicare PPO | Admitting: Oncology

## 2018-03-23 ENCOUNTER — Encounter: Payer: Self-pay | Admitting: Oncology

## 2018-03-23 VITALS — BP 138/68 | HR 70 | Temp 97.8°F | Ht 62.0 in | Wt 132.1 lb

## 2018-03-23 VITALS — BP 140/80 | HR 82 | Temp 95.9°F | Resp 18

## 2018-03-23 DIAGNOSIS — Z5111 Encounter for antineoplastic chemotherapy: Secondary | ICD-10-CM

## 2018-03-23 DIAGNOSIS — D702 Other drug-induced agranulocytosis: Secondary | ICD-10-CM | POA: Insufficient documentation

## 2018-03-23 DIAGNOSIS — C7951 Secondary malignant neoplasm of bone: Secondary | ICD-10-CM | POA: Insufficient documentation

## 2018-03-23 DIAGNOSIS — E785 Hyperlipidemia, unspecified: Secondary | ICD-10-CM | POA: Insufficient documentation

## 2018-03-23 DIAGNOSIS — R05 Cough: Secondary | ICD-10-CM

## 2018-03-23 DIAGNOSIS — E559 Vitamin D deficiency, unspecified: Secondary | ICD-10-CM | POA: Diagnosis not present

## 2018-03-23 DIAGNOSIS — R0602 Shortness of breath: Secondary | ICD-10-CM | POA: Insufficient documentation

## 2018-03-23 DIAGNOSIS — Z87891 Personal history of nicotine dependence: Secondary | ICD-10-CM | POA: Insufficient documentation

## 2018-03-23 DIAGNOSIS — Z79899 Other long term (current) drug therapy: Secondary | ICD-10-CM | POA: Insufficient documentation

## 2018-03-23 DIAGNOSIS — R5383 Other fatigue: Secondary | ICD-10-CM | POA: Diagnosis not present

## 2018-03-23 DIAGNOSIS — T451X5S Adverse effect of antineoplastic and immunosuppressive drugs, sequela: Secondary | ICD-10-CM | POA: Insufficient documentation

## 2018-03-23 DIAGNOSIS — C9002 Multiple myeloma in relapse: Secondary | ICD-10-CM

## 2018-03-23 DIAGNOSIS — I10 Essential (primary) hypertension: Secondary | ICD-10-CM | POA: Diagnosis not present

## 2018-03-23 DIAGNOSIS — Z5112 Encounter for antineoplastic immunotherapy: Secondary | ICD-10-CM | POA: Diagnosis not present

## 2018-03-23 DIAGNOSIS — J440 Chronic obstructive pulmonary disease with acute lower respiratory infection: Secondary | ICD-10-CM | POA: Insufficient documentation

## 2018-03-23 DIAGNOSIS — Z803 Family history of malignant neoplasm of breast: Secondary | ICD-10-CM | POA: Insufficient documentation

## 2018-03-23 DIAGNOSIS — J449 Chronic obstructive pulmonary disease, unspecified: Secondary | ICD-10-CM | POA: Diagnosis not present

## 2018-03-23 DIAGNOSIS — R5381 Other malaise: Secondary | ICD-10-CM | POA: Diagnosis not present

## 2018-03-23 DIAGNOSIS — Z7901 Long term (current) use of anticoagulants: Secondary | ICD-10-CM

## 2018-03-23 DIAGNOSIS — I82621 Acute embolism and thrombosis of deep veins of right upper extremity: Secondary | ICD-10-CM | POA: Diagnosis not present

## 2018-03-23 DIAGNOSIS — K219 Gastro-esophageal reflux disease without esophagitis: Secondary | ICD-10-CM

## 2018-03-23 DIAGNOSIS — E876 Hypokalemia: Secondary | ICD-10-CM | POA: Insufficient documentation

## 2018-03-23 LAB — COMPREHENSIVE METABOLIC PANEL
ALBUMIN: 3.2 g/dL — AB (ref 3.5–5.0)
ALT: 16 U/L (ref 14–54)
ANION GAP: 11 (ref 5–15)
AST: 22 U/L (ref 15–41)
Alkaline Phosphatase: 75 U/L (ref 38–126)
BILIRUBIN TOTAL: 0.8 mg/dL (ref 0.3–1.2)
BUN: 14 mg/dL (ref 6–20)
CO2: 24 mmol/L (ref 22–32)
Calcium: 9.6 mg/dL (ref 8.9–10.3)
Chloride: 106 mmol/L (ref 101–111)
Creatinine, Ser: 0.7 mg/dL (ref 0.44–1.00)
GFR calc Af Amer: >60 mL/min (ref >60)
GFR calc non Af Amer: >60 mL/min (ref >60)
GLUCOSE: 137 mg/dL — AB (ref 65–99)
POTASSIUM: 3.8 mmol/L (ref 3.5–5.1)
SODIUM: 141 mmol/L (ref 135–145)
TOTAL PROTEIN: 6.2 g/dL — AB (ref 6.5–8.1)

## 2018-03-23 LAB — CBC WITH DIFFERENTIAL/PLATELET
Basophils Absolute: 0.1 10*3/uL (ref 0–0.1)
Basophils Relative: 1 %
EOS PCT: 3 %
Eosinophils Absolute: 0.2 10*3/uL (ref 0–0.7)
HEMATOCRIT: 34.6 % — AB (ref 35.0–47.0)
Hemoglobin: 11.6 g/dL — ABNORMAL LOW (ref 12.0–16.0)
LYMPHS ABS: 0.8 10*3/uL — AB (ref 1.0–3.6)
LYMPHS PCT: 14 %
MCH: 29.9 pg (ref 26.0–34.0)
MCHC: 33.6 g/dL (ref 32.0–36.0)
MCV: 89.2 fL (ref 80.0–100.0)
MONO ABS: 0.8 10*3/uL (ref 0.2–0.9)
Monocytes Relative: 14 %
NEUTROS ABS: 3.7 10*3/uL (ref 1.4–6.5)
Neutrophils Relative %: 68 %
PLATELETS: 207 10*3/uL (ref 150–440)
RBC: 3.88 MIL/uL (ref 3.80–5.20)
RDW: 16.9 % — AB (ref 11.5–14.5)
WBC: 5.4 10*3/uL (ref 3.6–11.0)

## 2018-03-23 LAB — MAGNESIUM: Magnesium: 1.8 mg/dL (ref 1.7–2.4)

## 2018-03-23 MED ORDER — SODIUM CHLORIDE 0.9 % IV SOLN: 16.0000 mg/kg | Freq: Once | INTRAVENOUS | Status: DC

## 2018-03-23 MED ORDER — SODIUM CHLORIDE 0.9 % IV SOLN
900.0000 mg | Freq: Once | INTRAVENOUS | Status: AC
  Administered 2018-03-23: 900 mg via INTRAVENOUS
  Filled 2018-03-23: qty 40

## 2018-03-23 MED ORDER — PROCHLORPERAZINE MALEATE 10 MG PO TABS
10.0000 mg | ORAL_TABLET | Freq: Once | ORAL | Status: AC
  Administered 2018-03-23: 10 mg via ORAL
  Filled 2018-03-23: qty 1

## 2018-03-23 MED ORDER — POMALIDOMIDE 3 MG PO CAPS: 3.0000 mg | ORAL_CAPSULE | Freq: Every day | ORAL | 3 refills | Status: DC

## 2018-03-23 MED ORDER — ACETAMINOPHEN 325 MG PO TABS
650.0000 mg | ORAL_TABLET | Freq: Once | ORAL | Status: AC
  Administered 2018-03-23: 650 mg via ORAL
  Filled 2018-03-23: qty 2

## 2018-03-23 MED ORDER — SODIUM CHLORIDE 0.9 % IV SOLN
Freq: Once | INTRAVENOUS | Status: AC
  Administered 2018-03-23: 10:00:00 via INTRAVENOUS
  Filled 2018-03-23: qty 1000

## 2018-03-23 MED ORDER — DIPHENHYDRAMINE HCL 25 MG PO CAPS
50.0000 mg | ORAL_CAPSULE | Freq: Once | ORAL | Status: AC
Start: 2018-03-23 — End: 2018-03-23
  Administered 2018-03-23: 50 mg via ORAL
  Filled 2018-03-23: qty 2

## 2018-03-23 MED ORDER — SODIUM CHLORIDE 0.9 % IV SOLN
20.0000 mg | Freq: Once | INTRAVENOUS | Status: AC
  Administered 2018-03-23: 20 mg via INTRAVENOUS
  Filled 2018-03-23: qty 2

## 2018-03-23 NOTE — Progress Notes (Signed)
Patient here today for follow up and chemotherapy.  

## 2018-03-23 NOTE — Progress Notes (Signed)
Hematology/Oncology Follow up note [facility] [phone] [phone]   Patient Care Team: McLean-Scocuzza, Nino Glow, MD as PCP - General (Internal Medicine)  REFERRING PROVIDER: Dr. Baruch Gouty, Donette  REASON FOR VISIT Follow up for treatment of multiple myeloma.   HISTORY OF PRESENTING ILLNESS:  Cassie Jones is a  72 y.o.  female with PMH listed below who was referred to me for evaluation of multiple myeloma.  Patient used to follow-up with local oncologist Dr. Tonia Brooms and [facility] oncologist Dr. Amalia Hailey.  Patient tells me that as her husband works every day and has a busy working schedule, she is not able to get transportation to her treatment.  As an alternative she is now living with her brother who is close to [facility] [place] and want to transfer care here. Extensive medical record review was performed.    Patient was diagnosed with IgG lambda, ISS2, t (4,14) multiple myeloma on February 03, 2017, she has hemoglobin of 9, calcium 8.9, LDH 132, beta microglobulin 4.6, albumin 3.2.  Skeletal survey negative.  PET scan with single nonspecific humeral lesion and a thyroid nodule.  Marrow with 60% plasmacytosis by CD138 IHC, FISH with hyperdiploidy t(4, 14), 1 q. gain.. Per note, she was treated with CyborD (M spike 3.6 at start)  from 01/2017 to 02/2017. Her treatment was switched to Revlimid Velcade dexamethasone from 02/2017, M spike 1.2 at start,and was found to have progression of disease in 09/2017, when her M spike increased to 1.8, and developed compression fracture in 10/2016. She was restarted on RVD using weekly Velcade.  She was recently seen by Dr. Boris Sharper and recommended to start on a combination of daratumumab, Pomalyst, dexamethasone. Per patient she received her first daratumumab last week with split dose 40m/kg given on 3/14 and 3/15.  Patient reports that she tolerate the treatment well except mild infusion  reaction.  # Patient also reports that she has got dental clearance and has been given 1 dose of Zometa by Dr. VRemus Loffler(unkown dates).  [facility]  # Patient was seen by nurse practitioner last week prior to her cycle 2 daratumumab.  She reports feeling shortness of breath, having cough, believed to have bronchitis and was prescribed Levaquin 500 mg p.o. daily for 7 days, and albuterol as needed.  She also had a VQ scan low probability for pulmonary embolism.  # 01/29/2018 5th weekly treatment of Daratumumab,  Cycle 2 Pomalyst was started for a week and was nterrupted due to acute bronchitis treated with outpatient Azithromycin.    # 02/16/2018 developed right upper extremity swelling and tenderness, confirmed to be DVT. She was taking Aspirin.  Started on Eliquis. Aspirin is stopped.  # 02/16/2018 6th Daratumumab treatment. Was started on Cycle 3 Pomalyst, she has completed about 2 weeks treatment. She was admitted to hospital on 02/21/2018 for Klebsiella UTI and oncology was not consulted. She was treatment with antibiotics and continued on Pomalyst. She was hospitalized again on 03/02/2018 for pneumonia, COPD exacerbation/emphasema and oncology was consulted and patient was seen by my colleague Dr.Finnergan and advise patient to stop Pomalyst.   # [facility] recommends dose reduced Pomalyst 391m This was previously discussed with patient, and she was reluctant to be restarted on Pomalyst. Today she seems to be more acceptable for restarting Pomalyst, but requests to start in a few weeks so that she can  have a nice break.    INTERVAL HISTORY Cassie Jones is a 72 y.o. female who has above history reviewed by me today presents for follow up visit for management of multiple myeloma. Since last visit , she has felt much better. Her cough and SOB have improved.  Still has fatigue.   Data  reviewed:  Multiple myeloma labs obtained at last visit:  SPEP showed VGPR with >90% reduction of her M protein. Normalized free light chain ratio.   Current Treatment  # 3/14, 3/15 at outside facility, she got split dose for first treatment of daratumumab. #  01/08/2018 Start on weekly daratumumab '16mg'$ /m2, dexamethasone '20mg'$ ,  S/p # Palliative RT to spine. 01/08/2018 Cycle 1 Pomalyst '4mg'$  Day 1-21 ogf 28 day cycle.   Current Pain medication: Oxycodone '5mg'$  Q8h as needed. Oxycontin '10mg'$  Q12h.   Review of Systems  Constitutional: Positive for malaise/fatigue. Negative for chills, fever and weight loss.  HENT: Negative for congestion, ear discharge, ear pain, hearing loss, nosebleeds, sinus pain and sore throat.   Eyes: Negative for blurred vision, double vision, photophobia, pain, discharge and redness.  Respiratory: Negative for cough, hemoptysis, sputum production, shortness of breath and wheezing.   Cardiovascular: Negative for chest pain, palpitations, orthopnea, claudication and leg swelling.  Gastrointestinal: Negative for abdominal pain, blood in stool, constipation, diarrhea, heartburn, melena, nausea and vomiting.  Genitourinary: Negative for dysuria, flank pain, frequency, hematuria and urgency.  Musculoskeletal: Negative for back pain, joint pain, myalgias and neck pain.  Skin: Negative for itching and rash.  Neurological: Negative for dizziness, tingling, tremors, sensory change, speech change, focal weakness, weakness and headaches.  Endo/Heme/Allergies: Negative for environmental allergies. Does not bruise/bleed easily.  Psychiatric/Behavioral: Negative for depression, hallucinations, substance abuse and suicidal ideas. The patient is not nervous/anxious.     MEDICAL HISTORY:  Past Medical History:  Diagnosis Date  . Ascites   . Asthma   . Bilateral leg edema   . Cancer ([place])   . Chicken pox   . Colon polyps   . Diverticulitis    with perforation 02/2017 and hosp with  colostomy bag s/p removal   . Drug-induced neutropenia ([place]) 03/09/2018  . GERD (gastroesophageal reflux disease)   . Hyperlipidemia   . Hypertension   . Multiple myeloma ([place])    dx'ed 01/2018 follows Dr. Tasia Catchings and [facility] H/O   . Pleural effusion   . Pneumonia    03/02/18   . Thyroid nodule   . UTI (urinary tract infection)   . Vitamin D deficiency     SURGICAL HISTORY: Past Surgical History:  Procedure Laterality Date  . ABDOMINAL HYSTERECTOMY    . BREAST BIOPSY Bilateral yrs ago   benign  . CHOLECYSTECTOMY    . COLOSTOMY  2018  . COLOSTOMY REVERSAL     11 2018  . KYPHOPLASTY     11/2017 [place]     SOCIAL HISTORY: Social History   Socioeconomic History  . Marital status: Married    Spouse name: Not on file  . Number of children: Not on file  . Years of education: Not on file  . Highest education level: Not on file  Occupational History  . Not on file  Social Needs  . Financial resource strain: Not hard at all  . Food insecurity:    Worry: Never true    Inability: Never true  . Transportation needs:    Medical: No    Non-medical: No  Tobacco Use  . Smoking status: Former Research scientist (life sciences)  .  Smokeless tobacco: Never Used  Substance and Sexual Activity  . Alcohol use: Not Currently  . Drug use: Never  . Sexual activity: Yes    Comment: husband   Lifestyle  . Physical activity:    Days per week: 0 days    Minutes per session: 0 min  . Stress: Not at all  Relationships  . Social connections:    Talks on phone: More than three times a week    Gets together: Once a week    Attends religious service: 1 to 4 times per year    Active member of club or organization: No    Attends meetings of clubs or organizations: Never    Relationship status: Married  . Intimate partner violence:    Fear of current or ex partner: No    Emotionally abused: No    Physically abused: No    Forced sexual activity: No  Other Topics Concern  . Not on file  Social History Narrative    Married    Husband lives in [place] while she gets treatment in this area    She lives alone in this area with family close by     FAMILY HISTORY: Family History  Problem Relation Age of Onset  . Breast cancer Mother 36  . Cancer Mother        breast   . Diabetes Mother   . Heart disease Mother   . Hyperlipidemia Mother   . Hypertension Mother   . Birth defects Brother   . Diabetes Brother   . Heart disease Brother   . Hyperlipidemia Brother   . Cancer Father        prostate met to liver     ALLERGIES:  is allergic to codeine.  MEDICATIONS:  Current Outpatient Medications  Medication Sig Dispense Refill  . acetaminophen (TYLENOL) 500 MG tablet Take 1,000 mg by mouth every 6 (six) hours as needed for mild pain or fever.    Marland Kitchen albuterol (VENTOLIN HFA) 108 (90 Base) MCG/ACT inhaler Inhale 2 puffs into the lungs every 6 (six) hours as needed. 1 Inhaler 1  . apixaban (ELIQUIS) 5 MG TABS tablet Take 2 tablets ('10mg'$ ) twice a day for 7 days. Take 1 tablet ('5mg'$ ) twice daily after first week. 70 tablet 0  . Cholecalciferol 50000 units capsule Take 1 capsule (50,000 Units total) by mouth once a week. 13 capsule 1  . feeding supplement, ENSURE ENLIVE, (ENSURE ENLIVE) LIQD Take 237 mLs by mouth 3 (three) times daily between meals. 237 mL 12  . fluticasone (FLONASE) 50 MCG/ACT nasal spray Place 1-2 sprays into both nostrils daily. 16 g 2  . fluticasone (FLOVENT DISKUS) 50 MCG/BLIST diskus inhaler Inhale 1 puff into the lungs 2 (two) times daily. 1 Inhaler 3  . furosemide (LASIX) 20 MG tablet Take 1 tablet (20 mg total) by mouth daily as needed. 30 tablet 2  . loperamide (IMODIUM A-D) 2 MG tablet Take 2 mg by mouth 4 (four) times daily as needed for diarrhea or loose stools.    . montelukast (SINGULAIR) 10 MG tablet Take 10 mg by mouth at bedtime.    . ondansetron (ZOFRAN) 8 MG tablet Take 8 mg by mouth every 8 (eight) hours as needed for nausea.    Marland Kitchen oxyCODONE (OXY IR/ROXICODONE) 5 MG  immediate release tablet Take 5 mg by mouth 3 (three) times daily as needed for moderate pain.    Marland Kitchen oxyCODONE (OXYCONTIN) 10 mg 12 hr tablet Take 10  mg by mouth every 12 (twelve) hours as needed (severe pain).    . pantoprazole (PROTONIX) 40 MG tablet Take 1 tablet by mouth every morning.    . potassium chloride SA (K-DUR,KLOR-CON) 20 MEQ tablet Take 1 tablet (20 mEq total) by mouth daily. 14 tablet 0  . pravastatin (PRAVACHOL) 10 MG tablet Take 1 tablet by mouth at bedtime.    . prochlorperazine (COMPAZINE) 10 MG tablet Take 10 mg by mouth every 6 (six) hours as needed for nausea.    . pomalidomide (POMALYST) 3 MG capsule Take 1 capsule (3 mg total) by mouth daily. Take with water on days 1-21. Repeat every 28 days. 21 capsule 3   No current facility-administered medications for this visit.      PHYSICAL EXAMINATION: ECOG PERFORMANCE STATUS: 2 - Symptomatic, <50% confined to bed Vitals:   03/23/18 1028  BP: 138/68  Pulse: 70  Temp: 97.8 F (36.6 C)   Filed Weights   03/23/18 1028  Weight: 132 lb 2 oz (59.9 kg)    Physical Exam  Constitutional: She is oriented to person, place, and time and well-developed, well-nourished, and in no distress. No distress.  HENT:  Head: Normocephalic and atraumatic.  Nose: Nose normal.  Mouth/Throat: Oropharynx is clear and moist. No oropharyngeal exudate.  Eyes: Pupils are equal, round, and reactive to light. EOM are normal. Left eye exhibits no discharge. No scleral icterus.  Neck: Normal range of motion. Neck supple. No JVD present. No tracheal deviation present.  Cardiovascular: Normal rate, regular rhythm and normal heart sounds.  No murmur heard. Pulmonary/Chest: Effort normal and breath sounds normal. No respiratory distress. She has no wheezes. She has no rales. She exhibits no tenderness.  Decreased breath sounds bilaterally.   Abdominal: Soft. She exhibits no distension and no mass. There is no tenderness. There is no rebound and no  guarding.  Musculoskeletal: Normal range of motion. She exhibits no edema or tenderness.  Lymphadenopathy:    She has no cervical adenopathy.  Neurological: She is alert and oriented to person, place, and time. No cranial nerve deficit. She exhibits normal muscle tone. Coordination normal.  Skin: Skin is warm and dry. No rash noted. She is not diaphoretic. No erythema. No pallor.  Psychiatric: Memory, affect and judgment normal.     LABORATORY DATA:  I have reviewed the data as listed Lab Results  Component Value Date   WBC 5.4 03/23/2018   HGB 11.6 (L) 03/23/2018   HCT 34.6 (L) 03/23/2018   MCV 89.2 03/23/2018   PLT 207 03/23/2018   Recent Labs    03/09/18 0814 03/16/18 1115 03/23/18 0841  NA 140 141 141  K 3.8 3.0* 3.8  CL 101 104 106  CO2 '26 25 24  '$ GLUCOSE 107* 139* 137*  BUN '11 11 14  '$ CREATININE 0.74 0.76 0.70  CALCIUM 9.1 9.3 9.6  GFRNONAA >60 >60 >60  GFRAA >60 >60 >60  PROT 6.1* 6.2* 6.2*  ALBUMIN 2.6* 2.9* 3.2*  AST '17 21 22  '$ ALT 12* 14 16  ALKPHOS 57 72 75  BILITOT 1.0 0.4 0.8       ASSESSMENT & PLAN:  1. Multiple myeloma in relapse ([place])   2. Drug-induced neutropenia ([facility])   3. Hypokalemia   4. Bone metastasis ([place])   5. Encounter for antineoplastic chemotherapy   6. Acute deep vein thrombosis (DVT) of ulnar vein of right upper extremity ([place])    #Multiple myeloma : I have reviewed the lab work which was  obtained last week and has discussed with patient that she has had very good response to current treatment. Tolerate Daratumumab well.  Proceed with weekly daratumumab today. She will now start to have Daratumumab every 2 weeks.  Future plan was discussed with patient. Plan restart Pomalyst at a reduced dose of '3mg'$  D1-21 Q28d in 2 weeks.  If she continues to be neutropenic despite pomalyst dose reduced to '3mg'$ , will give G-CSF support. Patient voices understanding and agree with plan.  I discussed about bone marrow transplant evaluation after she  achieves complete remission, she tells that she does not want bone marrow transplant.   # Right upper extremity DVT, provoked due to being on Pomalyst. Continue Eliquis '5mg'$  BID.  # Bone Metastasis: discussed with patient again about obtaining dental clearance for starting Zometa.  # Drug induced neutropenia: improved. Monitor.   All questions were answered. The patient knows to call the clinic with any problems questions or concerns. Return of visit: 2 weeks for assessment prior to  daratumumab and start of pomalyst.  Total face to face encounter time for this patient visit was 25 min. >50% of the time was  spent in counseling and coordination of care.   Earlie Server, MD, PhD Hematology Oncology [facility] Pager- [phone] 03/23/2018

## 2018-04-06 ENCOUNTER — Inpatient Hospital Stay: Payer: Medicare PPO

## 2018-04-06 ENCOUNTER — Encounter: Payer: Self-pay | Admitting: Oncology

## 2018-04-06 ENCOUNTER — Inpatient Hospital Stay (HOSPITAL_BASED_OUTPATIENT_CLINIC_OR_DEPARTMENT_OTHER): Payer: Medicare PPO | Admitting: Oncology

## 2018-04-06 ENCOUNTER — Telehealth: Payer: Self-pay | Admitting: *Deleted

## 2018-04-06 ENCOUNTER — Other Ambulatory Visit: Payer: Self-pay

## 2018-04-06 VITALS — BP 131/74 | HR 65 | Temp 97.4°F | Resp 18 | Wt 132.6 lb

## 2018-04-06 DIAGNOSIS — K219 Gastro-esophageal reflux disease without esophagitis: Secondary | ICD-10-CM

## 2018-04-06 DIAGNOSIS — R5381 Other malaise: Secondary | ICD-10-CM

## 2018-04-06 DIAGNOSIS — I82621 Acute embolism and thrombosis of deep veins of right upper extremity: Secondary | ICD-10-CM | POA: Diagnosis not present

## 2018-04-06 DIAGNOSIS — E876 Hypokalemia: Secondary | ICD-10-CM | POA: Diagnosis not present

## 2018-04-06 DIAGNOSIS — Z87891 Personal history of nicotine dependence: Secondary | ICD-10-CM

## 2018-04-06 DIAGNOSIS — I10 Essential (primary) hypertension: Secondary | ICD-10-CM | POA: Diagnosis not present

## 2018-04-06 DIAGNOSIS — J449 Chronic obstructive pulmonary disease, unspecified: Secondary | ICD-10-CM

## 2018-04-06 DIAGNOSIS — Z7901 Long term (current) use of anticoagulants: Secondary | ICD-10-CM

## 2018-04-06 DIAGNOSIS — C9002 Multiple myeloma in relapse: Secondary | ICD-10-CM | POA: Diagnosis not present

## 2018-04-06 DIAGNOSIS — T451X5S Adverse effect of antineoplastic and immunosuppressive drugs, sequela: Secondary | ICD-10-CM | POA: Diagnosis not present

## 2018-04-06 DIAGNOSIS — E785 Hyperlipidemia, unspecified: Secondary | ICD-10-CM | POA: Diagnosis not present

## 2018-04-06 DIAGNOSIS — Z803 Family history of malignant neoplasm of breast: Secondary | ICD-10-CM

## 2018-04-06 DIAGNOSIS — R5383 Other fatigue: Secondary | ICD-10-CM

## 2018-04-06 DIAGNOSIS — Z5111 Encounter for antineoplastic chemotherapy: Secondary | ICD-10-CM

## 2018-04-06 DIAGNOSIS — Z5112 Encounter for antineoplastic immunotherapy: Secondary | ICD-10-CM | POA: Diagnosis not present

## 2018-04-06 DIAGNOSIS — E559 Vitamin D deficiency, unspecified: Secondary | ICD-10-CM

## 2018-04-06 DIAGNOSIS — D702 Other drug-induced agranulocytosis: Secondary | ICD-10-CM | POA: Diagnosis not present

## 2018-04-06 DIAGNOSIS — Z79899 Other long term (current) drug therapy: Secondary | ICD-10-CM

## 2018-04-06 DIAGNOSIS — C7951 Secondary malignant neoplasm of bone: Secondary | ICD-10-CM

## 2018-04-06 LAB — CBC WITH DIFFERENTIAL/PLATELET
Basophils Absolute: 0 10*3/uL (ref 0–0.1)
Basophils Relative: 0 %
EOS ABS: 0.2 10*3/uL (ref 0–0.7)
EOS PCT: 5 %
HCT: 36.3 % (ref 35.0–47.0)
Hemoglobin: 12.4 g/dL (ref 12.0–16.0)
LYMPHS PCT: 16 %
Lymphs Abs: 0.9 10*3/uL — ABNORMAL LOW (ref 1.0–3.6)
MCH: 30.2 pg (ref 26.0–34.0)
MCHC: 34.1 g/dL (ref 32.0–36.0)
MCV: 88.7 fL (ref 80.0–100.0)
MONOS PCT: 13 %
Monocytes Absolute: 0.7 10*3/uL (ref 0.2–0.9)
Neutro Abs: 3.5 10*3/uL (ref 1.4–6.5)
Neutrophils Relative %: 66 %
PLATELETS: 164 10*3/uL (ref 150–440)
RBC: 4.09 MIL/uL (ref 3.80–5.20)
RDW: 16.3 % — ABNORMAL HIGH (ref 11.5–14.5)
WBC: 5.3 10*3/uL (ref 3.6–11.0)

## 2018-04-06 LAB — COMPREHENSIVE METABOLIC PANEL
ALT: 17 U/L (ref 14–54)
AST: 17 U/L (ref 15–41)
Albumin: 3.6 g/dL (ref 3.5–5.0)
Alkaline Phosphatase: 74 U/L (ref 38–126)
Anion gap: 8 (ref 5–15)
BUN: 16 mg/dL (ref 6–20)
CHLORIDE: 107 mmol/L (ref 101–111)
CO2: 24 mmol/L (ref 22–32)
CREATININE: 0.76 mg/dL (ref 0.44–1.00)
Calcium: 9.5 mg/dL (ref 8.9–10.3)
GFR calc Af Amer: >60 mL/min (ref >60)
GFR calc non Af Amer: >60 mL/min (ref >60)
GLUCOSE: 110 mg/dL — AB (ref 65–99)
POTASSIUM: 4.1 mmol/L (ref 3.5–5.1)
Sodium: 139 mmol/L (ref 135–145)
Total Bilirubin: 0.7 mg/dL (ref 0.3–1.2)
Total Protein: 6.5 g/dL (ref 6.5–8.1)

## 2018-04-06 MED ORDER — ACETAMINOPHEN 325 MG PO TABS
650.0000 mg | ORAL_TABLET | Freq: Once | ORAL | Status: AC
  Administered 2018-04-06: 650 mg via ORAL
  Filled 2018-04-06: qty 2

## 2018-04-06 MED ORDER — DARATUMUMAB CHEMO INJECTION 400 MG/20ML
900.0000 mg | Freq: Once | INTRAVENOUS | Status: AC
Start: 2018-04-06 — End: 2018-04-06
  Administered 2018-04-06: 900 mg via INTRAVENOUS
  Filled 2018-04-06: qty 40

## 2018-04-06 MED ORDER — PROCHLORPERAZINE MALEATE 10 MG PO TABS
10.0000 mg | ORAL_TABLET | Freq: Once | ORAL | Status: AC
  Administered 2018-04-06: 10 mg via ORAL

## 2018-04-06 MED ORDER — SODIUM CHLORIDE 0.9 % IV SOLN
Freq: Once | INTRAVENOUS | Status: AC
  Administered 2018-04-06: 09:00:00 via INTRAVENOUS
  Filled 2018-04-06: qty 1000

## 2018-04-06 MED ORDER — SODIUM CHLORIDE 0.9 % IV SOLN
20.0000 mg | Freq: Once | INTRAVENOUS | Status: AC
  Administered 2018-04-06: 20 mg via INTRAVENOUS
  Filled 2018-04-06: qty 2

## 2018-04-06 MED ORDER — APIXABAN 5 MG PO TABS: 5.0000 mg | ORAL_TABLET | Freq: Two times a day (BID) | ORAL | 3 refills | Status: AC

## 2018-04-06 MED ORDER — DIPHENHYDRAMINE HCL 25 MG PO CAPS
50.0000 mg | ORAL_CAPSULE | Freq: Once | ORAL | Status: AC
Start: 2018-04-06 — End: 2018-04-06
  Administered 2018-04-06: 50 mg via ORAL
  Filled 2018-04-06: qty 2

## 2018-04-06 NOTE — Progress Notes (Signed)
Hematology/Oncology Follow up note [facility] [phone] [phone]   Patient Care Team: McLean-Scocuzza, Nino Glow, MD as PCP - General (Internal Medicine)  REFERRING PROVIDER: Dr. Baruch Gouty, Donette  REASON FOR VISIT Follow up for treatment of multiple myeloma.   HISTORY OF PRESENTING ILLNESS:  Cassie Jones is a  72 y.o.  female with PMH listed below who was referred to me for evaluation of multiple myeloma.  Patient used to follow-up with local oncologist Dr. Tonia Brooms and [facility] oncologist Dr. Amalia Hailey.  Patient tells me that as her husband works every day and has a busy working schedule, she is not able to get transportation to her treatment.  As an alternative she is now living with her brother who is close to [facility] [place] and want to transfer care here. Extensive medical record review was performed.    Patient was diagnosed with IgG lambda, ISS2, t (4,14) multiple myeloma on February 03, 2017, she has hemoglobin of 9, calcium 8.9, LDH 132, beta microglobulin 4.6, albumin 3.2.  Skeletal survey negative.  PET scan with single nonspecific humeral lesion and a thyroid nodule.  Marrow with 60% plasmacytosis by CD138 IHC, FISH with hyperdiploidy t(4, 14), 1 q. gain.. Per note, she was treated with CyborD (M spike 3.6 at start)  from 01/2017 to 02/2017. Her treatment was switched to Revlimid Velcade dexamethasone from 02/2017, M spike 1.2 at start,and was found to have progression of disease in 09/2017, when her M spike increased to 1.8, and developed compression fracture in 10/2016. She was restarted on RVD using weekly Velcade.  She was recently seen by Dr. Boris Sharper and recommended to start on a combination of daratumumab, Pomalyst, dexamethasone. Per patient she received her first daratumumab last week with split dose 40m/kg given on 3/14 and 3/15.  Patient reports that she tolerate the treatment well except mild infusion  reaction.  # Patient also reports that she has got dental clearance and has been given 1 dose of Zometa by Dr. VRemus Loffler(unkown dates).  [facility]  # Patient was seen by nurse practitioner last week prior to her cycle 2 daratumumab.  She reports feeling shortness of breath, having cough, believed to have bronchitis and was prescribed Levaquin 500 mg p.o. daily for 7 days, and albuterol as needed.  She also had a VQ scan low probability for pulmonary embolism.  # 01/29/2018 5th weekly treatment of Daratumumab,  Cycle 2 Pomalyst was started for a week and was nterrupted due to acute bronchitis treated with outpatient Azithromycin.    # 02/16/2018 developed right upper extremity swelling and tenderness, confirmed to be DVT. She was taking Aspirin.  Started on Eliquis. Aspirin is stopped.  # 02/16/2018 6th Daratumumab treatment. Was started on Cycle 3 Pomalyst, she has completed about 2 weeks treatment. She was admitted to hospital on 02/21/2018 for Klebsiella UTI and oncology was not consulted. She was treatment with antibiotics and continued on Pomalyst. She was hospitalized again on 03/02/2018 for pneumonia, COPD exacerbation/emphasema and oncology was consulted and patient was seen by my colleague Dr.Finnergan and advise patient to stop Pomalyst.   # [facility] recommends dose reduced Pomalyst 391m This was previously discussed with patient, and she was reluctant to be restarted on Pomalyst. Today she seems to be more acceptable for restarting Pomalyst, but requests to start in a few weeks so that she can  have a nice break.   #Previously declined bone marrow chest evaluation. INTERVAL HISTORY Cassie Jones is a 72 y.o. female who has above history reviewed by me today presents for follow up visit for management of multiple myeloma. #Back pain, chronic, stable.Marland Kitchen #Fatigue: Much better. # chronic  cough: Resolved. #Shortness of breath: Chronic, stable.  Worsened by exertion. Denies any fever or chills.  Data reviewed:  Multiple myeloma labs obtained at last visit:  SPEP showed VGPR with >90% reduction of her M protein. Normalized free light chain ratio.   Current Treatment  # 3/14, 3/15 at outside facility, she got split dose for first treatment of daratumumab. #  01/08/2018 Start on weekly daratumumab '16mg'$ /m2, dexamethasone '20mg'$ ,  S/p # Palliative RT to spine. 01/08/2018 Cycle 1 Pomalyst '4mg'$  Day 1-21 ogf 28 day cycle.   Current Pain medication: Oxycodone '5mg'$  Q8h as needed. Oxycontin '10mg'$  Q12h.   Review of Systems  Constitutional: Positive for malaise/fatigue. Negative for chills, fever and weight loss.  HENT: Negative for congestion, ear discharge, ear pain, hearing loss, nosebleeds, sinus pain and sore throat.   Eyes: Negative for blurred vision, double vision, photophobia, pain, discharge and redness.  Respiratory: Negative for cough, hemoptysis, sputum production, shortness of breath and wheezing.   Cardiovascular: Negative for chest pain, palpitations, orthopnea, claudication and leg swelling.  Gastrointestinal: Negative for abdominal pain, blood in stool, constipation, diarrhea, heartburn, melena, nausea and vomiting.  Genitourinary: Negative for dysuria, flank pain, frequency, hematuria and urgency.  Musculoskeletal: Positive for back pain. Negative for joint pain, myalgias and neck pain.  Skin: Negative for itching and rash.  Neurological: Negative for dizziness, tingling, tremors, sensory change, speech change, focal weakness, weakness and headaches.  Endo/Heme/Allergies: Negative for environmental allergies. Does not bruise/bleed easily.  Psychiatric/Behavioral: Negative for depression, hallucinations, substance abuse and suicidal ideas. The patient is not nervous/anxious.     MEDICAL HISTORY:  Past Medical History:  Diagnosis Date  . Ascites   . Asthma   .  Bilateral leg edema   . Cancer ([place])   . Chicken pox   . Colon polyps   . Diverticulitis    with perforation 02/2017 and hosp with colostomy bag s/p removal   . Drug-induced neutropenia ([place]) 03/09/2018  . GERD (gastroesophageal reflux disease)   . Hyperlipidemia   . Hypertension   . Multiple myeloma ([place])    dx'ed 01/2018 follows Dr. Tasia Catchings and [facility] H/O   . Pleural effusion   . Pneumonia    03/02/18   . Thyroid nodule   . UTI (urinary tract infection)   . Vitamin D deficiency     SURGICAL HISTORY: Past Surgical History:  Procedure Laterality Date  . ABDOMINAL HYSTERECTOMY    . BREAST BIOPSY Bilateral yrs ago   benign  . CHOLECYSTECTOMY    . COLOSTOMY  2018  . COLOSTOMY REVERSAL     11 2018  . KYPHOPLASTY     11/2017 [place]     SOCIAL HISTORY: Social History   Socioeconomic History  . Marital status: Married    Spouse name: Not on file  . Number of children: Not on file  . Years of education: Not on file  . Highest education level: Not on file  Occupational History  . Not on file  Social Needs  . Financial resource strain: Not hard at all  . Food insecurity:    Worry: Never true    Inability: Never true  . Transportation needs:    Medical: No  Non-medical: No  Tobacco Use  . Smoking status: Former Research scientist (life sciences)  . Smokeless tobacco: Never Used  Substance and Sexual Activity  . Alcohol use: Not Currently  . Drug use: Never  . Sexual activity: Yes    Comment: husband   Lifestyle  . Physical activity:    Days per week: 0 days    Minutes per session: 0 min  . Stress: Not at all  Relationships  . Social connections:    Talks on phone: More than three times a week    Gets together: Once a week    Attends religious service: 1 to 4 times per year    Active member of club or organization: No    Attends meetings of clubs or organizations: Never    Relationship status: Married  . Intimate partner violence:    Fear of current or ex partner: No    Emotionally  abused: No    Physically abused: No    Forced sexual activity: No  Other Topics Concern  . Not on file  Social History Narrative   Married    Husband lives in [place] while she gets treatment in this area    She lives alone in this area with family close by     FAMILY HISTORY: Family History  Problem Relation Age of Onset  . Breast cancer Mother 35  . Cancer Mother        breast   . Diabetes Mother   . Heart disease Mother   . Hyperlipidemia Mother   . Hypertension Mother   . Birth defects Brother   . Diabetes Brother   . Heart disease Brother   . Hyperlipidemia Brother   . Cancer Father        prostate met to liver     ALLERGIES:  is allergic to codeine.  MEDICATIONS:  Current Outpatient Medications  Medication Sig Dispense Refill  . acetaminophen (TYLENOL) 500 MG tablet Take 1,000 mg by mouth every 6 (six) hours as needed for mild pain or fever.    Marland Kitchen albuterol (VENTOLIN HFA) 108 (90 Base) MCG/ACT inhaler Inhale 2 puffs into the lungs every 6 (six) hours as needed. 1 Inhaler 1  . apixaban (ELIQUIS) 5 MG TABS tablet Take 1 tablet (5 mg total) by mouth 2 (two) times daily. 60 tablet 3  . Cholecalciferol 50000 units capsule Take 1 capsule (50,000 Units total) by mouth once a week. 13 capsule 1  . feeding supplement, ENSURE ENLIVE, (ENSURE ENLIVE) LIQD Take 237 mLs by mouth 3 (three) times daily between meals. 237 mL 12  . fluticasone (FLONASE) 50 MCG/ACT nasal spray Place 1-2 sprays into both nostrils daily. 16 g 2  . fluticasone (FLOVENT DISKUS) 50 MCG/BLIST diskus inhaler Inhale 1 puff into the lungs 2 (two) times daily. 1 Inhaler 3  . furosemide (LASIX) 20 MG tablet Take 1 tablet (20 mg total) by mouth daily as needed. 30 tablet 2  . loperamide (IMODIUM A-D) 2 MG tablet Take 2 mg by mouth 4 (four) times daily as needed for diarrhea or loose stools.    . ondansetron (ZOFRAN) 8 MG tablet Take 8 mg by mouth every 8 (eight) hours as needed for nausea.    . pantoprazole  (PROTONIX) 40 MG tablet Take 1 tablet by mouth every morning.    . pomalidomide (POMALYST) 3 MG capsule Take 1 capsule (3 mg total) by mouth daily. Take with water on days 1-21. Repeat every 28 days. 21 capsule 3  .  pravastatin (PRAVACHOL) 10 MG tablet Take 1 tablet by mouth at bedtime.    . prochlorperazine (COMPAZINE) 10 MG tablet Take 10 mg by mouth every 6 (six) hours as needed for nausea.    . montelukast (SINGULAIR) 10 MG tablet Take 10 mg by mouth at bedtime.    Marland Kitchen oxyCODONE (OXY IR/ROXICODONE) 5 MG immediate release tablet Take 5 mg by mouth 3 (three) times daily as needed for moderate pain.    Marland Kitchen oxyCODONE (OXYCONTIN) 10 mg 12 hr tablet Take 10 mg by mouth every 12 (twelve) hours as needed (severe pain).    . potassium chloride SA (K-DUR,KLOR-CON) 20 MEQ tablet Take 1 tablet (20 mEq total) by mouth daily. (Patient not taking: Reported on 04/06/2018) 14 tablet 0   No current facility-administered medications for this visit.      PHYSICAL EXAMINATION: ECOG PERFORMANCE STATUS: 2 - Symptomatic, <50% confined to bed Vitals:   04/06/18 0842  BP: 131/74  Pulse: 65  Resp: 18  Temp: (!) 97.4 F (36.3 C)   Filed Weights   04/06/18 0842  Weight: 132 lb 9.6 oz (60.1 kg)    Physical Exam  Constitutional: She is oriented to person, place, and time and well-developed, well-nourished, and in no distress. No distress.  HENT:  Head: Normocephalic and atraumatic.  Nose: Nose normal.  Mouth/Throat: Oropharynx is clear and moist. No oropharyngeal exudate.  Eyes: Pupils are equal, round, and reactive to light. EOM are normal. Left eye exhibits no discharge. No scleral icterus.  Neck: Normal range of motion. Neck supple. No JVD present. No tracheal deviation present.  Cardiovascular: Normal rate, regular rhythm and normal heart sounds.  No murmur heard. Pulmonary/Chest: Effort normal and breath sounds normal. No respiratory distress. She has no wheezes. She has no rales. She exhibits no  tenderness.  Decreased breath sounds bilaterally.   Abdominal: Soft. She exhibits no distension and no mass. There is no tenderness. There is no rebound and no guarding.  Musculoskeletal: Normal range of motion. She exhibits no edema or tenderness.  Lymphadenopathy:    She has no cervical adenopathy.  Neurological: She is alert and oriented to person, place, and time. No cranial nerve deficit. She exhibits normal muscle tone. Coordination normal.  Skin: Skin is warm and dry. No rash noted. She is not diaphoretic. No erythema. No pallor.  Psychiatric: Memory, affect and judgment normal.     LABORATORY DATA:  I have reviewed the data as listed Lab Results  Component Value Date   WBC 5.3 04/06/2018   HGB 12.4 04/06/2018   HCT 36.3 04/06/2018   MCV 88.7 04/06/2018   PLT 164 04/06/2018   Recent Labs    03/16/18 1115 03/23/18 0841 04/06/18 0823  NA 141 141 139  K 3.0* 3.8 4.1  CL 104 106 107  CO2 '25 24 24  '$ GLUCOSE 139* 137* 110*  BUN '11 14 16  '$ CREATININE 0.76 0.70 0.76  CALCIUM 9.3 9.6 9.5  GFRNONAA >60 >60 >60  GFRAA >60 >60 >60  PROT 6.2* 6.2* 6.5  ALBUMIN 2.9* 3.2* 3.6  AST '21 22 17  '$ ALT '14 16 17  '$ ALKPHOS 72 75 74  BILITOT 0.4 0.8 0.7       ASSESSMENT & PLAN:  1. Bone metastasis ([place])   2. Multiple myeloma in relapse ([place])   3. Encounter for antineoplastic chemotherapy   4. Encounter for antineoplastic immunotherapy   5. Acute deep vein thrombosis (DVT) of ulnar vein of right upper extremity ([place])    #  Multiple myeloma :  Reviewed labs, she has had very good response to current treatment.  Tolerate daratumumab well. Counts are acceptable.  Proceed with daratumumab today.  She is now on every two-week schedule. Also start Pomalyst to reduce dose of 3 mg day 1- 21 every 28-day cycle, starting today. I recommend patient to obtain labs weekly.  Patient preferred to have labs checked in 2 weeks.  But she knows to call if she does not feel well.  Repeat CBC CMP in 2  weeks for assessment of daratumumab treatment and tolerability of Pomalyst.  If she continues to be neutropenic despite dose reduce, will give G-CSF support.    # Right upper extremity DVT, provoked due to being on Pomalyst.  Continue Eliquis 5 mg twice daily, refill sent to pharmacy.    # Bone Metastasis: Discussed with patient about obtaining dental clearance.  She says she will call her dentist and faxed to [place].    # Drug induced neutropenia: Continue to monitor. Orders Placed This Encounter  Procedures  . CBC with Differential/Platelet    Standing Status:   Future    Standing Expiration Date:   04/07/2019  . Comprehensive metabolic panel    Standing Status:   Future    Standing Expiration Date:   04/07/2019   All questions were answered. The patient knows to call the clinic with any problems questions or concerns. Return of visit: 2 weeks for assessment prior to  daratumumab   Total face to face encounter time for this patient visit was 25 min. >50% of the time was  spent in counseling and coordination of care.    Earlie Server, MD, PhD Hematology Oncology [facility] Pager- [phone] 04/06/2018

## 2018-04-06 NOTE — Progress Notes (Signed)
Patient here today for follow up. Pt would like to know if she should continue eliquis, if so she would need a refill.

## 2018-04-06 NOTE — Telephone Encounter (Signed)
Yes continue eliquis 

## 2018-04-06 NOTE — Telephone Encounter (Signed)
Patient states she forgot to ask if she is to continue Eliquis or not. Please advise

## 2018-04-06 NOTE — Telephone Encounter (Signed)
I sent over refills to her pharmacy

## 2018-04-06 NOTE — Telephone Encounter (Signed)
Patient informed to continue Eliquis

## 2018-04-12 ENCOUNTER — Telehealth: Payer: Self-pay | Admitting: *Deleted

## 2018-04-12 NOTE — Telephone Encounter (Signed)
Patient called and states She needs a note for dental cleaning clearance

## 2018-04-13 NOTE — Telephone Encounter (Signed)
Spoke with patient, we will discuss with Dr Tasia Catchings at appt on Tuesday.

## 2018-04-16 ENCOUNTER — Telehealth: Payer: Self-pay | Admitting: *Deleted

## 2018-04-16 ENCOUNTER — Other Ambulatory Visit: Payer: Self-pay | Admitting: *Deleted

## 2018-04-16 NOTE — Telephone Encounter (Signed)
Patient called to ask whether or not she should stop her Pomalyst while she is taking Cipro for a UTI. She was out of town and went to Urgent Care for UTI and was prescribed Cipro on 6/26, to take for 7 days. She thought she remembered that when she was in the hospital, Dr. Tasia Catchings had told her not to take the Pomalyst while on an antibiotic, but she wasn't sure. Please advise.       dhs

## 2018-04-16 NOTE — Telephone Encounter (Signed)
Spoke to patient via telephone and instructed her to hold her pomalyst while on the Cipro per Honor Loh, NP. She can resume once she is finished. Patient verbalized understanding.    dhs

## 2018-04-19 ENCOUNTER — Other Ambulatory Visit: Payer: Self-pay | Admitting: Oncology

## 2018-04-19 MED ORDER — POMALIDOMIDE 3 MG PO CAPS: 3.0000 mg | ORAL_CAPSULE | Freq: Every day | ORAL | 0 refills | Status: DC

## 2018-04-20 ENCOUNTER — Other Ambulatory Visit: Payer: Self-pay

## 2018-04-20 ENCOUNTER — Encounter: Payer: Self-pay | Admitting: Oncology

## 2018-04-20 ENCOUNTER — Inpatient Hospital Stay: Payer: Medicare PPO

## 2018-04-20 ENCOUNTER — Other Ambulatory Visit: Payer: Self-pay | Admitting: Oncology

## 2018-04-20 ENCOUNTER — Inpatient Hospital Stay: Payer: Medicare PPO | Attending: Oncology

## 2018-04-20 ENCOUNTER — Inpatient Hospital Stay (HOSPITAL_BASED_OUTPATIENT_CLINIC_OR_DEPARTMENT_OTHER): Payer: Medicare PPO | Admitting: Oncology

## 2018-04-20 VITALS — BP 142/78 | HR 60 | Temp 97.5°F | Resp 18 | Wt 135.7 lb

## 2018-04-20 DIAGNOSIS — C9002 Multiple myeloma in relapse: Secondary | ICD-10-CM

## 2018-04-20 DIAGNOSIS — Z86718 Personal history of other venous thrombosis and embolism: Secondary | ICD-10-CM | POA: Diagnosis not present

## 2018-04-20 DIAGNOSIS — C7951 Secondary malignant neoplasm of bone: Secondary | ICD-10-CM | POA: Diagnosis not present

## 2018-04-20 DIAGNOSIS — Z87440 Personal history of urinary (tract) infections: Secondary | ICD-10-CM | POA: Diagnosis not present

## 2018-04-20 DIAGNOSIS — Z87891 Personal history of nicotine dependence: Secondary | ICD-10-CM | POA: Diagnosis not present

## 2018-04-20 DIAGNOSIS — M549 Dorsalgia, unspecified: Secondary | ICD-10-CM | POA: Insufficient documentation

## 2018-04-20 DIAGNOSIS — Z5112 Encounter for antineoplastic immunotherapy: Secondary | ICD-10-CM | POA: Insufficient documentation

## 2018-04-20 DIAGNOSIS — R5383 Other fatigue: Secondary | ICD-10-CM | POA: Insufficient documentation

## 2018-04-20 DIAGNOSIS — M7989 Other specified soft tissue disorders: Secondary | ICD-10-CM | POA: Insufficient documentation

## 2018-04-20 DIAGNOSIS — R600 Localized edema: Secondary | ICD-10-CM

## 2018-04-20 DIAGNOSIS — I10 Essential (primary) hypertension: Secondary | ICD-10-CM | POA: Insufficient documentation

## 2018-04-20 DIAGNOSIS — E785 Hyperlipidemia, unspecified: Secondary | ICD-10-CM | POA: Insufficient documentation

## 2018-04-20 DIAGNOSIS — D702 Other drug-induced agranulocytosis: Secondary | ICD-10-CM

## 2018-04-20 DIAGNOSIS — Z79899 Other long term (current) drug therapy: Secondary | ICD-10-CM | POA: Insufficient documentation

## 2018-04-20 DIAGNOSIS — T451X5S Adverse effect of antineoplastic and immunosuppressive drugs, sequela: Secondary | ICD-10-CM

## 2018-04-20 DIAGNOSIS — J449 Chronic obstructive pulmonary disease, unspecified: Secondary | ICD-10-CM | POA: Diagnosis not present

## 2018-04-20 DIAGNOSIS — Z8042 Family history of malignant neoplasm of prostate: Secondary | ICD-10-CM | POA: Insufficient documentation

## 2018-04-20 DIAGNOSIS — R5381 Other malaise: Secondary | ICD-10-CM | POA: Diagnosis not present

## 2018-04-20 DIAGNOSIS — Z803 Family history of malignant neoplasm of breast: Secondary | ICD-10-CM | POA: Insufficient documentation

## 2018-04-20 DIAGNOSIS — I82721 Chronic embolism and thrombosis of deep veins of right upper extremity: Secondary | ICD-10-CM | POA: Insufficient documentation

## 2018-04-20 DIAGNOSIS — G620 Drug-induced polyneuropathy: Secondary | ICD-10-CM | POA: Diagnosis not present

## 2018-04-20 DIAGNOSIS — K219 Gastro-esophageal reflux disease without esophagitis: Secondary | ICD-10-CM

## 2018-04-20 DIAGNOSIS — Z7901 Long term (current) use of anticoagulants: Secondary | ICD-10-CM | POA: Diagnosis not present

## 2018-04-20 DIAGNOSIS — Z5111 Encounter for antineoplastic chemotherapy: Secondary | ICD-10-CM

## 2018-04-20 LAB — CBC WITH DIFFERENTIAL/PLATELET
BASOS ABS: 0 10*3/uL (ref 0–0.1)
Basophils Relative: 1 %
Eosinophils Absolute: 0.1 10*3/uL (ref 0–0.7)
Eosinophils Relative: 4 %
HEMATOCRIT: 35.8 % (ref 35.0–47.0)
Hemoglobin: 12.3 g/dL (ref 12.0–16.0)
LYMPHS ABS: 0.7 10*3/uL — AB (ref 1.0–3.6)
LYMPHS PCT: 19 %
MCH: 30.1 pg (ref 26.0–34.0)
MCHC: 34.5 g/dL (ref 32.0–36.0)
MCV: 87.4 fL (ref 80.0–100.0)
MONO ABS: 0.9 10*3/uL (ref 0.2–0.9)
Monocytes Relative: 23 %
NEUTROS ABS: 2 10*3/uL (ref 1.4–6.5)
Neutrophils Relative %: 53 %
Platelets: 124 10*3/uL — ABNORMAL LOW (ref 150–440)
RBC: 4.1 MIL/uL (ref 3.80–5.20)
RDW: 15.1 % — AB (ref 11.5–14.5)
WBC: 3.7 10*3/uL (ref 3.6–11.0)

## 2018-04-20 LAB — COMPREHENSIVE METABOLIC PANEL
ALT: 17 U/L (ref 0–44)
ANION GAP: 8 (ref 5–15)
AST: 16 U/L (ref 15–41)
Albumin: 3.3 g/dL — ABNORMAL LOW (ref 3.5–5.0)
Alkaline Phosphatase: 60 U/L (ref 38–126)
BUN: 11 mg/dL (ref 8–23)
CHLORIDE: 107 mmol/L (ref 98–111)
CO2: 28 mmol/L (ref 22–32)
Calcium: 9.1 mg/dL (ref 8.9–10.3)
Creatinine, Ser: 0.7 mg/dL (ref 0.44–1.00)
GFR calc Af Amer: >60 mL/min (ref >60)
GFR calc non Af Amer: >60 mL/min (ref >60)
GLUCOSE: 108 mg/dL — AB (ref 70–99)
POTASSIUM: 3.3 mmol/L — AB (ref 3.5–5.1)
SODIUM: 143 mmol/L (ref 135–145)
TOTAL PROTEIN: 6.3 g/dL — AB (ref 6.5–8.1)
Total Bilirubin: 1 mg/dL (ref 0.3–1.2)

## 2018-04-20 MED ORDER — SODIUM CHLORIDE 0.9 % IV SOLN
20.0000 mg | Freq: Once | INTRAVENOUS | Status: AC
  Administered 2018-04-20: 20 mg via INTRAVENOUS
  Filled 2018-04-20: qty 2

## 2018-04-20 MED ORDER — SODIUM CHLORIDE 0.9 % IV SOLN
900.0000 mg | Freq: Once | INTRAVENOUS | Status: AC
  Administered 2018-04-20: 900 mg via INTRAVENOUS
  Filled 2018-04-20: qty 40

## 2018-04-20 MED ORDER — ACETAMINOPHEN 325 MG PO TABS
650.0000 mg | ORAL_TABLET | Freq: Once | ORAL | Status: AC
  Administered 2018-04-20: 650 mg via ORAL
  Filled 2018-04-20: qty 2

## 2018-04-20 MED ORDER — MONTELUKAST SODIUM 10 MG PO TABS
10.0000 mg | ORAL_TABLET | Freq: Every day | ORAL | Status: DC
  Administered 2018-04-20: 10 mg via ORAL
  Filled 2018-04-20: qty 1

## 2018-04-20 MED ORDER — PROCHLORPERAZINE MALEATE 10 MG PO TABS
10.0000 mg | ORAL_TABLET | Freq: Once | ORAL | Status: AC
  Administered 2018-04-20: 10 mg via ORAL
  Filled 2018-04-20: qty 1

## 2018-04-20 MED ORDER — SODIUM CHLORIDE 0.9 % IV SOLN
Freq: Once | INTRAVENOUS | Status: AC
  Administered 2018-04-20: 10:00:00 via INTRAVENOUS
  Filled 2018-04-20: qty 1000

## 2018-04-20 MED ORDER — DIPHENHYDRAMINE HCL 25 MG PO CAPS
50.0000 mg | ORAL_CAPSULE | Freq: Once | ORAL | Status: AC
  Administered 2018-04-20: 50 mg via ORAL
  Filled 2018-04-20: qty 2

## 2018-04-20 MED ORDER — POTASSIUM CHLORIDE ER 10 MEQ PO TBCR: 10.0000 meq | EXTENDED_RELEASE_TABLET | Freq: Every day | ORAL | 0 refills | Status: DC

## 2018-04-20 NOTE — Progress Notes (Signed)
Patient here for follow up. Has edema to feet and she is concerned because it gets worse by nighttime. Has Prn lasix but doesn't know if it will interfere with any of her medication. Pomalyst currently on hold due to pt being on Cipro for UTI.

## 2018-04-20 NOTE — Progress Notes (Signed)
Hematology/Oncology Follow up note [facility] [phone] Fax:(336) [phone]   Patient Care Team: McLean-Scocuzza, Cassie Glow, MD as PCP - General (Internal Medicine)  REFERRING PROVIDER: Dr. Baruch Jones, Cassie Jones  REASON FOR VISIT Follow up for treatment of multiple myeloma.   HISTORY OF PRESENTING ILLNESS:  Cassie Jones is a  73 y.o.  female with PMH listed below who was referred to me for evaluation of multiple myeloma.  Patient used to follow-up with local oncologist Dr. Tonia Jones and [facility] oncologist Dr. Amalia Jones.  Patient tells me that as her husband works every day and has a busy working schedule, she is not able to get transportation to her treatment.  As an alternative she is now living with her brother who is close to [facility] [place] and want to transfer care here. Extensive medical record review was performed.    Patient was diagnosed with IgG lambda, ISS2, t (4,14) multiple myeloma on February 03, 2017, she has hemoglobin of 9, calcium 8.9, LDH 132, beta microglobulin 4.6, albumin 3.2.  Skeletal survey negative.  PET scan with single nonspecific humeral lesion and a thyroid nodule.  Marrow with 60% plasmacytosis by CD138 IHC, FISH with hyperdiploidy t(4, 14), 1 q. gain.. Per note, she was treated with CyborD (M spike 3.6 at start)  from 01/2017 to 02/2017. Her treatment was switched to Revlimid Velcade dexamethasone from 02/2017, M spike 1.2 at start,and was found to have progression of disease in 09/2017, when her M spike increased to 1.8, and developed compression fracture in 10/2016. She was restarted on RVD using weekly Velcade.  She was recently seen by Dr. Boris Jones and recommended to start on a combination of daratumumab, Pomalyst, dexamethasone. Per patient she received her first daratumumab last week with split dose 11m/kg given on 3/14 and 3/15.  Patient reports that she tolerate the treatment well except mild infusion  reaction.  # Patient also reports that she has got dental clearance and has been given 1 dose of Zometa by Dr. VRemus Jones(unkown dates).  [facility]  # Patient was seen by nurse practitioner last week prior to her cycle 2 daratumumab.  She reports feeling shortness of breath, having cough, believed to have bronchitis and was prescribed Levaquin 500 mg p.o. daily for 7 days, and albuterol as needed.  She also had a VQ scan low probability for pulmonary embolism.  # 01/29/2018 5th weekly treatment of Daratumumab,  Cycle 2 Pomalyst was started for a week and was nterrupted due to acute bronchitis treated with outpatient Azithromycin.    # 02/16/2018 developed right upper extremity swelling and tenderness, confirmed to be DVT. She was taking Aspirin.  Started on Eliquis. Aspirin is stopped.  # 02/16/2018 6th Daratumumab treatment. Was started on Cycle 3 Pomalyst, she has completed about 2 weeks treatment. She was admitted to hospital on 02/21/2018 for Klebsiella UTI and oncology was not consulted. She was treatment with antibiotics and continued on Pomalyst. She was hospitalized again on 03/02/2018 for pneumonia, COPD exacerbation/emphasema and oncology was consulted and patient was seen by my colleague Cassie Jones and advise patient to stop Pomalyst.   # [facility]  [facility] who recommends dose reduced Pomalyst 367m This was previously discussed with patient, and she was reluctant to be restarted on Pomalyst. Today she seems to be more acceptable for restarting Pomalyst, but requests to start in a few weeks so that she can  have a nice break.   #Previously declined bone marrow transplant evaluation.  INTERVAL HISTORY Cassie Jones is a 72 y.o. female who has above history reviewed by me today presents for follow up Visit for management of multiple myeloma.  #Multiple myeloma, she was taking Pomalyst 3 mg daily,  Pomalyst was held since 4 days ago after patient developed urinary tract infection symptoms.  Denies any fever or chills.  Have dysuria.  #UTI: Patient reports dysuria and increased frequency.  She went to local urgent care and had a urine checked.  Per patient, she was told that she has UTI.  She was prescribed with Cipro and symptoms resolved. Not associated with fever or chills.  She did not get CBC checked at that time. She was not told if any urine culture was done.  #Back pain, stable. #Fatigue: Better. #Shortness of breath: Chronic and is stable. #Bilateral lower extremity swelling, this is a new problem to her for the past 4 to 5 days.  Swelling is better if she elevate her legs.  Get worse if she being on her feet at the end of day.  No other aggravating or elevating factors.   She recalls that last year she had a similar problem and she was taking spironolactone 25 mg daily.  Currently she is not on spironolactone.  Data reviewed:  Multiple myeloma labs obtained at last visit:  SPEP showed VGPR with >90% reduction of her M protein. Normalized free light chain ratio.   Current Treatment  # 3/14, 3/15 at outside facility, she got split dose for first treatment of daratumumab. #  01/08/2018 Start on weekly daratumumab 43m/m2, dexamethasone 240m  S/p # Palliative RT to spine. 01/08/2018 started cycle 1 Pomalyst 71m77may 1-21 ogf 28 day cycle.  Pomalyst 4 mg was held after 3 cycles with multiple interruptions due to neutropenia and recurrent pneumonia.  Pomalyst was restarted at a lower dose of 3 mg on 04/06/2018.  Current Pain medication: Oxycodone 5mg101mh as needed. Oxycontin 10mg71mh.   Review of Systems  Constitutional: Positive for malaise/fatigue. Negative for chills, fever and weight loss.  HENT: Negative for congestion, ear discharge, ear pain, hearing loss, nosebleeds, sinus pain and sore throat.   Eyes: Negative for blurred vision, double vision, photophobia, pain,  discharge and redness.  Respiratory: Negative for cough, hemoptysis, sputum production, shortness of breath and wheezing.   Cardiovascular: Negative for chest pain, palpitations, orthopnea, claudication and leg swelling.  Gastrointestinal: Negative for abdominal pain, blood in stool, constipation, diarrhea, heartburn, melena, nausea and vomiting.  Genitourinary: Negative for dysuria, flank pain, frequency, hematuria and urgency.  Musculoskeletal: Positive for back pain. Negative for joint pain, myalgias and neck pain.  Skin: Negative for itching and rash.  Neurological: Negative for dizziness, tingling, tremors, sensory change, speech change, focal weakness, weakness and headaches.  Endo/Heme/Allergies: Negative for environmental allergies. Does not bruise/bleed easily.  Psychiatric/Behavioral: Negative for depression, hallucinations, substance abuse and suicidal ideas. The patient is not nervous/anxious.     MEDICAL HISTORY:  Past Medical History:  Diagnosis Date  . Ascites   . Asthma   . Bilateral leg edema   . Cancer (HCC) [place] Chicken pox   . Colon polyps   . Diverticulitis    with perforation 02/2017 and hosp with colostomy bag s/p removal   . Drug-induced neutropenia (HCC) [place]1/2019  . GERD (gastroesophageal reflux disease)   . Hyperlipidemia   . Hypertension   . Multiple myeloma (HCC)[place]  dx'ed 01/2018 follows Dr. Tasia Catchings and [facility]   . Pleural effusion   . Pneumonia    03/02/18   . Thyroid nodule   . UTI (urinary tract infection)   . Vitamin D deficiency     SURGICAL HISTORY: Past Surgical History:  Procedure Laterality Date  . ABDOMINAL HYSTERECTOMY    . BREAST BIOPSY Bilateral yrs ago   benign  . CHOLECYSTECTOMY    . COLOSTOMY  2018  . COLOSTOMY REVERSAL     11 2018  . KYPHOPLASTY     11/2017 [place]     SOCIAL HISTORY: Social History   Socioeconomic History  . Marital status: Married    Spouse name: Not on file  . Number of children: Not on file  .  Years of education: Not on file  . Highest education level: Not on file  Occupational History  . Not on file  Social Needs  . Financial resource strain: Not hard at all  . Food insecurity:    Worry: Never true    Inability: Never true  . Transportation needs:    Medical: No    Non-medical: No  Tobacco Use  . Smoking status: Former Research scientist (life sciences)  . Smokeless tobacco: Never Used  Substance and Sexual Activity  . Alcohol use: Not Currently  . Drug use: Never  . Sexual activity: Yes    Comment: husband   Lifestyle  . Physical activity:    Days per week: 0 days    Minutes per session: 0 min  . Stress: Not at all  Relationships  . Social connections:    Talks on phone: More than three times a week    Gets together: Once a week    Attends religious service: 1 to 4 times per year    Active member of club or organization: No    Attends meetings of clubs or organizations: Never    Relationship status: Married  . Intimate partner violence:    Fear of current or ex partner: No    Emotionally abused: No    Physically abused: No    Forced sexual activity: No  Other Topics Concern  . Not on file  Social History Narrative   Married    Husband lives in [place] while she gets treatment in this area    She lives alone in this area with family close by     FAMILY HISTORY: Family History  Problem Relation Age of Onset  . Breast cancer Mother 39  . Cancer Mother        breast   . Diabetes Mother   . Heart disease Mother   . Hyperlipidemia Mother   . Hypertension Mother   . Birth defects Brother   . Diabetes Brother   . Heart disease Brother   . Hyperlipidemia Brother   . Cancer Father        prostate met to liver     ALLERGIES:  is allergic to codeine.  MEDICATIONS:  Current Outpatient Medications  Medication Sig Dispense Refill  . acetaminophen (TYLENOL) 500 MG tablet Take 1,000 mg by mouth every 6 (six) hours as needed for mild pain or fever.    Marland Kitchen albuterol (VENTOLIN  HFA) 108 (90 Base) MCG/ACT inhaler Inhale 2 puffs into the lungs every 6 (six) hours as needed. 1 Inhaler 1  . apixaban (ELIQUIS) 5 MG TABS tablet Take 1 tablet (5 mg total) by mouth 2 (two) times daily. 60 tablet 3  . Cholecalciferol 50000 units  capsule Take 1 capsule (50,000 Units total) by mouth once a week. 13 capsule 1  . ciprofloxacin (CIPRO) 500 MG tablet Take 500 mg by mouth 2 (two) times daily. For 7 days    . feeding supplement, ENSURE ENLIVE, (ENSURE ENLIVE) LIQD Take 237 mLs by mouth 3 (three) times daily between meals. 237 mL 12  . fluticasone (FLONASE) 50 MCG/ACT nasal spray Place 1-2 sprays into both nostrils daily. (Patient taking differently: Place 1-2 sprays into both nostrils daily as needed. ) 16 g 2  . fluticasone (FLOVENT DISKUS) 50 MCG/BLIST diskus inhaler Inhale 1 puff into the lungs 2 (two) times daily. 1 Inhaler 3  . furosemide (LASIX) 20 MG tablet Take 1 tablet (20 mg total) by mouth daily as needed. 30 tablet 2  . loperamide (IMODIUM A-D) 2 MG tablet Take 2 mg by mouth 4 (four) times daily as needed for diarrhea or loose stools.    . montelukast (SINGULAIR) 10 MG tablet Take 10 mg by mouth at bedtime.    . ondansetron (ZOFRAN) 8 MG tablet Take 8 mg by mouth every 8 (eight) hours as needed for nausea.    Marland Kitchen oxyCODONE (OXY IR/ROXICODONE) 5 MG immediate release tablet Take 5 mg by mouth 3 (three) times daily as needed for moderate pain.    Marland Kitchen oxyCODONE (OXYCONTIN) 10 mg 12 hr tablet Take 10 mg by mouth every 12 (twelve) hours as needed (severe pain).    . pantoprazole (PROTONIX) 40 MG tablet Take 1 tablet by mouth every morning.    . pravastatin (PRAVACHOL) 10 MG tablet Take 1 tablet by mouth at bedtime.    . prochlorperazine (COMPAZINE) 10 MG tablet Take 10 mg by mouth every 6 (six) hours as needed for nausea.    . pomalidomide (POMALYST) 3 MG capsule Take 1 capsule (3 mg total) by mouth daily. Take with water on days 1-21. Repeat every 28 days. (Patient not taking: Reported  on 04/20/2018) 21 capsule 0  . potassium chloride (K-DUR) 10 MEQ tablet Take 1 tablet (10 mEq total) by mouth daily. 30 tablet 0  . potassium chloride SA (K-DUR,KLOR-CON) 20 MEQ tablet Take 1 tablet (20 mEq total) by mouth daily. (Patient not taking: Reported on 04/20/2018) 14 tablet 0   No current facility-administered medications for this visit.    Facility-Administered Medications Ordered in Other Visits  Medication Dose Route Frequency Provider Last Rate Last Dose  . montelukast (SINGULAIR) tablet 10 mg  10 mg Oral QHS Earlie Server, MD   10 mg at 04/20/18 1029     PHYSICAL EXAMINATION: ECOG PERFORMANCE STATUS: 2 - Symptomatic, <50% confined to bed Vitals:   04/20/18 0849  BP: (!) 142/78  Pulse: 60  Resp: 18  Temp: (!) 97.5 F (36.4 C)   Filed Weights   04/20/18 0849  Weight: 135 lb 11.2 oz (61.6 kg)    Physical Exam  Constitutional: She is oriented to person, place, and time and well-developed, well-nourished, and in no distress. No distress.  HENT:  Head: Normocephalic and atraumatic.  Nose: Nose normal.  Mouth/Throat: Oropharynx is clear and moist. No oropharyngeal exudate.  Eyes: Pupils are equal, round, and reactive to light. EOM are normal. Left eye exhibits no discharge. No scleral icterus.  Neck: Normal range of motion. Neck supple. No JVD present. No tracheal deviation present.  Cardiovascular: Normal rate, regular rhythm and normal heart sounds.  No murmur heard. Pulmonary/Chest: Effort normal and breath sounds normal. No respiratory distress. She has no wheezes. She has no  rales. She exhibits no tenderness.  Decreased breath sounds bilaterally.   Abdominal: Soft. She exhibits no distension and no mass. There is no tenderness. There is no rebound and no guarding.  Musculoskeletal: Normal range of motion. She exhibits no edema or tenderness.  Lymphadenopathy:    She has no cervical adenopathy.  Neurological: She is alert and oriented to person, place, and time. No  cranial nerve deficit. She exhibits normal muscle tone. Coordination normal.  Skin: Skin is warm and dry. No rash noted. She is not diaphoretic. No erythema. No pallor.  Psychiatric: Memory, affect and judgment normal.     LABORATORY DATA:  I have reviewed the data as listed Lab Results  Component Value Date   WBC 3.7 04/20/2018   HGB 12.3 04/20/2018   HCT 35.8 04/20/2018   MCV 87.4 04/20/2018   PLT 124 (L) 04/20/2018   Recent Labs    03/23/18 0841 04/06/18 0823 04/20/18 0827  NA 141 139 143  K 3.8 4.1 3.3*  CL 106 107 107  CO2 _0 GLUCOSE 137* 110* 108*  BUN _1 CREATININE 0.70 0.76 0.70  CALCIUM 9.6 9.5 9.1  GFRNONAA >60 >60 >60  GFRAA >60 >60 >60  PROT 6.2* 6.5 6.3*  ALBUMIN 3.2* 3.6 3.3*  AST _2 ALT _3 ALKPHOS 75 74 60  BILITOT 0.8 0.7 1.0       ASSESSMENT & PLAN:  1. Multiple myeloma in relapse ([place])   2. Bone metastasis ([place])   3. Encounter for antineoplastic chemotherapy   4. Encounter for antineoplastic immunotherapy   5. 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 Bilateral leg edema    #Multiple myeloma :  Patient appears taking about 9 days of Pomalyst 3 mg daily before she developed urinary tract infection symptoms. I do not have her UA results or any urine culture results as this was done at  outside urgent care facility. She was treated empirically with Cipro and her symptoms have resolved. Clinically she probably had an episode of UTI. Unfortunately there was no CBC that was checked at the time of UTI to document any neutropenia at that time. I recommend patient to continue and finish her course of Cipro. Restart Pomalyst 3 mg daily today for 7 days to complete this cycle. Recommend patient to get CBC checked in a week to see if her [place] dropped down. If her [place] decreased in 7 days, need to consider Neupogen to support her immunity during the next cycle. Proceed with today's  treatment of daratumumab.  # Right upper extremity DVT, provoked due to being on Pomalyst.  Continue Eliquis 5 mg twice daily.  Tolerates well.  No acute bleeding events. # Bone Metastasis: She has not obtained dental clearance from her dentist yet.  Instead dentist sent form for patient to be cleared for dental clinic.  Given that patient periodically becomes neutropenic due to Pomalyst, I suggest to hold dental clinic for now.  Once she is cleared by dentist, will proceed with Xgeva.  # Drug induced neutropenia: Normalized today because she has been off Pomalyst. #Hypokalemia, restarted on K. Dur 10 mEq daily.  Prescription sent to patient's pharmacy.  #Bilateral lower extremity swelling, patient used to take spironolactone 25 mg which she is not currently on.  There was a Lasix prescription by PCP back in May and patient is not also not taking.  Is unclear  why she was taking diuretics for.  Patient denies any shortness of breath, any previous heart failure or cardiovascular disease.  Extensive medical history review was performed by me in care everywhere well to see if there is any documented reason for the patient to be started on spironolactone at that time.  Appears that spironolactone was added between September to October 2018.  I do not see any documented reason for this medication to be started.  No previous 2D echocardiogram has been done. We will start with checking 2D echo to rule out cardiovascular etiology.   Orders Placed This Encounter  Procedures  . CBC with Differential/Platelet    Standing Status:   Future    Standing Expiration Date:   04/21/2019  . CBC with Differential/Platelet    Standing Status:   Future    Standing Expiration Date:   04/21/2019  . Comprehensive metabolic panel    Standing Status:   Future    Standing Expiration Date:   04/21/2019  . Multiple Myeloma Panel (SPEP&IFE w/QIG)    Standing Status:   Future    Standing Expiration Date:   04/21/2019  .  ECHOCARDIOGRAM COMPLETE    Standing Status:   Future    Standing Expiration Date:   07/22/2019    Order Specific Question:   Where should this test be performed    Answer:   [facility]    Order Specific Question:   Perflutren DEFINITY (image enhancing agent) should be administered unless hypersensitivity or allergy exist    Answer:   Administer Perflutren    Order Specific Question:   Expected Date:    Answer:   1 week   All questions were answered. The patient knows to call the clinic with any problems questions or concerns. Return of visit: 2 weeks Total face to face encounter time for this patient visit was 45 min. >50% of the time was  spent in counseling and coordination of care.     Earlie Server, MD, PhD Hematology Oncology [facility] Pager- [phone] 04/20/2018

## 2018-04-26 ENCOUNTER — Inpatient Hospital Stay (HOSPITAL_BASED_OUTPATIENT_CLINIC_OR_DEPARTMENT_OTHER): Payer: Medicare PPO | Admitting: Oncology

## 2018-04-26 ENCOUNTER — Encounter: Payer: Self-pay | Admitting: Oncology

## 2018-04-26 ENCOUNTER — Inpatient Hospital Stay: Payer: Medicare PPO

## 2018-04-26 ENCOUNTER — Other Ambulatory Visit: Payer: Self-pay

## 2018-04-26 VITALS — BP 120/65 | HR 57 | Temp 97.8°F | Wt 141.0 lb

## 2018-04-26 DIAGNOSIS — Z803 Family history of malignant neoplasm of breast: Secondary | ICD-10-CM

## 2018-04-26 DIAGNOSIS — C9002 Multiple myeloma in relapse: Secondary | ICD-10-CM

## 2018-04-26 DIAGNOSIS — K219 Gastro-esophageal reflux disease without esophagitis: Secondary | ICD-10-CM

## 2018-04-26 DIAGNOSIS — R5381 Other malaise: Secondary | ICD-10-CM

## 2018-04-26 DIAGNOSIS — T451X5S Adverse effect of antineoplastic and immunosuppressive drugs, sequela: Secondary | ICD-10-CM | POA: Diagnosis not present

## 2018-04-26 DIAGNOSIS — J449 Chronic obstructive pulmonary disease, unspecified: Secondary | ICD-10-CM

## 2018-04-26 DIAGNOSIS — Z79899 Other long term (current) drug therapy: Secondary | ICD-10-CM

## 2018-04-26 DIAGNOSIS — I82721 Chronic embolism and thrombosis of deep veins of right upper extremity: Secondary | ICD-10-CM

## 2018-04-26 DIAGNOSIS — Z87891 Personal history of nicotine dependence: Secondary | ICD-10-CM

## 2018-04-26 DIAGNOSIS — Z8042 Family history of malignant neoplasm of prostate: Secondary | ICD-10-CM

## 2018-04-26 DIAGNOSIS — R5383 Other fatigue: Secondary | ICD-10-CM

## 2018-04-26 DIAGNOSIS — R600 Localized edema: Secondary | ICD-10-CM

## 2018-04-26 DIAGNOSIS — C7951 Secondary malignant neoplasm of bone: Secondary | ICD-10-CM | POA: Diagnosis not present

## 2018-04-26 DIAGNOSIS — Z87440 Personal history of urinary (tract) infections: Secondary | ICD-10-CM

## 2018-04-26 DIAGNOSIS — Z5111 Encounter for antineoplastic chemotherapy: Secondary | ICD-10-CM

## 2018-04-26 DIAGNOSIS — G620 Drug-induced polyneuropathy: Secondary | ICD-10-CM | POA: Diagnosis not present

## 2018-04-26 DIAGNOSIS — D702 Other drug-induced agranulocytosis: Secondary | ICD-10-CM

## 2018-04-26 DIAGNOSIS — M549 Dorsalgia, unspecified: Secondary | ICD-10-CM

## 2018-04-26 DIAGNOSIS — I10 Essential (primary) hypertension: Secondary | ICD-10-CM

## 2018-04-26 DIAGNOSIS — Z5112 Encounter for antineoplastic immunotherapy: Secondary | ICD-10-CM | POA: Diagnosis not present

## 2018-04-26 DIAGNOSIS — E785 Hyperlipidemia, unspecified: Secondary | ICD-10-CM

## 2018-04-26 LAB — CBC WITH DIFFERENTIAL/PLATELET
Basophils Absolute: 0 10*3/uL (ref 0–0.1)
Basophils Relative: 1 %
EOS ABS: 0.2 10*3/uL (ref 0–0.7)
EOS PCT: 8 %
HCT: 33.8 % — ABNORMAL LOW (ref 35.0–47.0)
Hemoglobin: 11.5 g/dL — ABNORMAL LOW (ref 12.0–16.0)
LYMPHS PCT: 25 %
Lymphs Abs: 0.6 10*3/uL — ABNORMAL LOW (ref 1.0–3.6)
MCH: 30 pg (ref 26.0–34.0)
MCHC: 34.1 g/dL (ref 32.0–36.0)
MCV: 88.2 fL (ref 80.0–100.0)
Monocytes Absolute: 0.9 10*3/uL (ref 0.2–0.9)
Monocytes Relative: 38 %
Neutro Abs: 0.7 10*3/uL — ABNORMAL LOW (ref 1.4–6.5)
Neutrophils Relative %: 28 %
PLATELETS: 161 10*3/uL (ref 150–440)
RBC: 3.84 MIL/uL (ref 3.80–5.20)
RDW: 15.1 % — ABNORMAL HIGH (ref 11.5–14.5)
WBC: 2.4 10*3/uL — AB (ref 3.6–11.0)

## 2018-04-26 MED ORDER — FILGRASTIM 300 MCG/0.5ML IJ SOSY
300.0000 ug | PREFILLED_SYRINGE | Freq: Once | INTRAMUSCULAR | Status: DC
  Filled 2018-04-26: qty 0.5

## 2018-04-26 MED ORDER — FILGRASTIM 300 MCG/0.5ML IJ SOSY
300.0000 ug | PREFILLED_SYRINGE | Freq: Once | INTRAMUSCULAR | Status: AC
  Administered 2018-04-26: 300 ug via SUBCUTANEOUS
  Filled 2018-04-26: qty 0.5

## 2018-04-26 NOTE — Progress Notes (Signed)
Patient here today with bilateral lower extremity swelling.  Patient denies any shortness of breath, patient has a 6 lb increase from last week.

## 2018-04-26 NOTE — Progress Notes (Signed)
Neupogen 300 mcg/1 mL verified with [place] pharmacy and given to patient.

## 2018-04-26 NOTE — Progress Notes (Signed)
Hematology/Oncology Follow up note [facility] Telephone:([phone] Fax:(336) [phone]   Patient Care Team: McLean-Scocuzza, Nino Glow, MD as PCP - General (Internal Medicine)  REFERRING PROVIDER: Dr. Baruch Gouty, Donette  REASON FOR VISIT Follow up for treatment of multiple myeloma.   HISTORY OF PRESENTING ILLNESS:  Cassie Jones is a  72 y.o.  female with PMH listed below who was referred to me for evaluation of multiple myeloma.  Patient used to follow-up with local oncologist Dr. Tonia Brooms and [facility] oncologist Dr. Amalia Hailey.  Patient tells me that as her husband works every day and has a busy working schedule, she is not able to get transportation to her treatment.  As an alternative she is now living with her brother who is close to [facility] [place] and want to transfer care here. Extensive medical record review was performed.    Patient was diagnosed with IgG lambda, ISS2, t (4,14) multiple myeloma on February 03, 2017, she has hemoglobin of 9, calcium 8.9, LDH 132, beta microglobulin 4.6, albumin 3.2.  Skeletal survey negative.  PET scan with single nonspecific humeral lesion and a thyroid nodule.  Marrow with 60% plasmacytosis by CD138 IHC, FISH with hyperdiploidy t(4, 14), 1 q. gain.. Per note, she was treated with CyborD (M spike 3.6 at start)  from 01/2017 to 02/2017. Her treatment was switched to Revlimid Velcade dexamethasone from 02/2017, M spike 1.2 at start,and was found to have progression of disease in 09/2017, when her M spike increased to 1.8, and developed compression fracture in 10/2016. She was restarted on RVD using weekly Velcade.  She was recently seen by Dr. Boris Sharper and recommended to start on a combination of daratumumab, Pomalyst, dexamethasone. Per patient she received her first daratumumab last week with split dose 77m/kg given on 3/14 and 3/15.  Patient reports that she tolerate the treatment well except mild infusion  reaction.  # Patient also reports that she has got dental clearance and has been given 1 dose of Zometa by Dr. VRemus Loffler(unkown dates).  [facility]  # Patient was seen by nurse practitioner last week prior to her cycle 2 daratumumab.  She reports feeling shortness of breath, having cough, believed to have bronchitis and was prescribed Levaquin 500 mg p.o. daily for 7 days, and albuterol as needed.  She also had a VQ scan low probability for pulmonary embolism.  # 01/29/2018 5th weekly treatment of Daratumumab,  Cycle 2 Pomalyst was started for a week and was nterrupted due to acute bronchitis treated with outpatient Azithromycin.    # 02/16/2018 developed right upper extremity swelling and tenderness, confirmed to be DVT. She was taking Aspirin.  Started on Eliquis. Aspirin is stopped.  # 02/16/2018 6th Daratumumab treatment. Was started on Cycle 3 Pomalyst, she has completed about 2 weeks treatment. She was admitted to hospital on 02/21/2018 for Klebsiella UTI and oncology was not consulted. She was treatment with antibiotics and continued on Pomalyst. She was hospitalized again on 03/02/2018 for pneumonia, COPD exacerbation/emphasema and oncology was consulted and patient was seen by my colleague Dr.Finnergan and advise patient to stop Pomalyst.   # [facility] recommends dose reduced Pomalyst 341m This was previously discussed with patient, and she was reluctant to be restarted on Pomalyst. Today she seems to be more acceptable for restarting Pomalyst, but requests to start in a few weeks so that she can  have a nice break.   #Previously declined bone marrow transplant evaluation.  INTERVAL HISTORY Cassie Jones is a 72 y.o. female who has above history reviewed by me today presents for lab recheck and reports worsening lower extremity and requests to be seen.  She denies any fever, chills, any  urinary symptoms today. Feels well.  Lower extremity swelling bilateral, worsened since last visit. She reports she is taking Spirolacton 42m daily. Not on Lasix.  2D echo is scheduled on 7/18.   #Multiple myeloma, she has restarted on Pomalyst 3 mg daily, finishing this current cycle , Dose was disrreupted for 4 days due to UTI and she was instructed to restart and finish this cycle.  Not associated with fever or chills.    #Back pain, stable. #Fatigue:stable.  #Shortness of breath: chronic, stable.   Data reviewed:  Multiple myeloma labs obtained at last visit:  SPEP showed VGPR with >90% reduction of her M protein. Normalized free light chain ratio.   Current Treatment  # 3/14, 3/15 at outside facility, she got split dose for first treatment of daratumumab. #  01/08/2018 Start on weekly daratumumab 152mm2, dexamethasone 2027m S/p # Palliative RT to spine. 01/08/2018 started cycle 1 Pomalyst 4mg70my 1-21 ogf 28 day cycle.  Pomalyst 4 mg was held after 3 cycles with multiple interruptions due to neutropenia and recurrent pneumonia.  Pomalyst was restarted at a lower dose of 3 mg on 04/06/2018.  Current Pain medication: Oxycodone 5mg 7m as needed. Oxycontin 10mg 41m.   Review of Systems  Constitutional: Positive for malaise/fatigue. Negative for chills, fever and weight loss.  HENT: Negative for congestion, ear discharge, ear pain, hearing loss, nosebleeds, sinus pain and sore throat.   Eyes: Negative for blurred vision, double vision, photophobia, pain, discharge and redness.  Respiratory: Negative for cough, hemoptysis, sputum production, shortness of breath and wheezing.   Cardiovascular: Negative for chest pain, palpitations, orthopnea, claudication and leg swelling.  Gastrointestinal: Negative for abdominal pain, blood in stool, constipation, diarrhea, heartburn, melena, nausea and vomiting.  Genitourinary: Negative for dysuria, flank pain, frequency, hematuria and urgency.    Musculoskeletal: Positive for back pain. Negative for joint pain, myalgias and neck pain.       Leg swelling   Skin: Negative for itching and rash.  Neurological: Negative for dizziness, tingling, tremors, sensory change, speech change, focal weakness, weakness and headaches.  Endo/Heme/Allergies: Negative for environmental allergies. Does not bruise/bleed easily.  Psychiatric/Behavioral: Negative for depression, hallucinations, substance abuse and suicidal ideas. The patient is not nervous/anxious.     MEDICAL HISTORY:  Past Medical History:  Diagnosis Date  . Ascites   . Asthma   . Bilateral leg edema   . [place]   . Colon poly[place]   . Diverticulitis    with perforation 02/2017 and hosp with colostomy bag s/p removal   . Drug-induced neutropenia (HCC) 5[place]/2019  . GERD (gastroesophageal reflux disease)   . Hyperlipidemia   . Hypertension   . Multiple myeloma (HCC)  [place] [facility] . Pleural effusion   . Pneumonia    03/02/18   . Thyroid nodule   . UTI (urinary tract infection)   . Vitamin D deficiency     SURGICAL HISTORY: Past Surgical History:  Procedure Laterality Date  . ABDOMINAL HYSTERECTOMY    . BREAST BIOPSY Bilateral yrs ago   benign  . CHOLECYSTECTOMY    . COLOSTOMY  2018  . COLOSTOMY REVERSAL     11 2018  . KYPHOPLASTY     11/2017 [place]     SOCIAL HISTORY: Social History   Socioeconomic History  . Marital status: Married    Spouse name: Not on file  . Number of children: Not on file  . Years of education: Not on file  . Highest education level: Not on file  Occupational History  . Not on file  Social Needs  . Financial resource strain: Not hard at all  . Food insecurity:    Worry: Never true    Inability: Never true  . Transportation needs:    Medical: No    Non-medical: No  Tobacco Use  . Smoking status: Former Research scientist (life sciences)  . Smokeless tobacco: Never Used  Substance and Sexual Activity   . Alcohol use: Not Currently  . Drug use: Never  . Sexual activity: Yes    Comment: husband   Lifestyle  . Physical activity:    Days per week: 0 days    Minutes per session: 0 min  . Stress: Not at all  Relationships  . Social connections:    Talks on phone: More than three times a week    Gets together: Once a week    Attends religious service: 1 to 4 times per year    Active member of club or organization: No    Attends meetings of clubs or organizations: Never    Relationship status: Married  . Intimate partner violence:    Fear of current or ex partner: No    Emotionally abused: No    Physically abused: No    Forced sexual activity: No  Other Topics Concern  . Not on file  Social History Narrative   Married    Husband lives in [place] while she gets treatment in this area    She lives alone in this area with family close by     FAMILY HISTORY: Family History  Problem Relation Age of Onset  . Breast cancer Mother 52  . Cancer Mother        breast   . Diabetes Mother   . Heart disease Mother   . Hyperlipidemia Mother   . Hypertension Mother   . Birth defects Brother   . Diabetes Brother   . Heart disease Brother   . Hyperlipidemia Brother   . Cancer Father        prostate met to liver     ALLERGIES:  is allergic to codeine.  MEDICATIONS:  Current Outpatient Medications  Medication Sig Dispense Refill  . acetaminophen (TYLENOL) 500 MG tablet Take 1,000 mg by mouth every 6 (six) hours as needed for mild pain or fever.    Marland Kitchen albuterol (VENTOLIN HFA) 108 (90 Base) MCG/ACT inhaler Inhale 2 puffs into the lungs every 6 (six) hours as needed. 1 Inhaler 1  . apixaban (ELIQUIS) 5 MG TABS tablet Take 1 tablet (5 mg total) by mouth 2 (two) times daily. 60 tablet 3  . Cholecalciferol 50000 units capsule Take 1 capsule (50,000 Units total) by mouth once a week. 13 capsule 1  . feeding supplement, ENSURE ENLIVE, (ENSURE ENLIVE) LIQD Take 237 mLs by mouth 3 (three)  times daily between meals. 237 mL 12  . fluticasone (FLONASE) 50 MCG/ACT nasal spray Place 1-2 sprays into both nostrils daily. (Patient taking differently: Place 1-2 sprays into both nostrils daily as needed. ) 16 g 2  . fluticasone (FLOVENT DISKUS) 50 MCG/BLIST diskus  inhaler Inhale 1 puff into the lungs 2 (two) times daily. 1 Inhaler 3  . furosemide (LASIX) 20 MG tablet Take 1 tablet (20 mg total) by mouth daily as needed. 30 tablet 2  . loperamide (IMODIUM A-D) 2 MG tablet Take 2 mg by mouth 4 (four) times daily as needed for diarrhea or loose stools.    . montelukast (SINGULAIR) 10 MG tablet Take 10 mg by mouth at bedtime.    . ondansetron (ZOFRAN) 8 MG tablet Take 8 mg by mouth every 8 (eight) hours as needed for nausea.    Marland Kitchen oxyCODONE (OXY IR/ROXICODONE) 5 MG immediate release tablet Take 5 mg by mouth 3 (three) times daily as needed for moderate pain.    Marland Kitchen oxyCODONE (OXYCONTIN) 10 mg 12 hr tablet Take 10 mg by mouth every 12 (twelve) hours as needed (severe pain).    . pantoprazole (PROTONIX) 40 MG tablet Take 1 tablet by mouth every morning.    . pomalidomide (POMALYST) 3 MG capsule Take 1 capsule (3 mg total) by mouth daily. Take with water on days 1-21. Repeat every 28 days. 21 capsule 0  . potassium chloride (K-DUR) 10 MEQ tablet Take 1 tablet (10 mEq total) by mouth daily. 30 tablet 0  . potassium chloride SA (K-DUR,KLOR-CON) 20 MEQ tablet Take 1 tablet (20 mEq total) by mouth daily. 14 tablet 0  . pravastatin (PRAVACHOL) 10 MG tablet Take 1 tablet by mouth at bedtime.    . prochlorperazine (COMPAZINE) 10 MG tablet Take 10 mg by mouth every 6 (six) hours as needed for nausea.     No current facility-administered medications for this visit.    Facility-Administered Medications Ordered in Other Visits  Medication Dose Route Frequency Provider Last Rate Last Dose  . montelukast (SINGULAIR) tablet 10 mg  10 mg Oral QHS Earlie Server, MD   10 mg at 04/20/18 1029     PHYSICAL  EXAMINATION: ECOG PERFORMANCE STATUS: 2 - Symptomatic, <50% confined to bed Vitals:   04/26/18 1132  BP: 120/65  Pulse: (!) 57  Temp: 97.8 F (36.6 C)  SpO2: 99%   Filed Weights   04/26/18 1132  Weight: 141 lb (64 kg)    Physical Exam  Constitutional: She is oriented to person, place, and time and well-developed, well-nourished, and in no distress. No distress.  HENT:  Head: Normocephalic and atraumatic.  Nose: Nose normal.  Mouth/Throat: Oropharynx is clear and moist. No oropharyngeal exudate.  Eyes: Pupils are equal, round, and reactive to light. EOM are normal. Left eye exhibits no discharge. No scleral icterus.  Neck: Normal range of motion. Neck supple. No JVD present. No tracheal deviation present.  Cardiovascular: Normal rate, regular rhythm and normal heart sounds.  No murmur heard. Pulmonary/Chest: Effort normal and breath sounds normal. No respiratory distress. She has no wheezes. She has no rales. She exhibits no tenderness.  Decreased breath sounds bilaterally.   Abdominal: Soft. She exhibits no distension and no mass. There is no tenderness. There is no rebound and no guarding.  Musculoskeletal: Normal range of motion. She exhibits edema. She exhibits no tenderness.  Bilateral 3+ edema.   Lymphadenopathy:    She has no cervical adenopathy.  Neurological: She is alert and oriented to person, place, and time. No cranial nerve deficit. She exhibits normal muscle tone. Coordination normal.  Skin: Skin is warm and dry. No rash noted. She is not diaphoretic. No erythema. No pallor.  Psychiatric: Memory, affect and judgment normal.     LABORATORY  DATA:  I have reviewed the data as listed Lab Results  Component Value Date   WBC 2.4 (L) 04/26/2018   HGB 11.5 (L) 04/26/2018   HCT 33.8 (L) 04/26/2018   MCV 88.2 04/26/2018   PLT 161 04/26/2018   Recent Labs    03/23/18 0841 04/06/18 0823 04/20/18 0827  NA 141 139 143  K 3.8 4.1 3.3*  CL 106 107 107  CO2 _0 GLUCOSE 137* 110* 108*  BUN _1 CREATININE 0.70 0.76 0.70  CALCIUM 9.6 9.5 9.1  GFRNONAA >60 >60 >60  GFRAA >60 >60 >60  PROT 6.2* 6.5 6.3*  ALBUMIN 3.2* 3.6 3.3*  AST _2 ALT _3 ALKPHOS 75 74 60  BILITOT 0.8 0.7 1.0       ASSESSMENT & PLAN:  1. Multiple myeloma in relapse ([place])   2. Drug-induced neutropenia (HCC)   3. Bilateral leg edema    #She continues to have drug induced neutropenia, ANC is 0.7 after restarted back on Pomalyst 3m daily.  Will give her 1 dose of Neupogen today.  She will need G CSF support next cycle.  .Marland Kitchen #Bilateral lower extremity swelling, check 2D echo. Advise patient to continue spirolactone. She may also use Lasix that was prescribe by PCP. Advise patient to double her potassium dose to 258m on the days she is on Lasix. She voices understanding.   All questions were answered. The patient knows to call the clinic with any problems questions or concerns. Return of visit: 1week.  Total face to face encounter time for this patient visit was 256m >50% of the time was  spent in counseling and coordination of care.     ZhoEarlie ServerD, PhD Hematology Oncology [facility] [id number]

## 2018-04-27 ENCOUNTER — Other Ambulatory Visit: Payer: Medicare PPO

## 2018-05-03 ENCOUNTER — Ambulatory Visit
Admission: RE | Admit: 2018-05-03 | Discharge: 2018-05-03 | Disposition: A | Discharge status: Home / Self Care | Payer: Medicare PPO | Source: Ambulatory Visit | Attending: Oncology | Admitting: Oncology

## 2018-05-03 DIAGNOSIS — E785 Hyperlipidemia, unspecified: Secondary | ICD-10-CM | POA: Insufficient documentation

## 2018-05-03 DIAGNOSIS — R600 Localized edema: Secondary | ICD-10-CM | POA: Diagnosis not present

## 2018-05-03 DIAGNOSIS — C9000 Multiple myeloma not having achieved remission: Secondary | ICD-10-CM | POA: Diagnosis not present

## 2018-05-03 DIAGNOSIS — J90 Pleural effusion, not elsewhere classified: Secondary | ICD-10-CM | POA: Diagnosis not present

## 2018-05-03 DIAGNOSIS — I10 Essential (primary) hypertension: Secondary | ICD-10-CM | POA: Diagnosis not present

## 2018-05-03 NOTE — Progress Notes (Signed)
*  PRELIMINARY RESULTS* Echocardiogram 2D Echocardiogram has been performed.  Cassie Jones Cassie Jones 05/03/2018, 11:47 AM

## 2018-05-04 ENCOUNTER — Inpatient Hospital Stay (HOSPITAL_BASED_OUTPATIENT_CLINIC_OR_DEPARTMENT_OTHER): Payer: Medicare PPO | Admitting: Oncology

## 2018-05-04 ENCOUNTER — Inpatient Hospital Stay: Payer: Medicare PPO

## 2018-05-04 ENCOUNTER — Encounter: Payer: Self-pay | Admitting: Oncology

## 2018-05-04 ENCOUNTER — Other Ambulatory Visit: Payer: Self-pay

## 2018-05-04 VITALS — BP 125/72 | HR 159 | Temp 97.9°F | Resp 18 | Wt 137.8 lb

## 2018-05-04 DIAGNOSIS — Z5111 Encounter for antineoplastic chemotherapy: Secondary | ICD-10-CM

## 2018-05-04 DIAGNOSIS — Z5112 Encounter for antineoplastic immunotherapy: Secondary | ICD-10-CM | POA: Diagnosis not present

## 2018-05-04 DIAGNOSIS — R600 Localized edema: Secondary | ICD-10-CM

## 2018-05-04 DIAGNOSIS — E785 Hyperlipidemia, unspecified: Secondary | ICD-10-CM

## 2018-05-04 DIAGNOSIS — G620 Drug-induced polyneuropathy: Secondary | ICD-10-CM | POA: Diagnosis not present

## 2018-05-04 DIAGNOSIS — C7951 Secondary malignant neoplasm of bone: Secondary | ICD-10-CM

## 2018-05-04 DIAGNOSIS — C9002 Multiple myeloma in relapse: Secondary | ICD-10-CM

## 2018-05-04 DIAGNOSIS — K219 Gastro-esophageal reflux disease without esophagitis: Secondary | ICD-10-CM

## 2018-05-04 DIAGNOSIS — D702 Other drug-induced agranulocytosis: Secondary | ICD-10-CM

## 2018-05-04 DIAGNOSIS — Z803 Family history of malignant neoplasm of breast: Secondary | ICD-10-CM

## 2018-05-04 DIAGNOSIS — M549 Dorsalgia, unspecified: Secondary | ICD-10-CM

## 2018-05-04 DIAGNOSIS — I10 Essential (primary) hypertension: Secondary | ICD-10-CM

## 2018-05-04 DIAGNOSIS — M7989 Other specified soft tissue disorders: Secondary | ICD-10-CM

## 2018-05-04 DIAGNOSIS — Z87440 Personal history of urinary (tract) infections: Secondary | ICD-10-CM

## 2018-05-04 DIAGNOSIS — T451X5S Adverse effect of antineoplastic and immunosuppressive drugs, sequela: Secondary | ICD-10-CM

## 2018-05-04 DIAGNOSIS — R5383 Other fatigue: Secondary | ICD-10-CM

## 2018-05-04 DIAGNOSIS — R5381 Other malaise: Secondary | ICD-10-CM

## 2018-05-04 DIAGNOSIS — Z87891 Personal history of nicotine dependence: Secondary | ICD-10-CM

## 2018-05-04 DIAGNOSIS — Z8042 Family history of malignant neoplasm of prostate: Secondary | ICD-10-CM

## 2018-05-04 DIAGNOSIS — I82721 Chronic embolism and thrombosis of deep veins of right upper extremity: Secondary | ICD-10-CM

## 2018-05-04 DIAGNOSIS — J449 Chronic obstructive pulmonary disease, unspecified: Secondary | ICD-10-CM

## 2018-05-04 DIAGNOSIS — Z79899 Other long term (current) drug therapy: Secondary | ICD-10-CM

## 2018-05-04 LAB — COMPREHENSIVE METABOLIC PANEL
ALBUMIN: 3.6 g/dL (ref 3.5–5.0)
ALT: 17 U/L (ref 0–44)
ANION GAP: 8 (ref 5–15)
AST: 15 U/L (ref 15–41)
Alkaline Phosphatase: 66 U/L (ref 38–126)
BUN: 16 mg/dL (ref 8–23)
CALCIUM: 10.1 mg/dL (ref 8.9–10.3)
CHLORIDE: 109 mmol/L (ref 98–111)
CO2: 24 mmol/L (ref 22–32)
Creatinine, Ser: 0.76 mg/dL (ref 0.44–1.00)
GFR calc Af Amer: >60 mL/min (ref >60)
GFR calc non Af Amer: >60 mL/min (ref >60)
GLUCOSE: 105 mg/dL — AB (ref 70–99)
POTASSIUM: 4.5 mmol/L (ref 3.5–5.1)
SODIUM: 141 mmol/L (ref 135–145)
TOTAL PROTEIN: 6.5 g/dL (ref 6.5–8.1)
Total Bilirubin: 0.8 mg/dL (ref 0.3–1.2)

## 2018-05-04 LAB — CBC WITH DIFFERENTIAL/PLATELET
BASOS ABS: 0.1 10*3/uL (ref 0–0.1)
BASOS PCT: 2 %
EOS PCT: 1 %
Eosinophils Absolute: 0 10*3/uL (ref 0–0.7)
HEMATOCRIT: 36.6 % (ref 35.0–47.0)
Hemoglobin: 12.5 g/dL (ref 12.0–16.0)
LYMPHS PCT: 28 %
Lymphs Abs: 1.2 10*3/uL (ref 1.0–3.6)
MCH: 30.3 pg (ref 26.0–34.0)
MCHC: 34.3 g/dL (ref 32.0–36.0)
MCV: 88.5 fL (ref 80.0–100.0)
Monocytes Absolute: 1.3 10*3/uL — ABNORMAL HIGH (ref 0.2–0.9)
Monocytes Relative: 29 %
NEUTROS ABS: 1.8 10*3/uL (ref 1.4–6.5)
Neutrophils Relative %: 40 %
PLATELETS: 273 10*3/uL (ref 150–440)
RBC: 4.13 MIL/uL (ref 3.80–5.20)
RDW: 14.7 % — ABNORMAL HIGH (ref 11.5–14.5)
WBC: 4.4 10*3/uL (ref 3.6–11.0)

## 2018-05-04 MED ORDER — PROCHLORPERAZINE MALEATE 10 MG PO TABS
10.0000 mg | ORAL_TABLET | Freq: Once | ORAL | Status: AC
  Administered 2018-05-04: 10 mg via ORAL
  Filled 2018-05-04: qty 1

## 2018-05-04 MED ORDER — SODIUM CHLORIDE 0.9 % IV SOLN: 16.0000 mg/kg | Freq: Once | INTRAVENOUS | Status: DC

## 2018-05-04 MED ORDER — SODIUM CHLORIDE 0.9 % IV SOLN
20.0000 mg | Freq: Once | INTRAVENOUS | Status: AC
  Administered 2018-05-04: 20 mg via INTRAVENOUS
  Filled 2018-05-04: qty 2

## 2018-05-04 MED ORDER — DIPHENHYDRAMINE HCL 25 MG PO CAPS
50.0000 mg | ORAL_CAPSULE | Freq: Once | ORAL | Status: AC
  Administered 2018-05-04: 50 mg via ORAL
  Filled 2018-05-04: qty 2

## 2018-05-04 MED ORDER — DARATUMUMAB CHEMO INJECTION 400 MG/20ML
900.0000 mg | Freq: Once | INTRAVENOUS | Status: AC
  Administered 2018-05-04: 900 mg via INTRAVENOUS
  Filled 2018-05-04: qty 40

## 2018-05-04 MED ORDER — ACETAMINOPHEN 325 MG PO TABS
650.0000 mg | ORAL_TABLET | Freq: Once | ORAL | Status: AC
  Administered 2018-05-04: 650 mg via ORAL
  Filled 2018-05-04: qty 2

## 2018-05-04 MED ORDER — SODIUM CHLORIDE 0.9 % IV SOLN
Freq: Once | INTRAVENOUS | Status: AC
  Administered 2018-05-04: 11:00:00 via INTRAVENOUS
  Filled 2018-05-04: qty 1000

## 2018-05-04 MED ORDER — PROCHLORPERAZINE MALEATE 5 MG PO TABS: 10.0000 mg | ORAL_TABLET | Freq: Once | ORAL | Status: DC

## 2018-05-04 NOTE — Progress Notes (Signed)
Hematology/Oncology Follow up note [facility] [phone] [phone]   Patient Care Team: McLean-Scocuzza, Nino Glow, MD as PCP - General (Internal Medicine)  REFERRING PROVIDER: Dr. Baruch Gouty, Donette  REASON FOR VISIT Follow up for treatment of multiple myeloma.   HISTORY OF PRESENTING ILLNESS:  Cassie Jones is a  72 y.o.  female with PMH listed below who was referred to me for evaluation of multiple myeloma.  Patient used to follow-up with local oncologist Dr. Tonia Brooms and [facility] oncologist Dr. Amalia Hailey.  Patient tells me that as her husband works every day and has a busy working schedule, she is not able to get transportation to her treatment.  As an alternative she is now living with her brother who is close to [facility] regional [place] and want to transfer care here. Extensive medical record review was performed.    Patient was diagnosed with IgG lambda, ISS2, t (4,14) multiple myeloma on February 03, 2017, she has hemoglobin of 9, calcium 8.9, LDH 132, beta microglobulin 4.6, albumin 3.2.  Skeletal survey negative.  PET scan with single nonspecific humeral lesion and a thyroid nodule.  Marrow with 60% plasmacytosis by CD138 IHC, FISH with hyperdiploidy t(4, 14), 1 q. gain.. Per note, she was treated with CyborD (M spike 3.6 at start)  from 01/2017 to 02/2017. Her treatment was switched to Revlimid Velcade dexamethasone from 02/2017, M spike 1.2 at start,and was found to have progression of disease in 09/2017, when her M spike increased to 1.8, and developed compression fracture in 10/2016. She was restarted on RVD using weekly Velcade.  She was recently seen by Dr. Boris Sharper and recommended to start on a combination of daratumumab, Pomalyst, dexamethasone. Per patient she received her first daratumumab last week with split dose 40m/kg given on 3/14 and 3/15.  Patient reports that she tolerate the treatment well except mild infusion  reaction.  # Patient also reports that she has got dental clearance and has been given 1 dose of Zometa by Dr. VRemus Loffler(unkown dates).  [facility]  # Patient was seen by nurse practitioner last week prior to her cycle 2 daratumumab.  She reports feeling shortness of breath, having cough, believed to have bronchitis and was prescribed Levaquin 500 mg p.o. daily for 7 days, and albuterol as needed.  She also had a VQ scan low probability for pulmonary embolism.  # 01/29/2018 5th weekly treatment of Daratumumab,  Cycle 2 Pomalyst was started for a week and was nterrupted due to acute bronchitis treated with outpatient Azithromycin.    # 02/16/2018 developed right upper extremity swelling and tenderness, confirmed to be DVT. She was taking Aspirin.  Started on Eliquis. Aspirin is stopped.  # 02/16/2018 6th Daratumumab treatment. Was started on Cycle 3 Pomalyst, she has completed about 2 weeks treatment. She was admitted to hospital on 02/21/2018 for Klebsiella UTI and oncology was not consulted. She was treatment with antibiotics and continued on Pomalyst. She was hospitalized again on 03/02/2018 for pneumonia, COPD exacerbation/emphasema and oncology was consulted and patient was seen by my colleague Dr.Finnergan and advise patient to stop Pomalyst.   # [facility] recommends dose reduced Pomalyst 391m This was previously discussed with patient, and she was reluctant to be restarted on Pomalyst. Today she seems to be more acceptable for restarting Pomalyst, but requests to start in a few weeks so that she can  have a nice break.   #Previously declined bone marrow transplant evaluation. # Cycle 3 Pomalyst 35m,  disrreupted for 4 days due to UTI and she was instructed to restart and able to finish this cycle.    INTERVAL HISTORY Cassie Jones a 72y.o. female who has above history reviewed by me  today presents for assessment prior chemotherapy and anti neoplasm immunotherapy for multiple myeloma.   She reports feeling well.  Continue to have lower extremity swelling, not better or worse.  Denies any fever, chills, urinary symptoms.  She brought deep clean procedure clearance form her dentist which needs to be signed prior to her deep clean this Friday.   During the interval 2D echo was done LVEF 55-60%.  #Multiple myeloma, she is about to start Cycle 4 pomalyst.   #Back pain, stable..Marland Kitchen#Fatigue:stable.  #Shortness of breath: chronic stable.   Data reviewed:  Multiple myeloma labs obtained at last visit:  SPEP showed VGPR with >90% reduction of her M protein. Normalized free light chain ratio.   Current Treatment  # 3/14, 3/15 at outside facility, she got split dose for first treatment of daratumumab. #  01/08/2018 Start on weekly daratumumab 172mm2, dexamethasone 2077m S/p # Palliative RT to spine. 01/08/2018 started cycle 1 Pomalyst 4mg63my 1-21 ogf 28 day cycle.  Pomalyst 4 mg was held after 3 cycles with multiple interruptions due to neutropenia and recurrent pneumonia.  Pomalyst was restarted at a lower dose of 3 mg on 04/06/2018.  Current Pain medication: Oxycodone 5mg 45m as needed. Oxycontin 10mg 67m.   Review of Systems  Constitutional: Positive for malaise/fatigue. Negative for chills, fever and weight loss.  HENT: Negative for congestion, ear discharge, ear pain, hearing loss, nosebleeds, sinus pain and sore throat.   Eyes: Negative for blurred vision, double vision, photophobia, pain, discharge and redness.  Respiratory: Negative for cough, hemoptysis, sputum production, shortness of breath and wheezing.   Cardiovascular: Negative for chest pain, palpitations, orthopnea, claudication and leg swelling.  Gastrointestinal: Negative for abdominal pain, blood in stool, constipation, diarrhea, heartburn, melena, nausea and vomiting.  Genitourinary: Negative for  dysuria, flank pain, frequency, hematuria and urgency.  Musculoskeletal: Positive for back pain. Negative for joint pain, myalgias and neck pain.       Leg swelling   Skin: Negative for itching and rash.  Neurological: Negative for dizziness, tingling, tremors, sensory change, speech change, focal weakness, weakness and headaches.  Endo/Heme/Allergies: Negative for environmental allergies. Does not bruise/bleed easily.  Psychiatric/Behavioral: Negative for depression, hallucinations, substance abuse and suicidal ideas. The patient is not nervous/anxious.     MEDICAL HISTORY:  Past Medical History:  Diagnosis Date  . Ascites   . Asthma   . Bilateral leg edema   . [place]   . Colon polyps   . Diverticulitis    with perforation 02/2017 and hosp with colostomy bag s/p removal   . Drug-induced neutropenia (HCC) [place]/2019  . GERD (gastroesophageal reflux disease)   . Hyperlipidemia   . Hypertension   . Multiple myeloma (HCC)  [place] 01[place]2018 [facility] . Pleural effusion   . Pneumonia    03/02/18   . Thyroid nodule   . UTI (urinary tract infection)   . Vitamin D deficiency     SURGICAL HISTORY: Past Surgical History:  Procedure Laterality Date  . ABDOMINAL HYSTERECTOMY    . BREAST BIOPSY Bilateral yrs ago  benign  . CHOLECYSTECTOMY    . COLOSTOMY  2018  . COLOSTOMY REVERSAL     11 2018  . KYPHOPLASTY     11/2017 [place]     SOCIAL HISTORY: Social History   Socioeconomic History  . Marital status: Married    Spouse name: Not on file  . Number of children: Not on file  . Years of education: Not on file  . Highest education level: Not on file  Occupational History  . Not on file  Social Needs  . Financial resource strain: Not hard at all  . Food insecurity:    Worry: Never true    Inability: Never true  . Transportation needs:    Medical: No    Non-medical: No  Tobacco Use  . Smoking status: Former Research scientist (life sciences)  .  Smokeless tobacco: Never Used  Substance and Sexual Activity  . Alcohol use: Not Currently  . Drug use: Never  . Sexual activity: Yes    Comment: husband   Lifestyle  . Physical activity:    Days per week: 0 days    Minutes per session: 0 min  . Stress: Not at all  Relationships  . Social connections:    Talks on phone: More than three times a week    Gets together: Once a week    Attends religious service: 1 to 4 times per year    Active member of club or organization: No    Attends meetings of clubs or organizations: Never    Relationship status: Married  . Intimate partner violence:    Fear of current or ex partner: No    Emotionally abused: No    Physically abused: No    Forced sexual activity: No  Other Topics Concern  . Not on file  Social History Narrative   Married    Husband lives in [place] while she gets treatment in this area    She lives alone in this area with family close by     FAMILY HISTORY: Family History  Problem Relation Age of Onset  . Breast cancer Mother 74  . Cancer Mother        breast   . Diabetes Mother   . Heart disease Mother   . Hyperlipidemia Mother   . Hypertension Mother   . Birth defects Brother   . Diabetes Brother   . Heart disease Brother   . Hyperlipidemia Brother   . Cancer Father        prostate met to liver     ALLERGIES:  is allergic to codeine.  MEDICATIONS:  Current Outpatient Medications  Medication Sig Dispense Refill  . acetaminophen (TYLENOL) 500 MG tablet Take 1,000 mg by mouth every 6 (six) hours as needed for mild pain or fever.    Marland Kitchen albuterol (VENTOLIN HFA) 108 (90 Base) MCG/ACT inhaler Inhale 2 puffs into the lungs every 6 (six) hours as needed. 1 Inhaler 1  . apixaban (ELIQUIS) 5 MG TABS tablet Take 1 tablet (5 mg total) by mouth 2 (two) times daily. 60 tablet 3  . Cholecalciferol 50000 units capsule Take 1 capsule (50,000 Units total) by mouth once a week. 13 capsule 1  . feeding supplement, ENSURE  ENLIVE, (ENSURE ENLIVE) LIQD Take 237 mLs by mouth 3 (three) times daily between meals. 237 mL 12  . fluticasone (FLONASE) 50 MCG/ACT nasal spray Place 1-2 sprays into both nostrils daily. (Patient taking differently: Place 1-2 sprays into both nostrils daily as needed. ) 16  g 2  . fluticasone (FLOVENT DISKUS) 50 MCG/BLIST diskus inhaler Inhale 1 puff into the lungs 2 (two) times daily. 1 Inhaler 3  . furosemide (LASIX) 20 MG tablet Take 1 tablet (20 mg total) by mouth daily as needed. 30 tablet 2  . loperamide (IMODIUM A-D) 2 MG tablet Take 2 mg by mouth 4 (four) times daily as needed for diarrhea or loose stools.    . montelukast (SINGULAIR) 10 MG tablet Take 10 mg by mouth at bedtime.    . ondansetron (ZOFRAN) 8 MG tablet Take 8 mg by mouth every 8 (eight) hours as needed for nausea.    Marland Kitchen oxyCODONE (OXY IR/ROXICODONE) 5 MG immediate release tablet Take 5 mg by mouth 3 (three) times daily as needed for moderate pain.    . pantoprazole (PROTONIX) 40 MG tablet Take 1 tablet by mouth every morning.    . pomalidomide (POMALYST) 3 MG capsule Take 1 capsule (3 mg total) by mouth daily. Take with water on days 1-21. Repeat every 28 days. 21 capsule 0  . potassium chloride (K-DUR) 10 MEQ tablet Take 1 tablet (10 mEq total) by mouth daily. 30 tablet 0  . pravastatin (PRAVACHOL) 10 MG tablet Take 1 tablet by mouth at bedtime.    . prochlorperazine (COMPAZINE) 10 MG tablet Take 10 mg by mouth every 6 (six) hours as needed for nausea.    Marland Kitchen oxyCODONE (OXYCONTIN) 10 mg 12 hr tablet Take 10 mg by mouth every 12 (twelve) hours as needed (severe pain).    . potassium chloride SA (K-DUR,KLOR-CON) 20 MEQ tablet Take 1 tablet (20 mEq total) by mouth daily. (Patient not taking: Reported on 05/04/2018) 14 tablet 0   No current facility-administered medications for this visit.    Facility-Administered Medications Ordered in Other Visits  Medication Dose Route Frequency Provider Last Rate Last Dose  . montelukast  (SINGULAIR) tablet 10 mg  10 mg Oral QHS Earlie Server, MD   10 mg at 04/20/18 1029     PHYSICAL EXAMINATION: ECOG PERFORMANCE STATUS: 2 - Symptomatic, <50% confined to bed Vitals:   05/04/18 0858  BP: 125/72  Pulse: (!) 159  Resp: 18  Temp: 97.9 F (36.6 C)   Filed Weights   05/04/18 0858  Weight: 137 lb 12.8 oz (62.5 kg)    Physical Exam  Constitutional: She is oriented to person, place, and time and well-developed, well-nourished, and in no distress. No distress.  HENT:  Head: Normocephalic and atraumatic.  Nose: Nose normal.  Mouth/Throat: Oropharynx is clear and moist. No oropharyngeal exudate.  Eyes: Pupils are equal, round, and reactive to light. EOM are normal. Left eye exhibits no discharge. No scleral icterus.  Neck: Normal range of motion. Neck supple. No JVD present. No tracheal deviation present.  Cardiovascular: Normal rate, regular rhythm and normal heart sounds.  No murmur heard. Pulmonary/Chest: Effort normal and breath sounds normal. No respiratory distress. She has no wheezes. She has no rales. She exhibits no tenderness.  Decreased breath sounds bilaterally.   Abdominal: Soft. She exhibits no distension and no mass. There is no tenderness. There is no rebound and no guarding.  Musculoskeletal: Normal range of motion. She exhibits edema. She exhibits no tenderness.  Bilateral 2+ edema.   Lymphadenopathy:    She has no cervical adenopathy.  Neurological: She is alert and oriented to person, place, and time. No cranial nerve deficit. She exhibits normal muscle tone. Coordination normal.  Skin: Skin is warm and dry. No rash noted. She is  not diaphoretic. No erythema. No pallor.  Psychiatric: Memory, affect and judgment normal.     LABORATORY DATA:  I have reviewed the data as listed Lab Results  Component Value Date   WBC 4.4 05/04/2018   HGB 12.5 05/04/2018   HCT 36.6 05/04/2018   MCV 88.5 05/04/2018   PLT 273 05/04/2018   Recent Labs    04/06/18 0823  04/20/18 0827 05/04/18 0824  NA 139 143 141  K 4.1 3.3* 4.5  CL 107 107 109  CO2 _0 GLUCOSE 110* 108* 105*  BUN _1 CREATININE 0.76 0.70 0.76  CALCIUM 9.5 9.1 10.1  GFRNONAA >60 >60 >60  GFRAA >60 >60 >60  PROT 6.5 6.3* 6.5  ALBUMIN 3.6 3.3* 3.6  AST _2 ALT _3 ALKPHOS 74 60 66  BILITOT 0.7 1.0 0.8       ASSESSMENT & PLAN:  1. Drug-induced neutropenia (HCC)   2. Multiple myeloma in relapse ([place])   3. Bilateral leg edema   4. Bone metastasis ([place])   5. Encounter for antineoplastic chemotherapy   6. Encounter for antineoplastic immunotherapy    # labs reviewed and are acceptable for Daratumumab treatment today.  # Advise patient to postphone start of Pomalyst so that she can get Deep clean procedure done this week and we can obtain dental clearance for starting Xgeva. She may hold Eliquis on the day of procedure and resume on the day after procedure.  She can restart Pomalyst in 1 week.   # Drug induced neutropenia, will repeat CBC, CMP in 2 weeks. Possible need of Granix if neutropenic.   #Bilateral lower extremity swelling, 2D echo showed normal LVEF. Advise patient to continue Spirolactone and start Lasix 48m daily with potassium supplementation.   All questions were answered. The patient knows to call the clinic with any problems questions or concerns. Return of visit:  2 weeks.    Total face to face encounter time for this patient visit was 25 min. >50% of the time was  spent in counseling and coordination of care.   ZEarlie Server MD, PhD Hematology Oncology [facility]at [address] [facility] Center LLCPager- [phone]/16/2019

## 2018-05-04 NOTE — Progress Notes (Signed)
Patient here for follow up. Noted with swelling to feet, no pain voiced.

## 2018-05-06 ENCOUNTER — Ambulatory Visit: Payer: Medicare PPO

## 2018-05-06 LAB — MULTIPLE MYELOMA PANEL, SERUM
ALBUMIN/GLOB SERPL: 1.2 (ref 0.7–1.7)
Albumin SerPl Elph-Mcnc: 3.1 g/dL (ref 2.9–4.4)
Alpha 1: 0.2 g/dL (ref 0.0–0.4)
Alpha2 Glob SerPl Elph-Mcnc: 0.9 g/dL (ref 0.4–1.0)
B-Globulin SerPl Elph-Mcnc: 1 g/dL (ref 0.7–1.3)
GAMMA GLOB SERPL ELPH-MCNC: 0.5 g/dL (ref 0.4–1.8)
GLOBULIN, TOTAL: 2.6 g/dL (ref 2.2–3.9)
IGA: 15 mg/dL — AB (ref 87–352)
IGM (IMMUNOGLOBULIN M), SRM: 14 mg/dL — AB (ref 26–217)
IgG (Immunoglobin G), Serum: 509 mg/dL — ABNORMAL LOW (ref 700–1600)
M Protein SerPl Elph-Mcnc: 0.2 g/dL — ABNORMAL HIGH
Total Protein ELP: 5.7 g/dL — ABNORMAL LOW (ref 6.0–8.5)

## 2018-05-17 ENCOUNTER — Other Ambulatory Visit: Payer: Self-pay | Admitting: *Deleted

## 2018-05-17 ENCOUNTER — Encounter: Payer: Self-pay | Admitting: Radiology

## 2018-05-17 ENCOUNTER — Ambulatory Visit
Admission: RE | Admit: 2018-05-17 | Discharge: 2018-05-17 | Disposition: A | Discharge status: Home / Self Care | Payer: Medicare PPO | Source: Ambulatory Visit | Attending: Internal Medicine | Admitting: Internal Medicine

## 2018-05-17 DIAGNOSIS — Z1231 Encounter for screening mammogram for malignant neoplasm of breast: Secondary | ICD-10-CM | POA: Diagnosis present

## 2018-05-18 ENCOUNTER — Inpatient Hospital Stay: Payer: Medicare PPO

## 2018-05-18 ENCOUNTER — Other Ambulatory Visit: Payer: Self-pay

## 2018-05-18 ENCOUNTER — Inpatient Hospital Stay (HOSPITAL_BASED_OUTPATIENT_CLINIC_OR_DEPARTMENT_OTHER): Payer: Medicare PPO | Admitting: Oncology

## 2018-05-18 ENCOUNTER — Encounter: Payer: Self-pay | Admitting: Oncology

## 2018-05-18 VITALS — BP 115/72 | HR 60 | Temp 97.5°F | Resp 18 | Wt 139.0 lb

## 2018-05-18 DIAGNOSIS — Z5112 Encounter for antineoplastic immunotherapy: Secondary | ICD-10-CM | POA: Diagnosis not present

## 2018-05-18 DIAGNOSIS — C7951 Secondary malignant neoplasm of bone: Secondary | ICD-10-CM

## 2018-05-18 DIAGNOSIS — G620 Drug-induced polyneuropathy: Secondary | ICD-10-CM

## 2018-05-18 DIAGNOSIS — T451X5S Adverse effect of antineoplastic and immunosuppressive drugs, sequela: Secondary | ICD-10-CM | POA: Diagnosis not present

## 2018-05-18 DIAGNOSIS — R5383 Other fatigue: Secondary | ICD-10-CM

## 2018-05-18 DIAGNOSIS — Z87891 Personal history of nicotine dependence: Secondary | ICD-10-CM

## 2018-05-18 DIAGNOSIS — Z5111 Encounter for antineoplastic chemotherapy: Secondary | ICD-10-CM

## 2018-05-18 DIAGNOSIS — K219 Gastro-esophageal reflux disease without esophagitis: Secondary | ICD-10-CM

## 2018-05-18 DIAGNOSIS — C9002 Multiple myeloma in relapse: Secondary | ICD-10-CM

## 2018-05-18 DIAGNOSIS — M7989 Other specified soft tissue disorders: Secondary | ICD-10-CM

## 2018-05-18 DIAGNOSIS — D702 Other drug-induced agranulocytosis: Secondary | ICD-10-CM

## 2018-05-18 DIAGNOSIS — Z86718 Personal history of other venous thrombosis and embolism: Secondary | ICD-10-CM

## 2018-05-18 DIAGNOSIS — Z7901 Long term (current) use of anticoagulants: Secondary | ICD-10-CM

## 2018-05-18 DIAGNOSIS — Z87440 Personal history of urinary (tract) infections: Secondary | ICD-10-CM

## 2018-05-18 DIAGNOSIS — I82721 Chronic embolism and thrombosis of deep veins of right upper extremity: Secondary | ICD-10-CM

## 2018-05-18 DIAGNOSIS — R600 Localized edema: Secondary | ICD-10-CM

## 2018-05-18 DIAGNOSIS — M549 Dorsalgia, unspecified: Secondary | ICD-10-CM

## 2018-05-18 DIAGNOSIS — E785 Hyperlipidemia, unspecified: Secondary | ICD-10-CM

## 2018-05-18 DIAGNOSIS — I10 Essential (primary) hypertension: Secondary | ICD-10-CM

## 2018-05-18 DIAGNOSIS — J449 Chronic obstructive pulmonary disease, unspecified: Secondary | ICD-10-CM

## 2018-05-18 DIAGNOSIS — Z803 Family history of malignant neoplasm of breast: Secondary | ICD-10-CM

## 2018-05-18 DIAGNOSIS — Z8042 Family history of malignant neoplasm of prostate: Secondary | ICD-10-CM

## 2018-05-18 DIAGNOSIS — Z79899 Other long term (current) drug therapy: Secondary | ICD-10-CM

## 2018-05-18 DIAGNOSIS — R5381 Other malaise: Secondary | ICD-10-CM

## 2018-05-18 LAB — COMPREHENSIVE METABOLIC PANEL
ALK PHOS: 57 U/L (ref 38–126)
ALT: 15 U/L (ref 0–44)
AST: 16 U/L (ref 15–41)
Albumin: 3.4 g/dL — ABNORMAL LOW (ref 3.5–5.0)
Anion gap: 9 (ref 5–15)
BILIRUBIN TOTAL: 0.8 mg/dL (ref 0.3–1.2)
BUN: 12 mg/dL (ref 8–23)
CALCIUM: 9.3 mg/dL (ref 8.9–10.3)
CHLORIDE: 106 mmol/L (ref 98–111)
CO2: 25 mmol/L (ref 22–32)
CREATININE: 0.72 mg/dL (ref 0.44–1.00)
GFR calc Af Amer: >60 mL/min (ref >60)
GFR calc non Af Amer: >60 mL/min (ref >60)
Glucose, Bld: 103 mg/dL — ABNORMAL HIGH (ref 70–99)
Potassium: 3.5 mmol/L (ref 3.5–5.1)
Sodium: 140 mmol/L (ref 135–145)
Total Protein: 6.3 g/dL — ABNORMAL LOW (ref 6.5–8.1)

## 2018-05-18 LAB — CBC WITH DIFFERENTIAL/PLATELET
Basophils Absolute: 0.1 10*3/uL (ref 0–0.1)
Basophils Relative: 1 %
Eosinophils Absolute: 0.2 10*3/uL (ref 0–0.7)
Eosinophils Relative: 5 %
HEMATOCRIT: 36.3 % (ref 35.0–47.0)
Hemoglobin: 12.2 g/dL (ref 12.0–16.0)
LYMPHS ABS: 0.7 10*3/uL — AB (ref 1.0–3.6)
Lymphocytes Relative: 13 %
MCH: 29.8 pg (ref 26.0–34.0)
MCHC: 33.7 g/dL (ref 32.0–36.0)
MCV: 88.4 fL (ref 80.0–100.0)
MONO ABS: 0.5 10*3/uL (ref 0.2–0.9)
MONOS PCT: 10 %
NEUTROS ABS: 3.5 10*3/uL (ref 1.4–6.5)
Neutrophils Relative %: 71 %
Platelets: 211 10*3/uL (ref 150–440)
RBC: 4.1 MIL/uL (ref 3.80–5.20)
RDW: 14.6 % — AB (ref 11.5–14.5)
WBC: 5 10*3/uL (ref 3.6–11.0)

## 2018-05-18 MED ORDER — CALCIUM CARBONATE-VITAMIN D 500-200 MG-UNIT PO TABS: 1.0000 | ORAL_TABLET | Freq: Every day | ORAL | 0 refills | Status: AC

## 2018-05-18 MED ORDER — DIPHENHYDRAMINE HCL 25 MG PO CAPS
50.0000 mg | ORAL_CAPSULE | Freq: Once | ORAL | Status: AC
  Administered 2018-05-18: 50 mg via ORAL
  Filled 2018-05-18: qty 2

## 2018-05-18 MED ORDER — SODIUM CHLORIDE 0.9 % IV SOLN
900.0000 mg | Freq: Once | INTRAVENOUS | Status: AC
  Administered 2018-05-18: 900 mg via INTRAVENOUS
  Filled 2018-05-18: qty 40

## 2018-05-18 MED ORDER — DENOSUMAB 120 MG/1.7ML ~~LOC~~ SOLN
120.0000 mg | Freq: Once | SUBCUTANEOUS | Status: AC
  Administered 2018-05-18: 120 mg via SUBCUTANEOUS
  Filled 2018-05-18: qty 1.7

## 2018-05-18 MED ORDER — ACETAMINOPHEN 325 MG PO TABS
650.0000 mg | ORAL_TABLET | Freq: Once | ORAL | Status: AC
Start: 2018-05-18 — End: 2018-05-18
  Administered 2018-05-18: 650 mg via ORAL
  Filled 2018-05-18: qty 2

## 2018-05-18 MED ORDER — POTASSIUM CHLORIDE CRYS ER 20 MEQ PO TBCR: 20.0000 meq | EXTENDED_RELEASE_TABLET | Freq: Every day | ORAL | 2 refills | Status: DC

## 2018-05-18 MED ORDER — MONTELUKAST SODIUM 10 MG PO TABS
10.0000 mg | ORAL_TABLET | Freq: Once | ORAL | Status: AC
  Administered 2018-05-18: 10 mg via ORAL
  Filled 2018-05-18: qty 1

## 2018-05-18 MED ORDER — HEPARIN SOD (PORK) LOCK FLUSH 100 UNIT/ML IV SOLN
500.0000 [IU] | Freq: Once | INTRAVENOUS | Status: DC | PRN
  Filled 2018-05-18: qty 5

## 2018-05-18 MED ORDER — PROCHLORPERAZINE MALEATE 10 MG PO TABS
10.0000 mg | ORAL_TABLET | Freq: Once | ORAL | Status: AC
  Administered 2018-05-18: 10 mg via ORAL
  Filled 2018-05-18: qty 1

## 2018-05-18 MED ORDER — SODIUM CHLORIDE 0.9 % IV SOLN
Freq: Once | INTRAVENOUS | Status: AC
  Administered 2018-05-18: 10:00:00 via INTRAVENOUS
  Filled 2018-05-18: qty 1000

## 2018-05-18 MED ORDER — SODIUM CHLORIDE 0.9 % IV SOLN
20.0000 mg | Freq: Once | INTRAVENOUS | Status: AC
  Administered 2018-05-18: 20 mg via INTRAVENOUS
  Filled 2018-05-18: qty 2

## 2018-05-18 NOTE — Progress Notes (Signed)
Hematology/Oncology Follow up note [facility] [phone] [phone]   Patient Care Team: McLean-Scocuzza, Nino Glow, MD as PCP - General (Internal Medicine)  REFERRING PROVIDER: Dr. Baruch Gouty, Donette  REASON FOR VISIT Follow up for treatment of multiple myeloma.   HISTORY OF PRESENTING ILLNESS:  Cassie Jones is a  72 y.o.  female with PMH listed below who was referred to me for evaluation of multiple myeloma.  Patient used to follow-up with local oncologist Dr. Tonia Brooms and [facility] oncologist Dr. Amalia Hailey.  Patient tells me that as her husband works every day and has a busy working schedule, she is not able to get transportation to her treatment.  As an alternative she is now living with her brother who is close to [facility] [place] and want to transfer care here. Extensive medical record review was performed.    Patient was diagnosed with IgG lambda, ISS2, t (4,14) multiple myeloma on February 03, 2017, she has hemoglobin of 9, calcium 8.9, LDH 132, beta microglobulin 4.6, albumin 3.2.  Skeletal survey negative.  PET scan with single nonspecific humeral lesion and a thyroid nodule.  Marrow with 60% plasmacytosis by CD138 IHC, FISH with hyperdiploidy t(4, 14), 1 q. gain.. Per note, she was treated with CyborD (M spike 3.6 at start)  from 01/2017 to 02/2017. Her treatment was switched to Revlimid Velcade dexamethasone from 02/2017, M spike 1.2 at start,and was found to have progression of disease in 09/2017, when her M spike increased to 1.8, and developed compression fracture in 10/2016. She was restarted on RVD using weekly Velcade.  She was recently seen by Dr. Boris Sharper and recommended to start on a combination of daratumumab, Pomalyst, dexamethasone. Per patient she received her first daratumumab last week with split dose 40m/kg given on 3/14 and 3/15.  Patient reports that she tolerate the treatment well except mild infusion  reaction.  # Patient also reports that she has got dental clearance and has been given 1 dose of Zometa by Dr. VRemus Loffler(unkown dates).  [facility]  # Patient was seen by nurse practitioner last week prior to her cycle 2 daratumumab.  She reports feeling shortness of breath, having cough, believed to have bronchitis and was prescribed Levaquin 500 mg p.o. daily for 7 days, and albuterol as needed.  She also had a VQ scan low probability for pulmonary embolism.  # 01/29/2018 5th weekly treatment of Daratumumab,  Cycle 2 Pomalyst was started for a week and was nterrupted due to acute bronchitis treated with outpatient Azithromycin.    # 02/16/2018 developed right upper extremity swelling and tenderness, confirmed to be DVT. She was taking Aspirin.  Started on Eliquis. Aspirin is stopped.  # 02/16/2018 6th Daratumumab treatment. Was started on Cycle 3 Pomalyst, she has completed about 2 weeks treatment. She was admitted to hospital on 02/21/2018 for Klebsiella UTI and oncology was not consulted. She was treatment with antibiotics and continued on Pomalyst. She was hospitalized again on 03/02/2018 for pneumonia, COPD exacerbation/emphasema and oncology was consulted and patient was seen by my colleague Dr.Finnergan and advise patient to stop Pomalyst.   # [facility] recommends dose reduced Pomalyst 391m This was previously discussed with patient, and she was reluctant to be restarted on Pomalyst. Today she seems to be more acceptable for restarting Pomalyst, but requests to start in a few weeks so that she can  have a nice break.   #Previously declined bone marrow transplant evaluation.    INTERVAL HISTORY Cassie Jones is a 72 y.o. female who has above history reviewed by me today presents for assessment prior to antineoplastic immunotherapy and to follow-up for chemotherapy toxicity  assessment.  reviewed by me today presents for assessment prior chemotherapy and anti neoplasm immunotherapy for multiple myeloma.   #Patient reports feeling well at baseline.  Denies any fever or chills denies any dysuria symptoms.  She is currently day 7 of cycle 5 Pomalyst 3 mg day 1-21.  Pomalyst was delayed a week so that patient can  finish dental cleaning and she has finally obtained dental clearance of Xgeva.  #Continue to have bilateral lower extremity swelling.  she has had 2D echo done which showed LVEF of 55 to 60%. She takes spironolactone.  Lasix was also added.  Patient reports that she has not taken her Lasix lately.  #Back pain, reports back pain is stable.  Occasionally she will feel "back pressure".   #Fatigue: Stable #Shortness of breath: Chronic and stable.  Data reviewed:  Multiple myeloma labs obtained at last visit:  03/06/2018 SPEP showed VGPR with >90% reduction of her M protein. Normalized free light chain ratio. 05/04/2018 SPEP continued to show stable M protein, at 0.2g/L,   Current Treatment  # 3/14, 3/15 at outside facility, she got split dose for first treatment of daratumumab. #  01/08/2018 Start on weekly daratumumab 72m/m2, dexamethasone 227m  S/p # Palliative RT to spine. 01/08/2018 started cycle 1 Pomalyst 60m67may 1-21 ogf 28 day cycle.  Pomalyst 4 mg was held after 3 cycles with multiple interruptions due to neutropenia and recurrent pneumonia.  Pomalyst was restarted at a lower dose of 3 mg on 04/06/2018 [cycle 4] . This cycle of Pomalyst was  disrreupted for 4 days due to UTI. She was instructed to restart and was able to finish this cycle.   Cycle 5 Pomalyst delayed one week as she needs to get deep dental clean before obtaining dental clearance for Xgeva.   Current Pain medication: Oxycodone 5mg42mh as needed. Oxycontin 10mg73mh.   Review of Systems  Constitutional: Positive for malaise/fatigue. Negative for chills, fever and weight loss.   HENT: Negative for congestion, ear discharge, ear pain, hearing loss, nosebleeds, sinus pain and sore throat.   Eyes: Negative for blurred vision, double vision, photophobia, pain, discharge and redness.  Respiratory: Negative for cough, hemoptysis, sputum production, shortness of breath and wheezing.   Cardiovascular: Negative for chest pain, palpitations, orthopnea, claudication and leg swelling.  Gastrointestinal: Negative for abdominal pain, blood in stool, constipation, diarrhea, heartburn, melena, nausea and vomiting.  Genitourinary: Negative for dysuria, flank pain, frequency, hematuria and urgency.  Musculoskeletal: Negative for back pain, joint pain, myalgias and neck pain.       Leg swelling   Skin: Negative for itching and rash.  Neurological: Negative for dizziness, tingling, tremors, sensory change, speech change, focal weakness, weakness and headaches.  Endo/Heme/Allergies: Negative for environmental allergies. Does not bruise/bleed easily.  Psychiatric/Behavioral: Negative for depression, hallucinations, substance abuse and suicidal ideas. The patient is not nervous/anxious.     MEDICAL HISTORY:  Past Medical History:  Diagnosis Date  . Ascites   . Asthma   . Bilateral leg edema   . Cancer (HCC) [place] Chicken pox   . Colon polyps   . Diverticulitis    with perforation 02/2017 and hosp with colostomy bag s/p removal   .  Drug-induced neutropenia ([place]) 03/09/2018  . GERD (gastroesophageal reflux disease)   . Hyperlipidemia   . Hypertension   . Multiple myeloma ([place])    dx'ed 01/2018 follows Dr. Tasia Catchings and [facility] H/O   . Pleural effusion   . Pneumonia    03/02/18   . Thyroid nodule   . UTI (urinary tract infection)   . Vitamin D deficiency     SURGICAL HISTORY: Past Surgical History:  Procedure Laterality Date  . ABDOMINAL HYSTERECTOMY    . BREAST BIOPSY Bilateral yrs ago   benign  . CHOLECYSTECTOMY    . COLOSTOMY  2018  . COLOSTOMY REVERSAL     11 2018  . KYPHOPLASTY      11/2017 [place]     SOCIAL HISTORY: Social History   Socioeconomic History  . Marital status: Married    Spouse name: Not on file  . Number of children: Not on file  . Years of education: Not on file  . Highest education level: Not on file  Occupational History  . Not on file  Social Needs  . Financial resource strain: Not hard at all  . Food insecurity:    Worry: Never true    Inability: Never true  . Transportation needs:    Medical: No    Non-medical: No  Tobacco Use  . Smoking status: Former Research scientist (life sciences)  . Smokeless tobacco: Never Used  Substance and Sexual Activity  . Alcohol use: Not Currently  . Drug use: Never  . Sexual activity: Yes    Comment: husband   Lifestyle  . Physical activity:    Days per week: 0 days    Minutes per session: 0 min  . Stress: Not at all  Relationships  . Social connections:    Talks on phone: More than three times a week    Gets together: Once a week    Attends religious service: 1 to 4 times per year    Active member of club or organization: No    Attends meetings of clubs or organizations: Never    Relationship status: Married  . Intimate partner violence:    Fear of current or ex partner: No    Emotionally abused: No    Physically abused: No    Forced sexual activity: No  Other Topics Concern  . Not on file  Social History Narrative   Married    Husband lives in [place] while she gets treatment in this area    She lives alone in this area with family close by     FAMILY HISTORY: Family History  Problem Relation Age of Onset  . Breast cancer Mother 57  . Cancer Mother        breast   . Diabetes Mother   . Heart disease Mother   . Hyperlipidemia Mother   . Hypertension Mother   . Birth defects Brother   . Diabetes Brother   . Heart disease Brother   . Hyperlipidemia Brother   . Cancer Father        prostate met to liver     ALLERGIES:  is allergic to codeine.  MEDICATIONS:  Current Outpatient  Medications  Medication Sig Dispense Refill  . acetaminophen (TYLENOL) 500 MG tablet Take 1,000 mg by mouth every 6 (six) hours as needed for mild pain or fever.    Marland Kitchen albuterol (VENTOLIN HFA) 108 (90 Base) MCG/ACT inhaler Inhale 2 puffs into the lungs every 6 (six) hours as needed. 1 Inhaler 1  .  apixaban (ELIQUIS) 5 MG TABS tablet Take 1 tablet (5 mg total) by mouth 2 (two) times daily. 60 tablet 3  . Cholecalciferol 50000 units capsule Take 1 capsule (50,000 Units total) by mouth once a week. 13 capsule 1  . feeding supplement, ENSURE ENLIVE, (ENSURE ENLIVE) LIQD Take 237 mLs by mouth 3 (three) times daily between meals. 237 mL 12  . fluticasone (FLONASE) 50 MCG/ACT nasal spray Place 1-2 sprays into both nostrils daily. (Patient taking differently: Place 1-2 sprays into both nostrils daily as needed. ) 16 g 2  . fluticasone (FLOVENT DISKUS) 50 MCG/BLIST diskus inhaler Inhale 1 puff into the lungs 2 (two) times daily. (Patient taking differently: Inhale 1 puff into the lungs 2 (two) times daily as needed. ) 1 Inhaler 3  . furosemide (LASIX) 20 MG tablet Take 1 tablet (20 mg total) by mouth daily as needed. 30 tablet 2  . loperamide (IMODIUM A-D) 2 MG tablet Take 2 mg by mouth 4 (four) times daily as needed for diarrhea or loose stools.    . montelukast (SINGULAIR) 10 MG tablet Take 10 mg by mouth at bedtime.    . ondansetron (ZOFRAN) 8 MG tablet Take 8 mg by mouth every 8 (eight) hours as needed for nausea.    Marland Kitchen oxyCODONE (OXY IR/ROXICODONE) 5 MG immediate release tablet Take 5 mg by mouth 3 (three) times daily as needed for moderate pain.    Marland Kitchen oxyCODONE (OXYCONTIN) 10 mg 12 hr tablet Take 10 mg by mouth every 12 (twelve) hours as needed (severe pain).    . pantoprazole (PROTONIX) 40 MG tablet Take 1 tablet by mouth every morning.    . pomalidomide (POMALYST) 3 MG capsule Take 1 capsule (3 mg total) by mouth daily. Take with water on days 1-21. Repeat every 28 days. 21 capsule 0  . pravastatin  (PRAVACHOL) 10 MG tablet Take 1 tablet by mouth at bedtime.    . prochlorperazine (COMPAZINE) 10 MG tablet Take 10 mg by mouth every 6 (six) hours as needed for nausea.    . calcium-vitamin D (OSCAL WITH D) 500-200 MG-UNIT tablet Take 1 tablet by mouth daily. 30 tablet 0  . potassium chloride SA (K-DUR,KLOR-CON) 20 MEQ tablet Take 1 tablet (20 mEq total) by mouth daily. 30 tablet 2   No current facility-administered medications for this visit.    Facility-Administered Medications Ordered in Other Visits  Medication Dose Route Frequency Provider Last Rate Last Dose  . heparin lock flush 100 unit/mL  500 Units Intracatheter Once PRN Earlie Server, MD      . montelukast (SINGULAIR) tablet 10 mg  10 mg Oral Anthoney Harada, MD   10 mg at 04/20/18 1029     PHYSICAL EXAMINATION: ECOG PERFORMANCE STATUS: 1 - Symptomatic but completely ambulatory Vitals:   05/18/18 0847  BP: 115/72  Pulse: 60  Resp: 18  Temp: (!) 97.5 F (36.4 C)  SpO2: 97%   Filed Weights   05/18/18 0847  Weight: 139 lb (63 kg)    Physical Exam  Constitutional: She is oriented to person, place, and time and well-developed, well-nourished, and in no distress. No distress.  HENT:  Head: Normocephalic and atraumatic.  Nose: Nose normal.  Mouth/Throat: Oropharynx is clear and moist. No oropharyngeal exudate.  Eyes: Pupils are equal, round, and reactive to light. EOM are normal. Left eye exhibits no discharge. No scleral icterus.  Neck: Normal range of motion. Neck supple. No JVD present. No tracheal deviation present.  Cardiovascular: Normal rate,  regular rhythm and normal heart sounds.  No murmur heard. Pulmonary/Chest: Effort normal and breath sounds normal. No respiratory distress. She has no wheezes. She has no rales. She exhibits no tenderness.  Decreased breath sounds bilaterally.   Abdominal: Soft. She exhibits no distension and no mass. There is no tenderness. There is no rebound and no guarding.  Musculoskeletal:  Normal range of motion. She exhibits edema. She exhibits no tenderness.  Bilateral 2+ edema.   Lymphadenopathy:    She has no cervical adenopathy.  Neurological: She is alert and oriented to person, place, and time. No cranial nerve deficit. She exhibits normal muscle tone. Coordination normal.  Skin: Skin is warm and dry. No rash noted. She is not diaphoretic. No erythema. No pallor.  Psychiatric: Memory, affect and judgment normal.     LABORATORY DATA:  I have reviewed the data as listed Lab Results  Component Value Date   WBC 5.0 05/18/2018   HGB 12.2 05/18/2018   HCT 36.3 05/18/2018   MCV 88.4 05/18/2018   PLT 211 05/18/2018   Recent Labs    04/20/18 0827 05/04/18 0824 05/18/18 0825  NA 143 141 140  K 3.3* 4.5 3.5  CL 107 109 106  CO2 _0 GLUCOSE 108* 105* 103*  BUN _1 CREATININE 0.70 0.76 0.72  CALCIUM 9.1 10.1 9.3  GFRNONAA >60 >60 >60  GFRAA >60 >60 >60  PROT 6.3* 6.5 6.3*  ALBUMIN 3.3* 3.6 3.4*  AST _2 ALT _3 ALKPHOS 60 66 57  BILITOT 1.0 0.8 0.8       ASSESSMENT & PLAN:  1. Multiple myeloma in relapse ([place])   2. Bilateral leg edema   3. Bone metastasis ([place])   4. Encounter for antineoplastic immunotherapy   5. Encounter for antineoplastic chemotherapy   6. History of DVT (deep vein thrombosis)    #Labs reviewed and are acceptable.  Proceed with daratumumab treatment today. ANC 3.5, after being on Pomalyst for 1 week.  Continue Pomalyst at 3 mg daily. Multiple myeloma labs reviewed. M protein increased from 0.1 to 0.2. Continue close monitoring.  She has follow up visit with  [facility] and will have lab work done.   #Bone metastasis, we have got dental clearance.  Rationale of Xgeva and potential side effects including but not limited to electrolyte abnormality, osteo jaw necrosis, etc were discussed with patient in details.  She voices understanding and willing to proceed with treatment.  She is going to get [place]  today.  Advised patient to take calcium with vitamin D supplements daily. Marland Kitchen  #Bilateral lower extremity swelling, 2D echo showed normal LVEF.  Advised patient to continue spironolactone and start Lasix 20 mg daily with potassium supplementation.   #Chronic DVT, continue Eliquis   All questions were answered. The patient knows to call the clinic with any problems questions or concerns.  Return of visit: 2 weeks.       Earlie Server, MD, PhD Hematology Oncology [facility] Pager- [phone] 05/18/2018

## 2018-05-18 NOTE — Progress Notes (Signed)
Patient here for follow up. No concerns voiced today.  

## 2018-05-28 ENCOUNTER — Other Ambulatory Visit: Payer: Self-pay | Admitting: *Deleted

## 2018-05-28 ENCOUNTER — Inpatient Hospital Stay
Admission: RE | Admit: 2018-05-28 | Discharge: 2018-05-28 | Disposition: A | Discharge status: Home / Self Care | Payer: Self-pay | Source: Ambulatory Visit | Attending: *Deleted | Admitting: *Deleted

## 2018-05-28 DIAGNOSIS — Z9289 Personal history of other medical treatment: Secondary | ICD-10-CM

## 2018-05-30 NOTE — Progress Notes (Signed)
[facility] Consultation      Assessment and Plan:  COPD, group B with recent exacerbation but minimal daily symptoms.  --will start spiriva inhaler once daily.  --Will check complete PFT.    Multiple Myeloma. Right upper lobe extremity DVT, currently on anticoagulation. --Currently follows with oncology.   Orders Placed This Encounter  Procedures  . Pulmonary Function Test [facility] Only   Meds ordered this encounter  Medications  . tiotropium (SPIRIVA) 18 MCG inhalation capsule    Sig: Place 1 capsule (18 mcg total) into inhaler and inhale daily.    Dispense:  30 capsule    Refill:  6   Return in about 3 months (around 08/31/2018).    Date: 05/31/2018  MRN# [id number] Cassie Jones 1946-01-26    Cassie Jones is a 72 y.o. old female seen in consultation for chief complaint of:    Chief Complaint  Patient presents with  . Consult    ref by [facility]:  . COPD    SOB w/activity: NP cough:     HPI:   The patient is a 71 yo female with a history of MM for which she follows with oncology.  She was also found to have a RUE DVT on 02/16/2018. She was admitted to the hospital from 5/14-5/17/19 for pneumonia. She last smoked several years ago. She has been told that she has COPD. She does not currently take any inhalers, she has taken inhalers of flovent as well as ventolin, the last time was in June of this year. She has not used them since that time.  She thinks that her breathing is "pretty good" currently. She can drive a car and goes to [employer] without getting winded. She lives in an apt so does not work outside.    Chest X ray 03/02/18; images personally reviewed; bilat. Interstitial changes, LLL infiltrate.    PMHX:   Past Medical History:  Diagnosis Date  . Ascites   . Asthma   . Bilateral leg edema   . Cancer ([place])   . Chicken pox   . Colon polyps   . Diverticulitis    with perforation 02/2017 and hosp with colostomy bag s/p  removal   . Drug-induced neutropenia ([place]) 03/09/2018  . GERD (gastroesophageal reflux disease)   . Hyperlipidemia   . Hypertension   . Multiple myeloma ([place])    dx'ed 01/2018 follows Dr. Tasia Catchings and [facility] H/O   . Pleural effusion   . Pneumonia    03/02/18   . Thyroid nodule   . UTI (urinary tract infection)   . Vitamin D deficiency    Surgical Hx:  Past Surgical History:  Procedure Laterality Date  . ABDOMINAL HYSTERECTOMY    . BREAST BIOPSY Bilateral yrs ago   benign  . CHOLECYSTECTOMY    . COLOSTOMY  2018  . COLOSTOMY REVERSAL     11 2018  . KYPHOPLASTY     11/2017 [place]    Family Hx:  Family History  Problem Relation Age of Onset  . Breast cancer Mother 72  . Cancer Mother        breast   . Diabetes Mother   . Heart disease Mother   . Hyperlipidemia Mother   . Hypertension Mother   . Birth defects Brother   . Diabetes Brother   . Heart disease Brother   . Hyperlipidemia Brother   . Cancer Father        prostate met to liver  Social Hx:   Social History   Tobacco Use  . Smoking status: Former Research scientist (life sciences)  . Smokeless tobacco: Never Used  Substance Use Topics  . Alcohol use: Not Currently  . Drug use: Never   Medication:    Current Outpatient Medications:  .  acetaminophen (TYLENOL) 500 MG tablet, Take 1,000 mg by mouth every 6 (six) hours as needed for mild pain or fever., Disp: , Rfl:  .  albuterol (VENTOLIN HFA) 108 (90 Base) MCG/ACT inhaler, Inhale 2 puffs into the lungs every 6 (six) hours as needed., Disp: 1 Inhaler, Rfl: 1 .  apixaban (ELIQUIS) 5 MG TABS tablet, Take 1 tablet (5 mg total) by mouth 2 (two) times daily., Disp: 60 tablet, Rfl: 3 .  calcium-vitamin D (OSCAL WITH D) 500-200 MG-UNIT tablet, Take 1 tablet by mouth daily., Disp: 30 tablet, Rfl: 0 .  Cholecalciferol 50000 units capsule, Take 1 capsule (50,000 Units total) by mouth once a week., Disp: 13 capsule, Rfl: 1 .  feeding supplement, ENSURE ENLIVE, (ENSURE ENLIVE) LIQD, Take 237 mLs by  mouth 3 (three) times daily between meals., Disp: 237 mL, Rfl: 12 .  fluticasone (FLONASE) 50 MCG/ACT nasal spray, Place 1-2 sprays into both nostrils daily. (Patient taking differently: Place 1-2 sprays into both nostrils daily as needed. ), Disp: 16 g, Rfl: 2 .  fluticasone (FLOVENT DISKUS) 50 MCG/BLIST diskus inhaler, Inhale 1 puff into the lungs 2 (two) times daily. (Patient taking differently: Inhale 1 puff into the lungs 2 (two) times daily as needed. ), Disp: 1 Inhaler, Rfl: 3 .  furosemide (LASIX) 20 MG tablet, Take 1 tablet (20 mg total) by mouth daily as needed., Disp: 30 tablet, Rfl: 2 .  loperamide (IMODIUM A-D) 2 MG tablet, Take 2 mg by mouth 4 (four) times daily as needed for diarrhea or loose stools., Disp: , Rfl:  .  montelukast (SINGULAIR) 10 MG tablet, Take 10 mg by mouth at bedtime., Disp: , Rfl:  .  ondansetron (ZOFRAN) 8 MG tablet, Take 8 mg by mouth every 8 (eight) hours as needed for nausea., Disp: , Rfl:  .  oxyCODONE (OXY IR/ROXICODONE) 5 MG immediate release tablet, Take 5 mg by mouth 3 (three) times daily as needed for moderate pain., Disp: , Rfl:  .  oxyCODONE (OXYCONTIN) 10 mg 12 hr tablet, Take 10 mg by mouth every 12 (twelve) hours as needed (severe pain)., Disp: , Rfl:  .  pantoprazole (PROTONIX) 40 MG tablet, Take 1 tablet by mouth every morning., Disp: , Rfl:  .  pomalidomide (POMALYST) 3 MG capsule, Take 1 capsule (3 mg total) by mouth daily. Take with water on days 1-21. Repeat every 28 days., Disp: 21 capsule, Rfl: 0 .  potassium chloride SA (K-DUR,KLOR-CON) 20 MEQ tablet, Take 1 tablet (20 mEq total) by mouth daily., Disp: 30 tablet, Rfl: 2 .  pravastatin (PRAVACHOL) 10 MG tablet, Take 1 tablet by mouth at bedtime., Disp: , Rfl:  .  prochlorperazine (COMPAZINE) 10 MG tablet, Take 10 mg by mouth every 6 (six) hours as needed for nausea., Disp: , Rfl:  No current facility-administered medications for this visit.   Facility-Administered Medications Ordered in Other  Visits:  .  montelukast (SINGULAIR) tablet 10 mg, 10 mg, Oral, Q[facility], Earlie Server, MD, 10 mg at 04/20/18 1029   Allergies:  Codeine  Review of Systems: Gen:  Denies  fever, sweats, chills HEENT: Denies blurred vision, double vision. bleeds, sore throat Cvc:  No dizziness, chest pain. Resp:   Denies  cough or sputum production, shortness of breath Gi: Denies swallowing difficulty, stomach pain. Gu:  Denies bladder incontinence, burning urine Ext:   No Joint pain, stiffness. Skin: No skin rash,  hives  Endoc:  No polyuria, polydipsia. Psych: No depression, insomnia. Other:  All other systems were reviewed with the patient and were negative other that what is mentioned in the HPI.   Physical Examination:   VS: BP 138/70 (BP Location: Left Arm, Cuff Size: Normal)   Pulse 62   Ht _0  (1.575 m)   Wt 139 lb (63 kg)   SpO2 98%   BMI 25.42 kg/m   General Appearance: No distress  Neuro:without focal findings,  speech normal,  HEENT: PERRLA, EOM intact.   Pulmonary: normal breath sounds, No wheezing.  CardiovascularNormal S1,S2.  No m/r/g.   Abdomen: Benign, Soft, non-tender. Renal:  No costovertebral tenderness  GU:  No performed at this time. Endoc: No evident thyromegaly, no signs of acromegaly. Skin:   warm, no rashes, no ecchymosis  Extremities: normal, no cyanosis, clubbing.  Other findings:    LABORATORY PANEL:   CBC No results for input(s): WBC, HGB, HCT, PLT in the last 168 hours. ------------------------------------------------------------------------------------------------------------------  Chemistries  No results for input(s): NA, K, CL, CO2, GLUCOSE, BUN, CREATININE, CALCIUM, MG, AST, ALT, ALKPHOS, BILITOT in the last 168 hours.  Invalid input(s): GFRCGP ------------------------------------------------------------------------------------------------------------------  Cardiac Enzymes No results for input(s): TROPONINI in the last 168  hours. ------------------------------------------------------------  RADIOLOGY:  No results found.     Thank  you for the consultation and for allowing [facility] to assist in the care of your patient. Our recommendations are noted above.  Please contact us if we can be of further service.   Marda Stalker, M.D., F.C.C.P.  Board Certified in Internal Medicine, Pulmonary Medicine, [place], and Sleep Medicine.  [facility] Office Number: [phone]   05/31/2018

## 2018-05-31 ENCOUNTER — Ambulatory Visit (INDEPENDENT_AMBULATORY_CARE_PROVIDER_SITE_OTHER): Payer: Medicare PPO | Admitting: Internal Medicine

## 2018-05-31 ENCOUNTER — Encounter: Payer: Self-pay | Admitting: Internal Medicine

## 2018-05-31 VITALS — BP 138/70 | HR 62 | Ht 62.0 in | Wt 139.0 lb

## 2018-05-31 DIAGNOSIS — J439 Emphysema, unspecified: Secondary | ICD-10-CM | POA: Diagnosis not present

## 2018-05-31 MED ORDER — TIOTROPIUM BROMIDE MONOHYDRATE 18 MCG IN CAPS: 18.0000 ug | ORAL_CAPSULE | Freq: Every day | RESPIRATORY_TRACT | 6 refills | Status: AC

## 2018-05-31 NOTE — Patient Instructions (Addendum)
Will start spiriva once daily.  Will check lung function test.

## 2018-06-01 ENCOUNTER — Encounter: Payer: Self-pay | Admitting: Oncology

## 2018-06-01 ENCOUNTER — Other Ambulatory Visit: Payer: Self-pay

## 2018-06-01 ENCOUNTER — Other Ambulatory Visit: Payer: Self-pay | Admitting: *Deleted

## 2018-06-01 ENCOUNTER — Inpatient Hospital Stay: Payer: Medicare PPO

## 2018-06-01 ENCOUNTER — Inpatient Hospital Stay (HOSPITAL_BASED_OUTPATIENT_CLINIC_OR_DEPARTMENT_OTHER): Payer: Medicare PPO | Admitting: Oncology

## 2018-06-01 ENCOUNTER — Inpatient Hospital Stay: Payer: Medicare PPO | Attending: Oncology

## 2018-06-01 VITALS — BP 137/70 | HR 60 | Temp 97.1°F | Resp 16 | Ht 62.0 in | Wt 137.4 lb

## 2018-06-01 DIAGNOSIS — H5789 Other specified disorders of eye and adnexa: Secondary | ICD-10-CM | POA: Diagnosis not present

## 2018-06-01 DIAGNOSIS — M549 Dorsalgia, unspecified: Secondary | ICD-10-CM | POA: Insufficient documentation

## 2018-06-01 DIAGNOSIS — I82721 Chronic embolism and thrombosis of deep veins of right upper extremity: Secondary | ICD-10-CM | POA: Diagnosis not present

## 2018-06-01 DIAGNOSIS — R600 Localized edema: Secondary | ICD-10-CM

## 2018-06-01 DIAGNOSIS — D696 Thrombocytopenia, unspecified: Secondary | ICD-10-CM

## 2018-06-01 DIAGNOSIS — R51 Headache: Secondary | ICD-10-CM | POA: Insufficient documentation

## 2018-06-01 DIAGNOSIS — C9002 Multiple myeloma in relapse: Secondary | ICD-10-CM

## 2018-06-01 DIAGNOSIS — D702 Other drug-induced agranulocytosis: Secondary | ICD-10-CM | POA: Insufficient documentation

## 2018-06-01 DIAGNOSIS — R519 Headache, unspecified: Secondary | ICD-10-CM

## 2018-06-01 DIAGNOSIS — Z87891 Personal history of nicotine dependence: Secondary | ICD-10-CM | POA: Insufficient documentation

## 2018-06-01 DIAGNOSIS — Z7901 Long term (current) use of anticoagulants: Secondary | ICD-10-CM | POA: Insufficient documentation

## 2018-06-01 DIAGNOSIS — R5383 Other fatigue: Secondary | ICD-10-CM | POA: Diagnosis not present

## 2018-06-01 DIAGNOSIS — Z5112 Encounter for antineoplastic immunotherapy: Secondary | ICD-10-CM | POA: Diagnosis not present

## 2018-06-01 DIAGNOSIS — C7951 Secondary malignant neoplasm of bone: Secondary | ICD-10-CM | POA: Insufficient documentation

## 2018-06-01 DIAGNOSIS — G8929 Other chronic pain: Secondary | ICD-10-CM

## 2018-06-01 DIAGNOSIS — T451X5S Adverse effect of antineoplastic and immunosuppressive drugs, sequela: Secondary | ICD-10-CM | POA: Diagnosis not present

## 2018-06-01 DIAGNOSIS — K219 Gastro-esophageal reflux disease without esophagitis: Secondary | ICD-10-CM | POA: Insufficient documentation

## 2018-06-01 DIAGNOSIS — I10 Essential (primary) hypertension: Secondary | ICD-10-CM

## 2018-06-01 DIAGNOSIS — E785 Hyperlipidemia, unspecified: Secondary | ICD-10-CM

## 2018-06-01 DIAGNOSIS — Z5111 Encounter for antineoplastic chemotherapy: Secondary | ICD-10-CM

## 2018-06-01 DIAGNOSIS — Z86718 Personal history of other venous thrombosis and embolism: Secondary | ICD-10-CM

## 2018-06-01 DIAGNOSIS — Z79899 Other long term (current) drug therapy: Secondary | ICD-10-CM

## 2018-06-01 DIAGNOSIS — J449 Chronic obstructive pulmonary disease, unspecified: Secondary | ICD-10-CM | POA: Diagnosis not present

## 2018-06-01 DIAGNOSIS — R251 Tremor, unspecified: Secondary | ICD-10-CM | POA: Diagnosis not present

## 2018-06-01 DIAGNOSIS — R5381 Other malaise: Secondary | ICD-10-CM

## 2018-06-01 LAB — CBC WITH DIFFERENTIAL/PLATELET
Basophils Absolute: 0 10*3/uL (ref 0–0.1)
Basophils Relative: 1 %
EOS ABS: 0.4 10*3/uL (ref 0–0.7)
EOS PCT: 14 %
HCT: 38.5 % (ref 35.0–47.0)
Hemoglobin: 13.2 g/dL (ref 12.0–16.0)
LYMPHS ABS: 0.7 10*3/uL — AB (ref 1.0–3.6)
LYMPHS PCT: 25 %
MCH: 29.9 pg (ref 26.0–34.0)
MCHC: 34.4 g/dL (ref 32.0–36.0)
MCV: 87 fL (ref 80.0–100.0)
Monocytes Absolute: 1 10*3/uL — ABNORMAL HIGH (ref 0.2–0.9)
Monocytes Relative: 32 %
Neutro Abs: 0.9 10*3/uL — ABNORMAL LOW (ref 1.4–6.5)
Neutrophils Relative %: 28 %
PLATELETS: 126 10*3/uL — AB (ref 150–440)
RBC: 4.43 MIL/uL (ref 3.80–5.20)
RDW: 14.9 % — AB (ref 11.5–14.5)
WBC: 3 10*3/uL — AB (ref 3.6–11.0)

## 2018-06-01 LAB — COMPREHENSIVE METABOLIC PANEL
ALBUMIN: 3.9 g/dL (ref 3.5–5.0)
ALT: 19 U/L (ref 0–44)
AST: 17 U/L (ref 15–41)
Alkaline Phosphatase: 63 U/L (ref 38–126)
Anion gap: 10 (ref 5–15)
BILIRUBIN TOTAL: 1 mg/dL (ref 0.3–1.2)
BUN: 17 mg/dL (ref 8–23)
CHLORIDE: 105 mmol/L (ref 98–111)
CO2: 25 mmol/L (ref 22–32)
CREATININE: 0.82 mg/dL (ref 0.44–1.00)
Calcium: 9.5 mg/dL (ref 8.9–10.3)
GFR calc Af Amer: >60 mL/min (ref >60)
GLUCOSE: 108 mg/dL — AB (ref 70–99)
POTASSIUM: 4.4 mmol/L (ref 3.5–5.1)
Sodium: 140 mmol/L (ref 135–145)
Total Protein: 6.9 g/dL (ref 6.5–8.1)

## 2018-06-01 MED ORDER — SODIUM CHLORIDE 0.9 % IV SOLN
20.0000 mg | Freq: Once | INTRAVENOUS | Status: AC
  Administered 2018-06-01: 20 mg via INTRAVENOUS
  Filled 2018-06-01: qty 2

## 2018-06-01 MED ORDER — DIPHENHYDRAMINE HCL 25 MG PO CAPS
50.0000 mg | ORAL_CAPSULE | Freq: Once | ORAL | Status: AC
  Administered 2018-06-01: 50 mg via ORAL
  Filled 2018-06-01: qty 2

## 2018-06-01 MED ORDER — SODIUM CHLORIDE 0.9 % IV SOLN
Freq: Once | INTRAVENOUS | Status: AC
  Administered 2018-06-01: 10:00:00 via INTRAVENOUS
  Filled 2018-06-01: qty 1000

## 2018-06-01 MED ORDER — POMALIDOMIDE 3 MG PO CAPS: 3.0000 mg | ORAL_CAPSULE | Freq: Every day | ORAL | 0 refills | Status: DC

## 2018-06-01 MED ORDER — SODIUM CHLORIDE 0.9 % IV SOLN
900.0000 mg | Freq: Once | INTRAVENOUS | Status: AC
  Administered 2018-06-01: 900 mg via INTRAVENOUS
  Filled 2018-06-01: qty 40

## 2018-06-01 MED ORDER — PROCHLORPERAZINE MALEATE 10 MG PO TABS
10.0000 mg | ORAL_TABLET | Freq: Once | ORAL | Status: AC
  Administered 2018-06-01: 10 mg via ORAL
  Filled 2018-06-01: qty 1

## 2018-06-01 MED ORDER — SODIUM CHLORIDE 0.9 % IV SOLN: 16.0000 mg/kg | Freq: Once | INTRAVENOUS | Status: DC

## 2018-06-01 MED ORDER — ACETAMINOPHEN 325 MG PO TABS
650.0000 mg | ORAL_TABLET | Freq: Once | ORAL | Status: AC
  Administered 2018-06-01: 650 mg via ORAL
  Filled 2018-06-01: qty 2

## 2018-06-01 NOTE — Progress Notes (Signed)
Patient c/o hair loss since getting granix and headache on the left side of face since receiving xgeva.  Patient states that headache today is a 10/10 today.

## 2018-06-01 NOTE — Progress Notes (Addendum)
Hematology/Oncology Follow up note [facility] [phone] [phone]   Patient Care Team: McLean-Scocuzza, Nino Glow, MD as PCP - General (Internal Medicine)  REFERRING PROVIDER: Dr. Baruch Gouty, Donette  REASON FOR VISIT Follow up for treatment of multiple myeloma.   HISTORY OF PRESENTING ILLNESS:  Cassie Jones is a  72 y.o.  female with PMH listed below who was referred to me for evaluation of multiple myeloma.  Patient used to follow-up with local oncologist Dr. Tonia Brooms and [facility] oncologist Dr. Amalia Hailey.  Patient tells me that as her husband works every day and has a busy working schedule, she is not able to get transportation to her treatment.  As an alternative she is now living with her brother who is close to [facility] [place] and want to transfer care here. Extensive medical record review was performed.    Patient was diagnosed with IgG lambda, ISS2, t (4,14) multiple myeloma on February 03, 2017, she has hemoglobin of 9, calcium 8.9, LDH 132, beta microglobulin 4.6, albumin 3.2.  Skeletal survey negative.  PET scan with single nonspecific humeral lesion and a thyroid nodule.  Marrow with 60% plasmacytosis by CD138 IHC, FISH with hyperdiploidy t(4, 14), 1 q. gain.. Per note, she was treated with CyborD (M spike 3.6 at start)  from 01/2017 to 02/2017. Her treatment was switched to Revlimid Velcade dexamethasone from 02/2017, M spike 1.2 at start,and was found to have progression of disease in 09/2017, when her M spike increased to 1.8, and developed compression fracture in 10/2016. She was restarted on RVD using weekly Velcade.  She was recently seen by Dr. Boris Sharper and recommended to start on a combination of daratumumab, Pomalyst, dexamethasone. Per patient she received her first daratumumab last week with split dose 40m/kg given on 3/14 and 3/15.  Patient reports that she tolerate the treatment well except mild infusion  reaction.  # Patient also reports that she has got dental clearance and has been given 1 dose of Zometa by Dr. VRemus Loffler(unkown dates).  [facility]  # Patient was seen by nurse practitioner last week prior to her cycle 2 daratumumab.  She reports feeling shortness of breath, having cough, believed to have bronchitis and was prescribed Levaquin 500 mg p.o. daily for 7 days, and albuterol as needed.  She also had a VQ scan low probability for pulmonary embolism.  # 01/29/2018 5th weekly treatment of Daratumumab,  Cycle 2 Pomalyst was started for a week and was nterrupted due to acute bronchitis treated with outpatient Azithromycin.    # 02/16/2018 developed right upper extremity swelling and tenderness, confirmed to be DVT. She was taking Aspirin.  Started on Eliquis. Aspirin is stopped.  # 02/16/2018 6th Daratumumab treatment. Was started on Cycle 3 Pomalyst, she has completed about 2 weeks treatment. She was admitted to hospital on 02/21/2018 for Klebsiella UTI and oncology was not consulted. She was treatment with antibiotics and continued on Pomalyst. She was hospitalized again on 03/02/2018 for pneumonia, COPD exacerbation/emphasema and oncology was consulted and patient was seen by my colleague Dr.Finnergan and advise patient to stop Pomalyst.   # [facility] recommends dose reduced Pomalyst 391m This was previously discussed with patient, and she was reluctant to be restarted on Pomalyst. Today she seems to be more acceptable for restarting Pomalyst, but requests to start in a few weeks so that she can  have a nice break.   #Previously declined bone marrow transplant evaluation.    INTERVAL HISTORY Sahvanna Mcmanigal is a 72 y.o. female who has above history reviewed by me presents for assessment prior to antineoplastic immunotherapy and to follow-up for chemotherapy toxicity  assessment.  #Headache, which is a new issue for her.  Patient reports having headache for about 2-3 weeks, started after Xgeva and Granix injection. Location: left sided, forehead and behind her left eye. Feels that " my left eye is falling out".  Not associated with photophobia, nausea or vomiting. Denies visual disturbance, focal weakness.  No aggravating factors or alleviated factors.  She takes Tylenol every 4 hours without any symptom relief. Rate the pain as 10/10    #Back pain, stable.  #Fatigue: stable. #Shortness of breath: chronic, stable.  Denies any dysuria symptoms, fever or chills.   Data reviewed:  Multiple myeloma labs obtained at last visit:  03/06/2018 SPEP showed VGPR with >90% reduction of her M protein. Normalized free light chain ratio. 05/04/2018 SPEP continued to show stable M protein, at 0.2g/L,   Current Treatment  # 3/14, 3/15 at outside facility, she got split dose for first treatment of daratumumab. #  01/08/2018 Start on weekly daratumumab '16mg'$ /m2, dexamethasone '20mg'$ ,  S/p # Palliative RT to spine. 01/08/2018 started cycle 1 Pomalyst '4mg'$  Day 1-21 ogf 28 day cycle.  Pomalyst 4 mg was held after 3 cycles with multiple interruptions due to neutropenia and recurrent pneumonia. Pomalyst was restarted at a lower dose of 3 mg on 04/06/2018 [cycle 4] . This cycle of Pomalyst was  disrreupted for 4 days due to UTI. She was instructed to restart and was able to finish this cycle.   05/11/2018 Cycle 5 Pomalyst delayed one week as she needs to get deep dental clean before obtaining dental clearance for Xgeva.  05/18/2018 got Xgeva.   Current Pain medication: Oxycodone '5mg'$  Q8h as needed. Oxycontin '10mg'$  Q12h.   Review of Systems  Constitutional: Positive for malaise/fatigue. Negative for chills, fever and weight loss.  HENT: Negative for congestion, ear discharge, ear pain, hearing loss, nosebleeds, sinus pain and sore throat.   Eyes: Negative for blurred vision, double  vision, photophobia, discharge and redness.  Respiratory: Negative for cough, hemoptysis, sputum production, shortness of breath and wheezing.   Cardiovascular: Negative for chest pain, palpitations, orthopnea, claudication and leg swelling.  Gastrointestinal: Negative for abdominal pain, blood in stool, constipation, diarrhea, heartburn, melena, nausea and vomiting.  Genitourinary: Negative for dysuria, flank pain, frequency, hematuria and urgency.  Musculoskeletal: Negative for back pain, joint pain, myalgias and neck pain.       Leg swelling   Skin: Negative for itching and rash.  Neurological: Positive for headaches. Negative for dizziness, tingling, tremors, sensory change, speech change, focal weakness and weakness.  Endo/Heme/Allergies: Negative for environmental allergies. Does not bruise/bleed easily.  Psychiatric/Behavioral: Negative for depression, hallucinations, substance abuse and suicidal ideas. The patient is not nervous/anxious.     MEDICAL HISTORY:  Past Medical History:  Diagnosis Date  . Ascites   . Asthma   . Bilateral leg edema   . Cancer ([place])   . Chicken pox   . Colon polyps   . Diverticulitis    with perforation 02/2017 and hosp with colostomy bag s/p removal   . Drug-induced neutropenia ([place]) 03/09/2018  . GERD (gastroesophageal reflux disease)   . Hyperlipidemia   . Hypertension   . Multiple myeloma ([place])    dx'ed 01/2018 follows Dr. Tasia Catchings and  [facility]   . Pleural effusion   . Pneumonia    03/02/18   . Thyroid nodule   . UTI (urinary tract infection)   . Vitamin D deficiency     SURGICAL HISTORY: Past Surgical History:  Procedure Laterality Date  . ABDOMINAL HYSTERECTOMY    . BREAST BIOPSY Bilateral yrs ago   benign  . CHOLECYSTECTOMY    . COLOSTOMY  2018  . COLOSTOMY REVERSAL     11 2018  . KYPHOPLASTY     11/2017 [place]     SOCIAL HISTORY: Social History   Socioeconomic History  . Marital status: Married    Spouse name: Not on file   . Number of children: Not on file  . Years of education: Not on file  . Highest education level: Not on file  Occupational History  . Not on file  Social Needs  . Financial resource strain: Not hard at all  . Food insecurity:    Worry: Never true    Inability: Never true  . Transportation needs:    Medical: No    Non-medical: No  Tobacco Use  . Smoking status: Former Research scientist (life sciences)  . Smokeless tobacco: Never Used  Substance and Sexual Activity  . Alcohol use: Not Currently  . Drug use: Never  . Sexual activity: Yes    Comment: husband   Lifestyle  . Physical activity:    Days per week: 0 days    Minutes per session: 0 min  . Stress: Not at all  Relationships  . Social connections:    Talks on phone: More than three times a week    Gets together: Once a week    Attends religious service: 1 to 4 times per year    Active member of club or organization: No    Attends meetings of clubs or organizations: Never    Relationship status: Married  . Intimate partner violence:    Fear of current or ex partner: No    Emotionally abused: No    Physically abused: No    Forced sexual activity: No  Other Topics Concern  . Not on file  Social History Narrative   Married    Husband lives in [place] while she gets treatment in this area    She lives alone in this area with family close by     FAMILY HISTORY: Family History  Problem Relation Age of Onset  . Breast cancer Mother 70  . Cancer Mother        breast   . Diabetes Mother   . Heart disease Mother   . Hyperlipidemia Mother   . Hypertension Mother   . Birth defects Brother   . Diabetes Brother   . Heart disease Brother   . Hyperlipidemia Brother   . Cancer Father        prostate met to liver     ALLERGIES:  is allergic to codeine.  MEDICATIONS:  Current Outpatient Medications  Medication Sig Dispense Refill  . acetaminophen (TYLENOL) 500 MG tablet Take 1,000 mg by mouth every 6 (six) hours as needed for mild pain  or fever.    Marland Kitchen albuterol (VENTOLIN HFA) 108 (90 Base) MCG/ACT inhaler Inhale 2 puffs into the lungs every 6 (six) hours as needed. 1 Inhaler 1  . apixaban (ELIQUIS) 5 MG TABS tablet Take 1 tablet (5 mg total) by mouth 2 (two) times daily. 60 tablet 3  . calcium-vitamin D (OSCAL WITH D) 500-200 MG-UNIT tablet Take  1 tablet by mouth daily. 30 tablet 0  . Cholecalciferol 50000 units capsule Take 1 capsule (50,000 Units total) by mouth once a week. 13 capsule 1  . feeding supplement, ENSURE ENLIVE, (ENSURE ENLIVE) LIQD Take 237 mLs by mouth 3 (three) times daily between meals. 237 mL 12  . fluticasone (FLONASE) 50 MCG/ACT nasal spray Place 1-2 sprays into both nostrils daily. (Patient taking differently: Place 1-2 sprays into both nostrils daily as needed. ) 16 g 2  . fluticasone (FLOVENT DISKUS) 50 MCG/BLIST diskus inhaler Inhale 1 puff into the lungs 2 (two) times daily. (Patient taking differently: Inhale 1 puff into the lungs 2 (two) times daily as needed. ) 1 Inhaler 3  . furosemide (LASIX) 20 MG tablet Take 1 tablet (20 mg total) by mouth daily as needed. 30 tablet 2  . loperamide (IMODIUM A-D) 2 MG tablet Take 2 mg by mouth 4 (four) times daily as needed for diarrhea or loose stools.    . montelukast (SINGULAIR) 10 MG tablet Take 10 mg by mouth at bedtime.    . ondansetron (ZOFRAN) 8 MG tablet Take 8 mg by mouth every 8 (eight) hours as needed for nausea.    Marland Kitchen oxyCODONE (OXY IR/ROXICODONE) 5 MG immediate release tablet Take 5 mg by mouth 3 (three) times daily as needed for moderate pain.    Marland Kitchen oxyCODONE (OXYCONTIN) 10 mg 12 hr tablet Take 10 mg by mouth every 12 (twelve) hours as needed (severe pain).    . pantoprazole (PROTONIX) 40 MG tablet Take 1 tablet by mouth every morning.    . pomalidomide (POMALYST) 3 MG capsule Take 1 capsule (3 mg total) by mouth daily. Take with water on days 1-21. Repeat every 28 days. 21 capsule 0  . potassium chloride SA (K-DUR,KLOR-CON) 20 MEQ tablet Take 1 tablet  (20 mEq total) by mouth daily. 30 tablet 2  . pravastatin (PRAVACHOL) 10 MG tablet Take 1 tablet by mouth at bedtime.    . prochlorperazine (COMPAZINE) 10 MG tablet Take 10 mg by mouth every 6 (six) hours as needed for nausea.    Marland Kitchen tiotropium (SPIRIVA) 18 MCG inhalation capsule Place 1 capsule (18 mcg total) into inhaler and inhale daily. 30 capsule 6   No current facility-administered medications for this visit.    Facility-Administered Medications Ordered in Other Visits  Medication Dose Route Frequency Provider Last Rate Last Dose  . montelukast (SINGULAIR) tablet 10 mg  10 mg Oral QHS Earlie Server, MD   10 mg at 04/20/18 1029     PHYSICAL EXAMINATION: ECOG PERFORMANCE STATUS: 1 - Symptomatic but completely ambulatory Vitals:   06/01/18 0841  BP: 137/70  Pulse: 60  Resp: 16  Temp: (!) 97.1 F (36.2 C)   Filed Weights   06/01/18 0841  Weight: 137 lb 7 oz (62.3 kg)    Physical Exam  Constitutional: She is oriented to person, place, and time and well-developed, well-nourished, and in no distress. No distress.  HENT:  Head: Normocephalic and atraumatic.  Nose: Nose normal.  Mouth/Throat: Oropharynx is clear and moist. No oropharyngeal exudate.  Eyes: Pupils are equal, round, and reactive to light. EOM are normal. Left eye exhibits no discharge. No scleral icterus.  Neck: Normal range of motion. Neck supple. No JVD present. No tracheal deviation present.  Cardiovascular: Normal rate, regular rhythm and normal heart sounds.  No murmur heard. Pulmonary/Chest: Effort normal and breath sounds normal. No respiratory distress. She has no wheezes. She has no rales. She exhibits no  tenderness.  Decreased breath sounds bilaterally.   Abdominal: Soft. She exhibits no distension and no mass. There is no tenderness. There is no rebound and no guarding.  Musculoskeletal: Normal range of motion. She exhibits no tenderness.  Bilateral 1+edema.   Lymphadenopathy:    She has no cervical  adenopathy.  Neurological: She is alert and oriented to person, place, and time. No cranial nerve deficit. She exhibits normal muscle tone. Coordination normal.  Skin: Skin is warm and dry. No rash noted. She is not diaphoretic. No erythema. No pallor.  Psychiatric: Memory, affect and judgment normal.     LABORATORY DATA:  I have reviewed the data as listed Lab Results  Component Value Date   WBC 3.0 (L) 06/01/2018   HGB 13.2 06/01/2018   HCT 38.5 06/01/2018   MCV 87.0 06/01/2018   PLT 126 (L) 06/01/2018   Recent Labs    04/20/18 0827 05/04/18 0824 05/18/18 0825  NA 143 141 140  K 3.3* 4.5 3.5  CL 107 109 106  CO2 '28 24 25  '$ GLUCOSE 108* 105* 103*  BUN '11 16 12  '$ CREATININE 0.70 0.76 0.72  CALCIUM 9.1 10.1 9.3  GFRNONAA >60 >60 >60  GFRAA >60 >60 >60  PROT 6.3* 6.5 6.3*  ALBUMIN 3.3* 3.6 3.4*  AST '16 15 16  '$ ALT '17 17 15  '$ ALKPHOS 60 66 57  BILITOT 1.0 0.8 0.8       ASSESSMENT & PLAN:  1. Multiple myeloma in relapse ([place])   2. Drug-induced neutropenia ([facility])   3. Bone metastasis ([place])   4. Encounter for antineoplastic immunotherapy   5. Encounter for antineoplastic chemotherapy   6. Thrombocytopenia ([place])   7. Chronic nonintractable headache, unspecified headache type    #Labs reviewed and discussed with patient. She reports having one more dose of Pomalyst '3mg'$  left for cycle 5.  Acceptable to proceed with daratumumab treatment today.  # Drug induced neutropenia: ANC is 0.9,  plan Granix x 1 tomorrow and hopefully she can recover to start cycle 6 Pomalyst in 1 week.  She will follow up visit with  [facility] Dr.Tuchman tomorrow as well.  Repeat CBC on 06/07/2018, prior to start cycle 6 Pomalyst.  Multiple myeloma panel was drawn and results pending.   #Bone metastasis, .  S/p Xgeva on 05/18/2018 .  Advised patient to take calcium with vitamin D supplements daily. Marland Kitchen  #Bilateral lower extremity swelling, 2D echo showed normal LVEF.  Advised patient to continue  spironolactone and start Lasix 20 mg daily with potassium supplementation.   #Chronic DVT, continue eliquis.  # Thrombocytopenia, likely due to Pomalyst. Monitor.  # Headache, ? Relating to xgeva. Suggests that she may use oxycodone PRN for pain control. Advise patient to obtain ophthalmology evaluation.   All questions were answered. The patient knows to call the clinic with any problems questions or concerns.  Return of visit: 2 weeks.    Total face to face encounter time for this patient visit was 25  min. >50% of the time was  spent in counseling and coordination of care.   Earlie Server, MD, PhD Hematology Oncology [facility] at [facility] Pager- [phone] 06/01/2018

## 2018-06-02 ENCOUNTER — Inpatient Hospital Stay: Payer: Medicare PPO

## 2018-06-02 DIAGNOSIS — D702 Other drug-induced agranulocytosis: Secondary | ICD-10-CM

## 2018-06-02 DIAGNOSIS — Z5112 Encounter for antineoplastic immunotherapy: Secondary | ICD-10-CM | POA: Diagnosis not present

## 2018-06-02 LAB — KAPPA/LAMBDA LIGHT CHAINS
KAPPA FREE LGHT CHN: 4.7 mg/L (ref 3.3–19.4)
Kappa, lambda light chain ratio: 0.27 (ref 0.26–1.65)
LAMDA FREE LIGHT CHAINS: 17.2 mg/L (ref 5.7–26.3)

## 2018-06-02 MED ORDER — FILGRASTIM 300 MCG/0.5ML IJ SOSY
300.0000 ug | PREFILLED_SYRINGE | Freq: Once | INTRAMUSCULAR | Status: AC
  Administered 2018-06-02: 300 ug via SUBCUTANEOUS
  Filled 2018-06-02: qty 0.5

## 2018-06-03 LAB — MULTIPLE MYELOMA PANEL, SERUM
ALBUMIN/GLOB SERPL: 1.4 (ref 0.7–1.7)
ALPHA 1: 0.3 g/dL (ref 0.0–0.4)
ALPHA2 GLOB SERPL ELPH-MCNC: 1.1 g/dL — AB (ref 0.4–1.0)
Albumin SerPl Elph-Mcnc: 3.6 g/dL (ref 2.9–4.4)
B-Globulin SerPl Elph-Mcnc: 1 g/dL (ref 0.7–1.3)
GAMMA GLOB SERPL ELPH-MCNC: 0.4 g/dL (ref 0.4–1.8)
GLOBULIN, TOTAL: 2.7 g/dL (ref 2.2–3.9)
IGA: 14 mg/dL — AB (ref 87–352)
IgG (Immunoglobin G), Serum: 550 mg/dL — ABNORMAL LOW (ref 700–1600)
IgM (Immunoglobulin M), Srm: 13 mg/dL — ABNORMAL LOW (ref 26–217)
M Protein SerPl Elph-Mcnc: 0.2 g/dL — ABNORMAL HIGH
Total Protein ELP: 6.3 g/dL (ref 6.0–8.5)

## 2018-06-07 ENCOUNTER — Other Ambulatory Visit: Payer: Self-pay | Admitting: Oncology

## 2018-06-07 ENCOUNTER — Inpatient Hospital Stay: Payer: Medicare PPO

## 2018-06-07 DIAGNOSIS — D702 Other drug-induced agranulocytosis: Secondary | ICD-10-CM

## 2018-06-07 DIAGNOSIS — Z5112 Encounter for antineoplastic immunotherapy: Secondary | ICD-10-CM | POA: Diagnosis not present

## 2018-06-07 LAB — CBC WITH DIFFERENTIAL/PLATELET
BASOS ABS: 0.1 10*3/uL (ref 0–0.1)
BASOS PCT: 2 %
EOS ABS: 0.1 10*3/uL (ref 0–0.7)
EOS PCT: 3 %
HCT: 35.9 % (ref 35.0–47.0)
Hemoglobin: 11.9 g/dL — ABNORMAL LOW (ref 12.0–16.0)
Lymphocytes Relative: 33 %
Lymphs Abs: 1.4 10*3/uL (ref 1.0–3.6)
MCH: 29.2 pg (ref 26.0–34.0)
MCHC: 33 g/dL (ref 32.0–36.0)
MCV: 88.5 fL (ref 80.0–100.0)
MONO ABS: 1.1 10*3/uL — AB (ref 0.2–0.9)
Monocytes Relative: 26 %
Neutro Abs: 1.6 10*3/uL (ref 1.4–6.5)
Neutrophils Relative %: 36 %
PLATELETS: 185 10*3/uL (ref 150–440)
RBC: 4.06 MIL/uL (ref 3.80–5.20)
RDW: 14.9 % — AB (ref 11.5–14.5)
WBC: 4.3 10*3/uL (ref 3.6–11.0)

## 2018-06-11 ENCOUNTER — Other Ambulatory Visit: Payer: Self-pay | Admitting: *Deleted

## 2018-06-11 DIAGNOSIS — C9002 Multiple myeloma in relapse: Secondary | ICD-10-CM

## 2018-06-15 ENCOUNTER — Inpatient Hospital Stay: Payer: Medicare PPO

## 2018-06-15 ENCOUNTER — Encounter: Payer: Self-pay | Admitting: Oncology

## 2018-06-15 ENCOUNTER — Other Ambulatory Visit: Payer: Self-pay

## 2018-06-15 ENCOUNTER — Inpatient Hospital Stay (HOSPITAL_BASED_OUTPATIENT_CLINIC_OR_DEPARTMENT_OTHER): Payer: Medicare PPO | Admitting: Oncology

## 2018-06-15 VITALS — BP 138/75 | HR 64 | Temp 95.0°F | Resp 18

## 2018-06-15 VITALS — BP 130/70 | HR 60 | Temp 96.6°F | Resp 18 | Wt 140.8 lb

## 2018-06-15 DIAGNOSIS — H5789 Other specified disorders of eye and adnexa: Secondary | ICD-10-CM

## 2018-06-15 DIAGNOSIS — Z7901 Long term (current) use of anticoagulants: Secondary | ICD-10-CM

## 2018-06-15 DIAGNOSIS — K219 Gastro-esophageal reflux disease without esophagitis: Secondary | ICD-10-CM

## 2018-06-15 DIAGNOSIS — Z87891 Personal history of nicotine dependence: Secondary | ICD-10-CM

## 2018-06-15 DIAGNOSIS — J449 Chronic obstructive pulmonary disease, unspecified: Secondary | ICD-10-CM

## 2018-06-15 DIAGNOSIS — R600 Localized edema: Secondary | ICD-10-CM

## 2018-06-15 DIAGNOSIS — R5381 Other malaise: Secondary | ICD-10-CM

## 2018-06-15 DIAGNOSIS — I10 Essential (primary) hypertension: Secondary | ICD-10-CM

## 2018-06-15 DIAGNOSIS — R51 Headache: Secondary | ICD-10-CM

## 2018-06-15 DIAGNOSIS — M549 Dorsalgia, unspecified: Secondary | ICD-10-CM

## 2018-06-15 DIAGNOSIS — Z5112 Encounter for antineoplastic immunotherapy: Secondary | ICD-10-CM | POA: Diagnosis not present

## 2018-06-15 DIAGNOSIS — C9002 Multiple myeloma in relapse: Secondary | ICD-10-CM | POA: Diagnosis not present

## 2018-06-15 DIAGNOSIS — T451X5S Adverse effect of antineoplastic and immunosuppressive drugs, sequela: Secondary | ICD-10-CM

## 2018-06-15 DIAGNOSIS — D696 Thrombocytopenia, unspecified: Secondary | ICD-10-CM

## 2018-06-15 DIAGNOSIS — C7951 Secondary malignant neoplasm of bone: Secondary | ICD-10-CM | POA: Diagnosis not present

## 2018-06-15 DIAGNOSIS — R5383 Other fatigue: Secondary | ICD-10-CM

## 2018-06-15 DIAGNOSIS — R251 Tremor, unspecified: Secondary | ICD-10-CM

## 2018-06-15 DIAGNOSIS — I82721 Chronic embolism and thrombosis of deep veins of right upper extremity: Secondary | ICD-10-CM

## 2018-06-15 DIAGNOSIS — Z79899 Other long term (current) drug therapy: Secondary | ICD-10-CM

## 2018-06-15 DIAGNOSIS — D702 Other drug-induced agranulocytosis: Secondary | ICD-10-CM | POA: Diagnosis not present

## 2018-06-15 DIAGNOSIS — E785 Hyperlipidemia, unspecified: Secondary | ICD-10-CM

## 2018-06-15 LAB — COMPREHENSIVE METABOLIC PANEL
ALBUMIN: 3.6 g/dL (ref 3.5–5.0)
ALT: 17 U/L (ref 0–44)
ANION GAP: 7 (ref 5–15)
AST: 16 U/L (ref 15–41)
Alkaline Phosphatase: 58 U/L (ref 38–126)
BUN: 13 mg/dL (ref 8–23)
CHLORIDE: 108 mmol/L (ref 98–111)
CO2: 25 mmol/L (ref 22–32)
Calcium: 8.6 mg/dL — ABNORMAL LOW (ref 8.9–10.3)
Creatinine, Ser: 0.72 mg/dL (ref 0.44–1.00)
GFR calc Af Amer: >60 mL/min (ref >60)
GFR calc non Af Amer: >60 mL/min (ref >60)
GLUCOSE: 106 mg/dL — AB (ref 70–99)
POTASSIUM: 3.7 mmol/L (ref 3.5–5.1)
SODIUM: 140 mmol/L (ref 135–145)
Total Bilirubin: 0.8 mg/dL (ref 0.3–1.2)
Total Protein: 6.4 g/dL — ABNORMAL LOW (ref 6.5–8.1)

## 2018-06-15 LAB — CBC WITH DIFFERENTIAL/PLATELET
Basophils Absolute: 0 10*3/uL (ref 0–0.1)
Basophils Relative: 1 %
Eosinophils Absolute: 0.1 10*3/uL (ref 0–0.7)
Eosinophils Relative: 2 %
HEMATOCRIT: 36.5 % (ref 35.0–47.0)
HEMOGLOBIN: 12.4 g/dL (ref 12.0–16.0)
LYMPHS PCT: 14 %
Lymphs Abs: 0.8 10*3/uL — ABNORMAL LOW (ref 1.0–3.6)
MCH: 29.7 pg (ref 26.0–34.0)
MCHC: 33.9 g/dL (ref 32.0–36.0)
MCV: 87.5 fL (ref 80.0–100.0)
MONO ABS: 0.3 10*3/uL (ref 0.2–0.9)
MONOS PCT: 5 %
NEUTROS PCT: 78 %
Neutro Abs: 4.7 10*3/uL (ref 1.4–6.5)
Platelets: 226 10*3/uL (ref 150–440)
RBC: 4.17 MIL/uL (ref 3.80–5.20)
RDW: 14.8 % — ABNORMAL HIGH (ref 11.5–14.5)
WBC: 5.9 10*3/uL (ref 3.6–11.0)

## 2018-06-15 MED ORDER — DIPHENHYDRAMINE HCL 25 MG PO CAPS
50.0000 mg | ORAL_CAPSULE | Freq: Once | ORAL | Status: AC
  Administered 2018-06-15: 50 mg via ORAL
  Filled 2018-06-15: qty 2

## 2018-06-15 MED ORDER — SODIUM CHLORIDE 0.9 % IV SOLN
1000.0000 mg | Freq: Once | INTRAVENOUS | Status: AC
  Administered 2018-06-15: 1000 mg via INTRAVENOUS
  Filled 2018-06-15: qty 40

## 2018-06-15 MED ORDER — SODIUM CHLORIDE 0.9 % IV SOLN
Freq: Once | INTRAVENOUS | Status: AC
  Administered 2018-06-15: 10:00:00 via INTRAVENOUS
  Filled 2018-06-15: qty 250

## 2018-06-15 MED ORDER — SODIUM CHLORIDE 0.9 % IV SOLN
20.0000 mg | Freq: Once | INTRAVENOUS | Status: AC
  Administered 2018-06-15: 20 mg via INTRAVENOUS
  Filled 2018-06-15: qty 2

## 2018-06-15 MED ORDER — ACETAMINOPHEN 325 MG PO TABS
650.0000 mg | ORAL_TABLET | Freq: Once | ORAL | Status: AC
  Administered 2018-06-15: 650 mg via ORAL
  Filled 2018-06-15: qty 2

## 2018-06-15 MED ORDER — PROCHLORPERAZINE MALEATE 10 MG PO TABS
10.0000 mg | ORAL_TABLET | Freq: Once | ORAL | Status: AC
  Administered 2018-06-15: 10 mg via ORAL
  Filled 2018-06-15: qty 1

## 2018-06-15 NOTE — Progress Notes (Signed)
Patient here for follow up. Concerned about shakiness in hands. Pt requesting refill for zofran.

## 2018-06-15 NOTE — Progress Notes (Signed)
Hematology/Oncology Follow up note [facility] [phone] [phone]   Patient Care Team: McLean-Scocuzza, Nino Glow, MD as PCP - General (Internal Medicine)  REFERRING PROVIDER: Dr. Baruch Gouty, Donette  REASON FOR VISIT Follow up for treatment of multiple myeloma.   HISTORY OF PRESENTING ILLNESS:  Cassie Jones is a  72 y.o.  female with PMH listed below who was referred to me for evaluation of multiple myeloma.  Patient used to follow-up with local oncologist Dr. Tonia Brooms and [facility] oncologist Dr. Amalia Hailey.  Patient tells me that as her husband works every day and has a busy working schedule, she is not able to get transportation to her treatment.  As an alternative she is now living with her brother who is close to [facility] [place] and want to transfer care here. Extensive medical record review was performed.    Patient was diagnosed with IgG lambda, ISS2, t (4,14) multiple myeloma on February 03, 2017, she has hemoglobin of 9, calcium 8.9, LDH 132, beta microglobulin 4.6, albumin 3.2.  Skeletal survey negative.  PET scan with single nonspecific humeral lesion and a thyroid nodule.  Marrow with 60% plasmacytosis by CD138 IHC, FISH with hyperdiploidy t(4, 14), 1 q. gain.. Per note, she was treated with CyborD (M spike 3.6 at start)  from 01/2017 to 02/2017. Her treatment was switched to Revlimid Velcade dexamethasone from 02/2017, M spike 1.2 at start,and was found to have progression of disease in 09/2017, when her M spike increased to 1.8, and developed compression fracture in 10/2016. She was restarted on RVD using weekly Velcade.  She was recently seen by Dr. Boris Sharper and recommended to start on a combination of daratumumab, Pomalyst, dexamethasone. Per patient she received her first daratumumab last week with split dose 40m/kg given on 3/14 and 3/15.  Patient reports that she tolerate the treatment well except mild infusion  reaction.  # Patient also reports that she has got dental clearance and has been given 1 dose of Zometa by Dr. VRemus Loffler(unkown dates).  [facility]  # Patient was seen by nurse practitioner last week prior to her cycle 2 daratumumab.  She reports feeling shortness of breath, having cough, believed to have bronchitis and was prescribed Levaquin 500 mg p.o. daily for 7 days, and albuterol as needed.  She also had a VQ scan low probability for pulmonary embolism.  # 01/29/2018 5th weekly treatment of Daratumumab,  Cycle 2 Pomalyst was started for a week and was nterrupted due to acute bronchitis treated with outpatient Azithromycin.    # 02/16/2018 developed right upper extremity swelling and tenderness, confirmed to be DVT. She was taking Aspirin.  Started on Eliquis. Aspirin is stopped.  # 02/16/2018 6th Daratumumab treatment. Was started on Cycle 3 Pomalyst, she has completed about 2 weeks treatment. She was admitted to hospital on 02/21/2018 for Klebsiella UTI and oncology was not consulted. She was treatment with antibiotics and continued on Pomalyst. She was hospitalized again on 03/02/2018 for pneumonia, COPD exacerbation/emphasema and oncology was consulted and patient was seen by my colleague Dr.Finnergan and advise patient to stop Pomalyst.   # [facility] recommends dose reduced Pomalyst 391m This was previously discussed with patient, and she was reluctant to be restarted on Pomalyst. Today she seems to be more acceptable for restarting Pomalyst, but requests to start in a few weeks so that she can  have a nice break.   #Previously declined bone marrow transplant evaluation.    INTERVAL HISTORY Cassie Jones is a 72 y.o. female who has above history reviewed by me presents for assessment prior to antineoplastic immunotherapy and chemotherapy toxicity assessment.  #Patient reports doing  well today. Back pain is stable.  She does not take any pain medication  #Fatigue, chronic #Bilateral hand tremor, reports started after her admission in May and getting worse.  She has resting tremor and tremor get worse in if she tries to reach out.  No aggravating factors or alleviating factors.  Data reviewed:  Multiple myeloma labs obtained at last visit:  03/06/2018 SPEP showed VGPR with >90% reduction of her M protein. Normalized free light chain ratio. 05/04/2018 SPEP continued to show stable M protein, at 0.2g/L,   Current Treatment  # 3/14, 3/15 at outside facility, she got split dose for first treatment of daratumumab. #  01/08/2018 Start on weekly daratumumab 37m/m2, dexamethasone 257m  S/p # Palliative RT to spine. 01/08/2018 started cycle 1 Pomalyst 35m57may 1-21 ogf 28 day cycle.  Pomalyst 4 mg was held after 3 cycles with multiple interruptions due to neutropenia and recurrent pneumonia. Pomalyst was restarted at a lower dose of 3 mg on 04/06/2018 [cycle 4] . This cycle of Pomalyst was  disrreupted for 4 days due to UTI. She was instructed to restart and was able to finish this cycle.   05/11/2018 Cycle 5 Pomalyst delayed one week as she needs to get deep dental clean before obtaining dental clearance for Xgeva.  06/01/2018  Xgeva.  06/08/2018 Cycle 6 Pomalyst 3mg40mCurrent Pain medication: not taking    Review of Systems  Constitutional: Positive for malaise/fatigue. Negative for chills, fever and weight loss.  HENT: Negative for congestion, ear discharge, ear pain, hearing loss, nosebleeds, sinus pain and sore throat.   Eyes: Negative for blurred vision, double vision, photophobia, discharge and redness.  Respiratory: Negative for cough, hemoptysis, sputum production, shortness of breath and wheezing.   Cardiovascular: Negative for chest pain, palpitations, orthopnea, claudication and leg swelling.  Gastrointestinal: Negative for abdominal pain, blood in stool, constipation,  diarrhea, heartburn, melena, nausea and vomiting.  Genitourinary: Negative for dysuria, flank pain, frequency, hematuria and urgency.  Musculoskeletal: Negative for back pain, joint pain, myalgias and neck pain.  Skin: Negative for itching and rash.  Neurological: Positive for headaches. Negative for dizziness, tingling, tremors, sensory change, speech change, focal weakness and weakness.  Endo/Heme/Allergies: Negative for environmental allergies. Does not bruise/bleed easily.  Psychiatric/Behavioral: Negative for depression, hallucinations, substance abuse and suicidal ideas. The patient is not nervous/anxious.     MEDICAL HISTORY:  Past Medical History:  Diagnosis Date  . Ascites   . Asthma   . Bilateral leg edema   . Cancer (HCC)[place]. Chicken pox   . Colon polyps   . Diverticulitis    with perforation 02/2017 and hosp with colostomy bag s/p removal   . Drug-induced neutropenia (HCC)[place]21/2019  . GERD (gastroesophageal reflux disease)   . Hyperlipidemia   . Hypertension   . Multiple myeloma (HCC)[place] dx'ed 01/2018 follows Dr. Ennis Delpozo aTasia Catchings [facility]   . Pleural effusion   . Pneumonia    03/02/18   . Thyroid nodule   . UTI (urinary tract infection)   . Vitamin D deficiency     SURGICAL HISTORY: Past Surgical History:  Procedure Laterality Date  . ABDOMINAL HYSTERECTOMY    . BREAST  BIOPSY Bilateral yrs ago   benign  . CHOLECYSTECTOMY    . COLOSTOMY  2018  . COLOSTOMY REVERSAL     11 2018  . KYPHOPLASTY     11/2017 [place]     SOCIAL HISTORY: Social History   Socioeconomic History  . Marital status: Married    Spouse name: Not on file  . Number of children: Not on file  . Years of education: Not on file  . Highest education level: Not on file  Occupational History  . Not on file  Social Needs  . Financial resource strain: Not hard at all  . Food insecurity:    Worry: Never true    Inability: Never true  . Transportation needs:    Medical: No    Non-medical: No    Tobacco Use  . Smoking status: Former Research scientist (life sciences)  . Smokeless tobacco: Never Used  Substance and Sexual Activity  . Alcohol use: Not Currently  . Drug use: Never  . Sexual activity: Yes    Comment: husband   Lifestyle  . Physical activity:    Days per week: 0 days    Minutes per session: 0 min  . Stress: Not at all  Relationships  . Social connections:    Talks on phone: More than three times a week    Gets together: Once a week    Attends religious service: 1 to 4 times per year    Active member of club or organization: No    Attends meetings of clubs or organizations: Never    Relationship status: Married  . Intimate partner violence:    Fear of current or ex partner: No    Emotionally abused: No    Physically abused: No    Forced sexual activity: No  Other Topics Concern  . Not on file  Social History Narrative   Married    Husband lives in [place] while she gets treatment in this area    She lives alone in this area with family close by     FAMILY HISTORY: Family History  Problem Relation Age of Onset  . Breast cancer Mother 59  . Cancer Mother        breast   . Diabetes Mother   . Heart disease Mother   . Hyperlipidemia Mother   . Hypertension Mother   . Birth defects Brother   . Diabetes Brother   . Heart disease Brother   . Hyperlipidemia Brother   . Cancer Father        prostate met to liver     ALLERGIES:  is allergic to codeine.  MEDICATIONS:  Current Outpatient Medications  Medication Sig Dispense Refill  . acetaminophen (TYLENOL) 500 MG tablet Take 1,000 mg by mouth every 6 (six) hours as needed for mild pain or fever.    Marland Kitchen albuterol (VENTOLIN HFA) 108 (90 Base) MCG/ACT inhaler Inhale 2 puffs into the lungs every 6 (six) hours as needed. 1 Inhaler 1  . apixaban (ELIQUIS) 5 MG TABS tablet Take 1 tablet (5 mg total) by mouth 2 (two) times daily. 60 tablet 3  . calcium-vitamin D (OSCAL WITH D) 500-200 MG-UNIT tablet Take 1 tablet by mouth daily.  30 tablet 0  . Cholecalciferol 50000 units capsule Take 1 capsule (50,000 Units total) by mouth once a week. 13 capsule 1  . feeding supplement, ENSURE ENLIVE, (ENSURE ENLIVE) LIQD Take 237 mLs by mouth 3 (three) times daily between meals. 237 mL 12  . fluticasone (  FLONASE) 50 MCG/ACT nasal spray Place 1-2 sprays into both nostrils daily. (Patient taking differently: Place 1-2 sprays into both nostrils daily as needed. ) 16 g 2  . fluticasone (FLOVENT DISKUS) 50 MCG/BLIST diskus inhaler Inhale 1 puff into the lungs 2 (two) times daily. (Patient taking differently: Inhale 1 puff into the lungs 2 (two) times daily as needed. ) 1 Inhaler 3  . furosemide (LASIX) 20 MG tablet Take 1 tablet (20 mg total) by mouth daily as needed. 30 tablet 2  . loperamide (IMODIUM A-D) 2 MG tablet Take 2 mg by mouth 4 (four) times daily as needed for diarrhea or loose stools.    . montelukast (SINGULAIR) 10 MG tablet Take 10 mg by mouth at bedtime.    . ondansetron (ZOFRAN) 8 MG tablet Take 8 mg by mouth every 8 (eight) hours as needed for nausea.    . oxyCODONE (OXY IR/ROXICODONE) 5 MG immediate release tablet Take 5 mg by mouth 3 (three) times daily as needed for moderate pain.    . pantoprazole (PROTONIX) 40 MG tablet Take 1 tablet by mouth every morning.    . pomalidomide (POMALYST) 3 MG capsule Take 1 capsule (3 mg total) by mouth daily. Take with water on days 1-21. Repeat every 28 days. 21 capsule 0  . potassium chloride SA (K-DUR,KLOR-CON) 20 MEQ tablet Take 1 tablet (20 mEq total) by mouth daily. 30 tablet 2  . pravastatin (PRAVACHOL) 10 MG tablet Take 1 tablet by mouth at bedtime.    . prochlorperazine (COMPAZINE) 10 MG tablet Take 10 mg by mouth every 6 (six) hours as needed for nausea.    . tiotropium (SPIRIVA) 18 MCG inhalation capsule Place 1 capsule (18 mcg total) into inhaler and inhale daily. 30 capsule 6   No current facility-administered medications for this visit.    Facility-Administered Medications  Ordered in Other Visits  Medication Dose Route Frequency Provider Last Rate Last Dose  . montelukast (SINGULAIR) tablet 10 mg  10 mg Oral QHS Yu, Zhou, MD   10 mg at 04/20/18 1029     PHYSICAL EXAMINATION: ECOG PERFORMANCE STATUS: 1 - Symptomatic but completely ambulatory Vitals:   06/15/18 0905  BP: 130/70  Pulse: 60  Resp: 18  Temp: (!) 96.6 F (35.9 C)   Filed Weights   06/15/18 0905  Weight: 140 lb 12.8 oz (63.9 kg)    Physical Exam  Constitutional: She is oriented to person, place, and time and well-developed, well-nourished, and in no distress. No distress.  HENT:  Head: Normocephalic and atraumatic.  Nose: Nose normal.  Mouth/Throat: Oropharynx is clear and moist. No oropharyngeal exudate.  Eyes: Pupils are equal, round, and reactive to light. EOM are normal. Left eye exhibits no discharge. No scleral icterus.  Neck: Normal range of motion. Neck supple. No JVD present. No tracheal deviation present.  Cardiovascular: Normal rate, regular rhythm and normal heart sounds.  No murmur heard. Pulmonary/Chest: Effort normal and breath sounds normal. No respiratory distress. She has no wheezes. She has no rales. She exhibits no tenderness.  Decreased breath sounds bilaterally.   Abdominal: Soft. She exhibits no distension and no mass. There is no tenderness. There is no rebound and no guarding.  Musculoskeletal: Normal range of motion. She exhibits no edema or tenderness.  Hand tremor  Lymphadenopathy:    She has no cervical adenopathy.  Neurological: She is alert and oriented to person, place, and time. No cranial nerve deficit. She exhibits normal muscle tone. Coordination normal.  Skin:   Skin is warm and dry. No rash noted. She is not diaphoretic. No erythema. No pallor.  Psychiatric: Memory, affect and judgment normal.     LABORATORY DATA:  I have reviewed the data as listed Lab Results  Component Value Date   WBC 4.3 06/07/2018   HGB 11.9 (L) 06/07/2018   HCT 35.9  06/07/2018   MCV 88.5 06/07/2018   PLT 185 06/07/2018   Recent Labs    05/04/18 0824 05/18/18 0825 06/01/18 0810  NA 141 140 140  K 4.5 3.5 4.4  CL 109 106 105  CO2 24 25 25  GLUCOSE 105* 103* 108*  BUN 16 12 17  CREATININE 0.76 0.72 0.82  CALCIUM 10.1 9.3 9.5  GFRNONAA >60 >60 >60  GFRAA >60 >60 >60  PROT 6.5 6.3* 6.9  ALBUMIN 3.6 3.4* 3.9  AST 15 16 17  ALT 17 15 19  ALKPHOS 66 57 63  BILITOT 0.8 0.8 1.0       ASSESSMENT & PLAN:  1. Multiple myeloma in relapse (HCC)   2. Drug-induced neutropenia (HCC)   3. Bone metastasis (HCC)   4. Encounter for antineoplastic immunotherapy    #Labs reviewed and discussed with patient.  She has started cycle 6 Pomalyst 3 mg 1 week ago.  Tolerating well Counts acceptable to proceed with daratumumab treatment today.  #Drug-induced neutropenia, ANC is normal today.  She got Granix on 06/02/2018 which was after she finish last cycle.  Multiple myeloma panel showed stable disease.  M protein 0.2, IgG 550.   #Bone metastasis, .  Xgeva 06/01/2018..  Advised patient to take calcium with vitamin D supplements daily. # DVT, continue eliquis.  #Hand trauma, clinically consistent with essential tremor.  Discussed with patient that this is a benign condition and no treatment is needed unless it is severely affecting her daily life.  All questions were answered. The patient knows to call the clinic with any problems questions or concerns.  Return of visit: 2 weeks.   Total face to face encounter time for this patient visit was 25 min. >50% of the time was  spent in counseling and coordination of care. .   Zhou Yu, MD, PhD Hematology Oncology [facility] Pager- [phone] 06/15/2018 

## 2018-06-24 ENCOUNTER — Other Ambulatory Visit: Payer: Self-pay | Admitting: *Deleted

## 2018-06-24 MED ORDER — POMALIDOMIDE 3 MG PO CAPS: 3.0000 mg | ORAL_CAPSULE | Freq: Every day | ORAL | 0 refills | Status: DC

## 2018-06-28 ENCOUNTER — Other Ambulatory Visit: Payer: Self-pay | Admitting: Oncology

## 2018-06-29 ENCOUNTER — Inpatient Hospital Stay: Payer: Medicare PPO | Attending: Oncology

## 2018-06-29 ENCOUNTER — Other Ambulatory Visit: Payer: Self-pay | Admitting: Internal Medicine

## 2018-06-29 ENCOUNTER — Inpatient Hospital Stay (HOSPITAL_BASED_OUTPATIENT_CLINIC_OR_DEPARTMENT_OTHER): Payer: Medicare PPO | Admitting: Oncology

## 2018-06-29 ENCOUNTER — Inpatient Hospital Stay: Payer: Medicare PPO

## 2018-06-29 ENCOUNTER — Encounter: Payer: Self-pay | Admitting: Oncology

## 2018-06-29 ENCOUNTER — Telehealth: Payer: Self-pay | Admitting: Internal Medicine

## 2018-06-29 ENCOUNTER — Other Ambulatory Visit: Payer: Self-pay

## 2018-06-29 VITALS — BP 141/73 | HR 51 | Temp 96.1°F | Resp 16 | Wt 141.6 lb

## 2018-06-29 DIAGNOSIS — D696 Thrombocytopenia, unspecified: Secondary | ICD-10-CM

## 2018-06-29 DIAGNOSIS — E876 Hypokalemia: Secondary | ICD-10-CM | POA: Insufficient documentation

## 2018-06-29 DIAGNOSIS — C9002 Multiple myeloma in relapse: Secondary | ICD-10-CM

## 2018-06-29 DIAGNOSIS — G250 Essential tremor: Secondary | ICD-10-CM | POA: Diagnosis not present

## 2018-06-29 DIAGNOSIS — N390 Urinary tract infection, site not specified: Secondary | ICD-10-CM | POA: Diagnosis not present

## 2018-06-29 DIAGNOSIS — J449 Chronic obstructive pulmonary disease, unspecified: Secondary | ICD-10-CM | POA: Diagnosis not present

## 2018-06-29 DIAGNOSIS — Z7901 Long term (current) use of anticoagulants: Secondary | ICD-10-CM | POA: Diagnosis not present

## 2018-06-29 DIAGNOSIS — M545 Low back pain, unspecified: Secondary | ICD-10-CM

## 2018-06-29 DIAGNOSIS — D701 Agranulocytosis secondary to cancer chemotherapy: Secondary | ICD-10-CM | POA: Diagnosis not present

## 2018-06-29 DIAGNOSIS — C7951 Secondary malignant neoplasm of bone: Secondary | ICD-10-CM | POA: Insufficient documentation

## 2018-06-29 DIAGNOSIS — R5381 Other malaise: Secondary | ICD-10-CM

## 2018-06-29 DIAGNOSIS — T451X5S Adverse effect of antineoplastic and immunosuppressive drugs, sequela: Secondary | ICD-10-CM | POA: Insufficient documentation

## 2018-06-29 DIAGNOSIS — Z87440 Personal history of urinary (tract) infections: Secondary | ICD-10-CM

## 2018-06-29 DIAGNOSIS — R5382 Chronic fatigue, unspecified: Secondary | ICD-10-CM

## 2018-06-29 DIAGNOSIS — D702 Other drug-induced agranulocytosis: Secondary | ICD-10-CM

## 2018-06-29 DIAGNOSIS — I10 Essential (primary) hypertension: Secondary | ICD-10-CM | POA: Insufficient documentation

## 2018-06-29 DIAGNOSIS — K219 Gastro-esophageal reflux disease without esophagitis: Secondary | ICD-10-CM

## 2018-06-29 DIAGNOSIS — E785 Hyperlipidemia, unspecified: Secondary | ICD-10-CM

## 2018-06-29 DIAGNOSIS — Z5112 Encounter for antineoplastic immunotherapy: Secondary | ICD-10-CM

## 2018-06-29 DIAGNOSIS — Z5111 Encounter for antineoplastic chemotherapy: Secondary | ICD-10-CM

## 2018-06-29 DIAGNOSIS — Z79899 Other long term (current) drug therapy: Secondary | ICD-10-CM | POA: Insufficient documentation

## 2018-06-29 DIAGNOSIS — Z87891 Personal history of nicotine dependence: Secondary | ICD-10-CM | POA: Insufficient documentation

## 2018-06-29 LAB — CBC WITH DIFFERENTIAL/PLATELET
BASOS PCT: 3 %
Basophils Absolute: 0.1 10*3/uL (ref 0–0.1)
EOS ABS: 0.4 10*3/uL (ref 0–0.7)
EOS PCT: 11 %
HCT: 36.9 % (ref 35.0–47.0)
HEMOGLOBIN: 12.2 g/dL (ref 12.0–16.0)
LYMPHS ABS: 0.9 10*3/uL — AB (ref 1.0–3.6)
Lymphocytes Relative: 23 %
MCH: 29.1 pg (ref 26.0–34.0)
MCHC: 33.2 g/dL (ref 32.0–36.0)
MCV: 87.7 fL (ref 80.0–100.0)
Monocytes Absolute: 1.2 10*3/uL — ABNORMAL HIGH (ref 0.2–0.9)
Monocytes Relative: 30 %
NEUTROS PCT: 33 %
Neutro Abs: 1.3 10*3/uL — ABNORMAL LOW (ref 1.4–6.5)
PLATELETS: 134 10*3/uL — AB (ref 150–440)
RBC: 4.2 MIL/uL (ref 3.80–5.20)
RDW: 14.8 % — ABNORMAL HIGH (ref 11.5–14.5)
WBC: 4 10*3/uL (ref 3.6–11.0)

## 2018-06-29 LAB — COMPREHENSIVE METABOLIC PANEL
ALK PHOS: 51 U/L (ref 38–126)
ALT: 19 U/L (ref 0–44)
AST: 17 U/L (ref 15–41)
Albumin: 3.7 g/dL (ref 3.5–5.0)
Anion gap: 7 (ref 5–15)
BILIRUBIN TOTAL: 1.1 mg/dL (ref 0.3–1.2)
BUN: 11 mg/dL (ref 8–23)
CALCIUM: 9 mg/dL (ref 8.9–10.3)
CO2: 25 mmol/L (ref 22–32)
CREATININE: 0.74 mg/dL (ref 0.44–1.00)
Chloride: 108 mmol/L (ref 98–111)
Glucose, Bld: 109 mg/dL — ABNORMAL HIGH (ref 70–99)
Potassium: 3.7 mmol/L (ref 3.5–5.1)
Sodium: 140 mmol/L (ref 135–145)
Total Protein: 6.4 g/dL — ABNORMAL LOW (ref 6.5–8.1)

## 2018-06-29 MED ORDER — OXYCODONE HCL 5 MG PO TABS: 5.0000 mg | ORAL_TABLET | Freq: Three times a day (TID) | ORAL | 0 refills | Status: AC | PRN

## 2018-06-29 MED ORDER — SODIUM CHLORIDE 0.9 % IV SOLN
1000.0000 mg | Freq: Once | INTRAVENOUS | Status: AC
  Administered 2018-06-29: 1000 mg via INTRAVENOUS
  Filled 2018-06-29: qty 40

## 2018-06-29 MED ORDER — ONDANSETRON HCL 8 MG PO TABS: 8.0000 mg | ORAL_TABLET | Freq: Three times a day (TID) | ORAL | 0 refills | Status: DC | PRN

## 2018-06-29 MED ORDER — ACETAMINOPHEN 325 MG PO TABS
650.0000 mg | ORAL_TABLET | Freq: Once | ORAL | Status: AC
  Administered 2018-06-29: 650 mg via ORAL
  Filled 2018-06-29: qty 2

## 2018-06-29 MED ORDER — SODIUM CHLORIDE 0.9 % IV SOLN
Freq: Once | INTRAVENOUS | Status: AC
  Administered 2018-06-29: 10:00:00 via INTRAVENOUS
  Filled 2018-06-29: qty 250

## 2018-06-29 MED ORDER — POMALIDOMIDE 3 MG PO CAPS: 3.0000 mg | ORAL_CAPSULE | Freq: Every day | ORAL | 0 refills | Status: DC

## 2018-06-29 MED ORDER — HEPARIN SOD (PORK) LOCK FLUSH 100 UNIT/ML IV SOLN: 500.0000 [IU] | Freq: Once | INTRAVENOUS | Status: DC

## 2018-06-29 MED ORDER — DEXAMETHASONE 4 MG PO TABS: 20.0000 mg | ORAL_TABLET | ORAL | 0 refills | Status: DC

## 2018-06-29 MED ORDER — PROCHLORPERAZINE MALEATE 10 MG PO TABS
10.0000 mg | ORAL_TABLET | Freq: Once | ORAL | Status: AC
  Administered 2018-06-29: 10 mg via ORAL
  Filled 2018-06-29: qty 1

## 2018-06-29 MED ORDER — SODIUM CHLORIDE 0.9 % IV SOLN
20.0000 mg | Freq: Once | INTRAVENOUS | Status: AC
  Administered 2018-06-29: 20 mg via INTRAVENOUS
  Filled 2018-06-29: qty 2

## 2018-06-29 MED ORDER — DENOSUMAB 120 MG/1.7ML ~~LOC~~ SOLN
120.0000 mg | Freq: Once | SUBCUTANEOUS | Status: AC
  Administered 2018-06-29: 120 mg via SUBCUTANEOUS
  Filled 2018-06-29: qty 1.7

## 2018-06-29 MED ORDER — SODIUM CHLORIDE 0.9% FLUSH
10.0000 mL | Freq: Once | INTRAVENOUS | Status: DC
  Filled 2018-06-29: qty 10

## 2018-06-29 MED ORDER — DIPHENHYDRAMINE HCL 25 MG PO CAPS
50.0000 mg | ORAL_CAPSULE | Freq: Once | ORAL | Status: AC
  Administered 2018-06-29: 50 mg via ORAL
  Filled 2018-06-29: qty 2

## 2018-06-29 MED ORDER — PANTOPRAZOLE SODIUM 40 MG PO TBEC: 40.0000 mg | DELAYED_RELEASE_TABLET | ORAL | 1 refills | Status: AC

## 2018-06-29 NOTE — Progress Notes (Signed)
Patient here today for follow up and chemotherapy.  

## 2018-06-29 NOTE — Telephone Encounter (Signed)
This was prescribed by a historical provider will she refill?

## 2018-06-29 NOTE — Progress Notes (Signed)
Hematology/Oncology Follow up note [facility] [phone] [phone]   Patient Care Team: McLean-Scocuzza, Nino Glow, MD as PCP - General (Internal Medicine)  REFERRING PROVIDER: Dr. Baruch Gouty, Donette  REASON FOR VISIT Follow up for treatment of multiple myeloma.   HISTORY OF PRESENTING ILLNESS:  Cassie Jones is a  72 y.o.  female with PMH listed below who was referred to me for evaluation of multiple myeloma.  Patient used to follow-up with local oncologist Dr. Tonia Brooms and [facility] oncologist Dr. Amalia Hailey.  Patient tells me that as her husband works every day and has a busy working schedule, she is not able to get transportation to her treatment.  As an alternative she is now living with her brother who is close to [facility] [place] and want to transfer care here. Extensive medical record review was performed.    Patient was diagnosed with IgG lambda, ISS2, t (4,14) multiple myeloma on February 03, 2017, she has hemoglobin of 9, calcium 8.9, LDH 132, beta microglobulin 4.6, albumin 3.2.  Skeletal survey negative.  PET scan with single nonspecific humeral lesion and a thyroid nodule.  Marrow with 60% plasmacytosis by CD138 IHC, FISH with hyperdiploidy t(4, 14), 1 q. gain.. Per note, she was treated with CyborD (M spike 3.6 at start)  from 01/2017 to 02/2017. Her treatment was switched to Revlimid Velcade dexamethasone from 02/2017, M spike 1.2 at start,and was found to have progression of disease in 09/2017, when her M spike increased to 1.8, and developed compression fracture in 10/2016. She was restarted on RVD using weekly Velcade.  She was recently seen by Dr. Boris Sharper and recommended to start on a combination of daratumumab, Pomalyst, dexamethasone. Per patient she received her first daratumumab last week with split dose 40m/kg given on 3/14 and 3/15.  Patient reports that she tolerate the treatment well except mild infusion  reaction.  # Patient also reports that she has got dental clearance and has been given 1 dose of Zometa by Dr. VRemus Loffler(unkown dates).  [facility]  # Patient was seen by nurse practitioner last week prior to her cycle 2 daratumumab.  She reports feeling shortness of breath, having cough, believed to have bronchitis and was prescribed Levaquin 500 mg p.o. daily for 7 days, and albuterol as needed.  She also had a VQ scan low probability for pulmonary embolism.  # 01/29/2018 5th weekly treatment of Daratumumab,  Cycle 2 Pomalyst was started for a week and was nterrupted due to acute bronchitis treated with outpatient Azithromycin.    # 02/16/2018 developed right upper extremity swelling and tenderness, confirmed to be DVT. She was taking Aspirin.  Started on Eliquis. Aspirin is stopped.  # 02/16/2018 6th Daratumumab treatment. Was started on Cycle 3 Pomalyst, she has completed about 2 weeks treatment. She was admitted to hospital on 02/21/2018 for Klebsiella UTI and oncology was not consulted. She was treatment with antibiotics and continued on Pomalyst. She was hospitalized again on 03/02/2018 for pneumonia, COPD exacerbation/emphasema and oncology was consulted and patient was seen by my colleague Dr.Finnergan and advise patient to stop Pomalyst.   # [facility] recommends dose reduced Pomalyst 391m This was previously discussed with patient, and she was reluctant to be restarted on Pomalyst. Today she seems to be more acceptable for restarting Pomalyst, but requests to start in a few weeks so that she can  have a nice break.   #Previously declined bone marrow transplant evaluation.    INTERVAL HISTORY Cassie Jones is a 72 y.o. female who has above history reviewed by me presents for assessment prior to antineoplastic immunotherapy and chemotherapy toxicity assessment.   # Back pain, she  reports recently starting to have aching lower back pain, bilateral, no aggravating factor. She tried Tylenol which did not help. She started to take Oxycondone 63m as needed. Requests a refill.  Not associated with saddle numbness, incontinence.   #Fatigue, chronic stable at baseline.  # Essential tremor, stable.  No aggravating factors or alleviating factors. # Drug induced neutropenia, Denies any fever or chills, no urinary symptoms. She got one dose of granix at then end of cycle 5 pomalyst. She finished cycle 6 Pomalyst yesterday.   Data reviewed:  Multiple myeloma labs obtained at last visit:  03/06/2018 SPEP showed VGPR with >90% reduction of her M protein. Normalized free light chain ratio. 05/04/2018 SPEP continued to show stable M protein, at 0.2g/L,   Current Treatment  # 3/14, 3/15 at outside facility, she got split dose for first treatment of daratumumab. #  01/08/2018 Start on weekly daratumumab 124mm2, dexamethasone 2054m S/p # Palliative RT to spine. 01/08/2018 started cycle 1 Pomalyst 4mg38my 1-21 ogf 28 day cycle.  Pomalyst 4 mg was held after 3 cycles with multiple interruptions due to neutropenia and recurrent pneumonia. Pomalyst was restarted at a lower dose of 3 mg on 04/06/2018 [cycle 4] . This cycle of Pomalyst was  disrreupted for 4 days due to UTI. She was instructed to restart and was able to finish this cycle.   05/11/2018 Cycle 5 Pomalyst delayed one week as she needs to get deep dental clean before obtaining dental clearance for Xgeva.  06/01/2018  Xgeva.  06/08/2018 Cycle 6 Pomalyst 3mg 64murrent Pain medication: started to take oxycodone 5mg P61m Review of Systems  Constitutional: Positive for malaise/fatigue. Negative for chills, fever and weight loss.  HENT: Negative for congestion, ear discharge, ear pain, hearing loss, nosebleeds, sinus pain and sore throat.   Eyes: Negative for blurred vision, double vision, photophobia, discharge and redness.    Respiratory: Negative for cough, hemoptysis, sputum production, shortness of breath and wheezing.   Cardiovascular: Negative for chest pain, palpitations, orthopnea, claudication and leg swelling.  Gastrointestinal: Negative for abdominal pain, blood in stool, constipation, diarrhea, heartburn, melena, nausea and vomiting.  Genitourinary: Negative for dysuria, flank pain, frequency, hematuria and urgency.  Musculoskeletal: Positive for back pain. Negative for joint pain, myalgias and neck pain.  Skin: Negative for itching and rash.  Neurological: Negative for dizziness, tingling, tremors, sensory change, speech change, focal weakness, weakness and headaches.  Endo/Heme/Allergies: Negative for environmental allergies. Does not bruise/bleed easily.  Psychiatric/Behavioral: Negative for depression, hallucinations, substance abuse and suicidal ideas. The patient is not nervous/anxious.     MEDICAL HISTORY:  Past Medical History:  Diagnosis Date  . Ascites   . Asthma   . Bilateral leg edema   . Cancer ([place]CC)  [place]   . Colon polyps   . Diverticulitis    with perforation 02/2017 and hosp with colostomy bag s/p removal   . Drug-induced neutropenia (HCC) [place]  . GERD (gastroesophageal reflux disease)   . Hyperlipidemia   . Hypertension   . Multiple myeloma (HCC)  [place] 01/2018 [facility] . Pleural effusion   . Pneumonia  03/02/18   . Thyroid nodule   . UTI (urinary tract infection)   . Vitamin D deficiency     SURGICAL HISTORY: Past Surgical History:  Procedure Laterality Date  . ABDOMINAL HYSTERECTOMY    . BREAST BIOPSY Bilateral yrs ago   benign  . CHOLECYSTECTOMY    . COLOSTOMY  2018  . COLOSTOMY REVERSAL     11 2018  . KYPHOPLASTY     11/2017 [place]     SOCIAL HISTORY: Social History   Socioeconomic History  . Marital status: Married    Spouse name: Not on file  . Number of children: Not on file  . Years of education: Not on  file  . Highest education level: Not on file  Occupational History  . Not on file  Social Needs  . Financial resource strain: Not hard at all  . Food insecurity:    Worry: Never true    Inability: Never true  . Transportation needs:    Medical: No    Non-medical: No  Tobacco Use  . Smoking status: Former Research scientist (life sciences)  . Smokeless tobacco: Never Used  Substance and Sexual Activity  . Alcohol use: Not Currently  . Drug use: Never  . Sexual activity: Yes    Comment: husband   Lifestyle  . Physical activity:    Days per week: 0 days    Minutes per session: 0 min  . Stress: Not at all  Relationships  . Social connections:    Talks on phone: More than three times a week    Gets together: Once a week    Attends religious service: 1 to 4 times per year    Active member of club or organization: No    Attends meetings of clubs or organizations: Never    Relationship status: Married  . Intimate partner violence:    Fear of current or ex partner: No    Emotionally abused: No    Physically abused: No    Forced sexual activity: No  Other Topics Concern  . Not on file  Social History Narrative   Married    Husband lives in [place] while she gets treatment in this area    She lives alone in this area with family close by     FAMILY HISTORY: Family History  Problem Relation Age of Onset  . Breast cancer Mother 59  . Cancer Mother        breast   . Diabetes Mother   . Heart disease Mother   . Hyperlipidemia Mother   . Hypertension Mother   . Birth defects Brother   . Diabetes Brother   . Heart disease Brother   . Hyperlipidemia Brother   . Cancer Father        prostate met to liver     ALLERGIES:  is allergic to codeine.  MEDICATIONS:  Current Outpatient Medications  Medication Sig Dispense Refill  . acetaminophen (TYLENOL) 500 MG tablet Take 1,000 mg by mouth every 6 (six) hours as needed for mild pain or fever.    Marland Kitchen albuterol (VENTOLIN HFA) 108 (90 Base) MCG/ACT  inhaler Inhale 2 puffs into the lungs every 6 (six) hours as needed. 1 Inhaler 1  . apixaban (ELIQUIS) 5 MG TABS tablet Take 1 tablet (5 mg total) by mouth 2 (two) times daily. 60 tablet 3  . calcium-vitamin D (OSCAL WITH D) 500-200 MG-UNIT tablet Take 1 tablet by mouth daily. 30 tablet 0  . Cholecalciferol 50000 units capsule  Take 1 capsule (50,000 Units total) by mouth once a week. 13 capsule 1  . feeding supplement, ENSURE ENLIVE, (ENSURE ENLIVE) LIQD Take 237 mLs by mouth 3 (three) times daily between meals. 237 mL 12  . fluticasone (FLONASE) 50 MCG/ACT nasal spray Place 1-2 sprays into both nostrils daily. (Patient taking differently: Place 1-2 sprays into both nostrils daily as needed. ) 16 g 2  . fluticasone (FLOVENT DISKUS) 50 MCG/BLIST diskus inhaler Inhale 1 puff into the lungs 2 (two) times daily. (Patient taking differently: Inhale 1 puff into the lungs 2 (two) times daily as needed. ) 1 Inhaler 3  . furosemide (LASIX) 20 MG tablet Take 1 tablet (20 mg total) by mouth daily as needed. 30 tablet 2  . loperamide (IMODIUM A-D) 2 MG tablet Take 2 mg by mouth 4 (four) times daily as needed for diarrhea or loose stools.    . montelukast (SINGULAIR) 10 MG tablet Take 10 mg by mouth at bedtime.    . ondansetron (ZOFRAN) 8 MG tablet Take 1 tablet (8 mg total) by mouth every 8 (eight) hours as needed for nausea. 30 tablet 0  . oxyCODONE (OXY IR/ROXICODONE) 5 MG immediate release tablet Take 1 tablet (5 mg total) by mouth every 8 (eight) hours as needed for moderate pain. 30 tablet 0  . pantoprazole (PROTONIX) 40 MG tablet Take 1 tablet by mouth every morning.    . pomalidomide (POMALYST) 3 MG capsule Take 1 capsule (3 mg total) by mouth daily. Take with water on days 1-21. Repeat every 28 days. 21 capsule 0  . potassium chloride SA (K-DUR,KLOR-CON) 20 MEQ tablet Take 1 tablet (20 mEq total) by mouth daily. 30 tablet 2  . pravastatin (PRAVACHOL) 10 MG tablet Take 1 tablet by mouth at bedtime.    .  prochlorperazine (COMPAZINE) 10 MG tablet Take 10 mg by mouth every 6 (six) hours as needed for nausea.    Marland Kitchen tiotropium (SPIRIVA) 18 MCG inhalation capsule Place 1 capsule (18 mcg total) into inhaler and inhale daily. 30 capsule 6  . dexamethasone (DECADRON) 4 MG tablet Take 5 tablets (20 mg total) by mouth once a week. 30 tablet 0   No current facility-administered medications for this visit.    Facility-Administered Medications Ordered in Other Visits  Medication Dose Route Frequency Provider Last Rate Last Dose  . daratumumab (DARZALEX) 1,000 mg in sodium chloride 0.9 % 450 mL chemo infusion  1,000 mg Intravenous Once Earlie Server, MD      . montelukast (SINGULAIR) tablet 10 mg  10 mg Oral Anthoney Harada, MD   10 mg at 04/20/18 1029     PHYSICAL EXAMINATION: ECOG PERFORMANCE STATUS: 1 - Symptomatic but completely ambulatory Vitals:   06/29/18 0945  BP: (!) 141/73  Pulse: (!) 51  Resp: 16  Temp: (!) 96.1 F (35.6 C)   Filed Weights   06/29/18 0945  Weight: 141 lb 9 oz (64.2 kg)    Physical Exam  Constitutional: She is oriented to person, place, and time and well-developed, well-nourished, and in no distress. No distress.  HENT:  Head: Normocephalic and atraumatic.  Nose: Nose normal.  Mouth/Throat: Oropharynx is clear and moist. No oropharyngeal exudate.  Eyes: Pupils are equal, round, and reactive to light. EOM are normal. Left eye exhibits no discharge. No scleral icterus.  Neck: Normal range of motion. Neck supple. No JVD present. No tracheal deviation present.  Cardiovascular: Normal rate, regular rhythm and normal heart sounds.  No murmur heard. Pulmonary/Chest:  Effort normal and breath sounds normal. No respiratory distress. She has no wheezes. She has no rales. She exhibits no tenderness.  Decreased breath sounds bilaterally.   Abdominal: Soft. She exhibits no distension and no mass. There is no tenderness. There is no rebound and no guarding.  Musculoskeletal: Normal  range of motion. She exhibits no edema or tenderness.  Hand tremor  Lymphadenopathy:    She has no cervical adenopathy.  Neurological: She is alert and oriented to person, place, and time. No cranial nerve deficit. She exhibits normal muscle tone. Coordination normal.  Skin: Skin is warm and dry. No rash noted. She is not diaphoretic. No erythema. No pallor.  Psychiatric: Memory, affect and judgment normal.     LABORATORY DATA:  I have reviewed the data as listed Lab Results  Component Value Date   WBC 4.0 06/29/2018   HGB 12.2 06/29/2018   HCT 36.9 06/29/2018   MCV 87.7 06/29/2018   PLT 134 (L) 06/29/2018   Recent Labs    06/01/18 0810 06/15/18 0850 06/29/18 0904  NA 140 140 140  K 4.4 3.7 3.7  CL 105 108 108  CO2 _0 GLUCOSE 108* 106* 109*  BUN _1 CREATININE 0.82 0.72 0.74  CALCIUM 9.5 8.6* 9.0  GFRNONAA >60 >60 >60  GFRAA >60 >60 >60  PROT 6.9 6.4* 6.4*  ALBUMIN 3.9 3.6 3.7  AST _2 ALT _3 ALKPHOS 63 58 51  BILITOT 1.0 0.8 1.1       ASSESSMENT & PLAN:  1. Multiple myeloma in relapse ([place])   2. Bone metastasis ([place])   3. Acute bilateral low back pain without sciatica   4. Drug-induced neutropenia (HCC)   5. Encounter for antineoplastic immunotherapy   6. Encounter for antineoplastic chemotherapy   7. Thrombocytopenia ([place])   8. Essential tremor    #Multiple myeloma:  Labs reviewed and discussed with patient. Counts are acceptable and proceed with today's daratumumab. She has finished course of Q 2weeks of Daratumumab and will schedule her to go on Every 4 weeks of Daratumumab. Prescribed her oral Dexamethasone 67m weekly. [ she does not need to take on the days of Daratumumab as she gets IV dex].  Multiple myeloma panel showed stable disease.  M protein 0.2, IgG 550. Check panel at next visit.   #Drug-induced neutropenia, ANC 1.3 hold Granix today. Her counts should be good to start cycle 7 of Pomalyst 364mnext week.  #Bone  metastasis, Xgeva 06/01/2018.. Marland KitchenProceed with Xgeva today.  # DVT, on Pomalyst, continue Eliquis.  # Back pain, worsened. Will proceed with bone scan for further evaluation. Refilled oxycodone 71m32m6h as needed.  # Thrombocytopenia, stable.  # Tremor stable.   We spent sufficient time to discuss many aspect of care, questions were answered to patient's satisfaction. The patient knows to call the clinic with any problems questions or concerns. Orders Placed This Encounter  Procedures  . NM Bone Scan Whole Body    Standing Status:   Future    Standing Expiration Date:   06/29/2019    Order Specific Question:   ** REASON FOR EXAM (FREE TEXT)    Answer:   multiple myeloma, worsened lower back pain    Order Specific Question:   If indicated for the ordered procedure, I authorize the administration of a radiopharmaceutical per Radiology protocol    Answer:   Yes    Order Specific Question:   Preferred  imaging location?    Answer:   [facility]    Order Specific Question:   Radiology Contrast Protocol - do NOT remove file path    Answer:   _0 [email or web address]  . Multiple Myeloma Panel (SPEP&IFE w/QIG)    Standing Status:   Future    Standing Expiration Date:   06/29/2019  . Kappa/lambda light chains    Standing Status:   Future    Standing Expiration Date:   06/29/2019    Return of visit: 4 weeks for assessment prior to daratumumab.    Earlie Server, MD, PhD Hematology Oncology [facility] Pager- [phone] 06/29/2018

## 2018-06-29 NOTE — Telephone Encounter (Signed)
Copied from [place] [id number]. Topic: Quick Communication - Rx Refill/Question >> Jun 29, 2018 10:32 AM Alfredia Ferguson R wrote: Medication: pantoprazole (PROTONIX) 40 MG tablet  Has the patient contacted their pharmacy? Yes  Preferred Pharmacy (with phone number or street name): [facility] ([facility]),  - [place] [phone] (Phone) [phone] (Fax)    Agent: Please be advised that RX refills may take up to 3 business days. We ask that you follow-up with your pharmacy.

## 2018-07-09 ENCOUNTER — Encounter
Admission: RE | Admit: 2018-07-09 | Discharge: 2018-07-09 | Disposition: A | Discharge status: Home / Self Care | Payer: Medicare PPO | Source: Ambulatory Visit | Attending: Oncology | Admitting: Oncology

## 2018-07-09 DIAGNOSIS — M545 Low back pain, unspecified: Secondary | ICD-10-CM

## 2018-07-09 MED ORDER — TECHNETIUM TC 99M MEDRONATE IV KIT
20.0000 | PACK | Freq: Once | INTRAVENOUS | Status: AC | PRN
  Administered 2018-07-09: 22.77 via INTRAVENOUS

## 2018-07-13 ENCOUNTER — Ambulatory Visit: Payer: Medicare PPO | Admitting: Internal Medicine

## 2018-07-13 ENCOUNTER — Encounter: Payer: Self-pay | Admitting: Internal Medicine

## 2018-07-13 VITALS — BP 140/86 | HR 66 | Temp 97.9°F | Ht 62.0 in | Wt 141.6 lb

## 2018-07-13 DIAGNOSIS — R300 Dysuria: Secondary | ICD-10-CM

## 2018-07-13 DIAGNOSIS — N3001 Acute cystitis with hematuria: Secondary | ICD-10-CM | POA: Diagnosis not present

## 2018-07-13 MED ORDER — AMOXICILLIN-POT CLAVULANATE 875-125 MG PO TABS: 1.0000 | ORAL_TABLET | Freq: Two times a day (BID) | ORAL | 0 refills | Status: DC

## 2018-07-13 NOTE — Progress Notes (Signed)
Chief Complaint  Patient presents with  . Urinary Tract Infection   F/u  C/o dysuria, increased freq low abdominal pressure and right flank pain new x 1-2 days. She thinks she has UTI  Review of Systems  Constitutional: Negative for weight loss.  HENT: Negative for hearing loss.   Eyes: Negative for blurred vision.  Respiratory: Negative for shortness of breath.   Cardiovascular: Negative for chest pain.  Gastrointestinal: Positive for abdominal pain.  Genitourinary: Positive for dysuria and frequency.  Musculoskeletal: Positive for joint pain.  Skin: Negative for rash.   Past Medical History:  Diagnosis Date  . Ascites   . Asthma   . Bilateral leg edema   . Cancer ([place])   . Chicken pox   . Colon polyps   . Diverticulitis    with perforation 02/2017 and hosp with colostomy bag s/p removal   . Drug-induced neutropenia ([place]) 03/09/2018  . GERD (gastroesophageal reflux disease)   . Hyperlipidemia   . Hypertension   . Multiple myeloma ([place])    dx'ed 01/2018 follows Dr. Tasia Catchings and [facility] H/O   . Pleural effusion   . Pneumonia    03/02/18   . Thyroid nodule   . UTI (urinary tract infection)   . Vitamin D deficiency    Past Surgical History:  Procedure Laterality Date  . ABDOMINAL HYSTERECTOMY    . BREAST BIOPSY Bilateral yrs ago   benign  . CHOLECYSTECTOMY    . COLOSTOMY  2018  . COLOSTOMY REVERSAL     11 2018  . KYPHOPLASTY     11/2017 [place]    Family History  Problem Relation Age of Onset  . Breast cancer Mother 9  . Cancer Mother        breast   . Diabetes Mother   . Heart disease Mother   . Hyperlipidemia Mother   . Hypertension Mother   . Birth defects Brother   . Diabetes Brother   . Heart disease Brother   . Hyperlipidemia Brother   . Cancer Father        prostate met to liver    Social History   Socioeconomic History  . Marital status: Married    Spouse name: Not on file  . Number of children: Not on file  . Years of education: Not on file  .  Highest education level: Not on file  Occupational History  . Not on file  Social Needs  . Financial resource strain: Not hard at all  . Food insecurity:    Worry: Never true    Inability: Never true  . Transportation needs:    Medical: No    Non-medical: No  Tobacco Use  . Smoking status: Former Research scientist (life sciences)  . Smokeless tobacco: Never Used  Substance and Sexual Activity  . Alcohol use: Not Currently  . Drug use: Never  . Sexual activity: Yes    Comment: husband   Lifestyle  . Physical activity:    Days per week: 0 days    Minutes per session: 0 min  . Stress: Not at all  Relationships  . Social connections:    Talks on phone: More than three times a week    Gets together: Once a week    Attends religious service: 1 to 4 times per year    Active member of club or organization: No    Attends meetings of clubs or organizations: Never    Relationship status: Married  . Intimate partner violence:  Fear of current or ex partner: No    Emotionally abused: No    Physically abused: No    Forced sexual activity: No  Other Topics Concern  . Not on file  Social History Narrative   Married    Husband lives in [place] while she gets treatment in this area    She lives alone in this area with family close by    Current Meds  Medication Sig  . acetaminophen (TYLENOL) 500 MG tablet Take 1,000 mg by mouth every 6 (six) hours as needed for mild pain or fever.  Marland Kitchen albuterol (VENTOLIN HFA) 108 (90 Base) MCG/ACT inhaler Inhale 2 puffs into the lungs every 6 (six) hours as needed.  Marland Kitchen apixaban (ELIQUIS) 5 MG TABS tablet Take 1 tablet (5 mg total) by mouth 2 (two) times daily.  . calcium-vitamin D (OSCAL WITH D) 500-200 MG-UNIT tablet Take 1 tablet by mouth daily.  . Cholecalciferol 50000 units capsule Take 1 capsule (50,000 Units total) by mouth once a week.  Marland Kitchen dexamethasone (DECADRON) 4 MG tablet Take 5 tablets (20 mg total) by mouth once a week.  . feeding supplement, ENSURE ENLIVE,  (ENSURE ENLIVE) LIQD Take 237 mLs by mouth 3 (three) times daily between meals.  . fluticasone (FLONASE) 50 MCG/ACT nasal spray Place 1-2 sprays into both nostrils daily. (Patient taking differently: Place 1-2 sprays into both nostrils daily as needed. )  . fluticasone (FLOVENT DISKUS) 50 MCG/BLIST diskus inhaler Inhale 1 puff into the lungs 2 (two) times daily. (Patient taking differently: Inhale 1 puff into the lungs 2 (two) times daily as needed. )  . furosemide (LASIX) 20 MG tablet Take 1 tablet (20 mg total) by mouth daily as needed.  . loperamide (IMODIUM A-D) 2 MG tablet Take 2 mg by mouth 4 (four) times daily as needed for diarrhea or loose stools.  . montelukast (SINGULAIR) 10 MG tablet Take 10 mg by mouth at bedtime.  . ondansetron (ZOFRAN) 8 MG tablet Take 1 tablet (8 mg total) by mouth every 8 (eight) hours as needed for nausea.  Marland Kitchen oxyCODONE (OXY IR/ROXICODONE) 5 MG immediate release tablet Take 1 tablet (5 mg total) by mouth every 8 (eight) hours as needed for moderate pain.  . pantoprazole (PROTONIX) 40 MG tablet Take 1 tablet (40 mg total) by mouth every morning. 30 minutes before food  . pomalidomide (POMALYST) 3 MG capsule Take 1 capsule (3 mg total) by mouth daily. Take with water on days 1-21. Repeat every 28 days.  . potassium chloride SA (K-DUR,KLOR-CON) 20 MEQ tablet Take 1 tablet (20 mEq total) by mouth daily.  . pravastatin (PRAVACHOL) 10 MG tablet Take 1 tablet by mouth at bedtime.  . prochlorperazine (COMPAZINE) 10 MG tablet Take 10 mg by mouth every 6 (six) hours as needed for nausea.  Marland Kitchen tiotropium (SPIRIVA) 18 MCG inhalation capsule Place 1 capsule (18 mcg total) into inhaler and inhale daily.   Allergies  Allergen Reactions  . Codeine Nausea And Vomiting   Recent Results (from the past 2160 hour(s))  Comprehensive metabolic panel     Status: Abnormal   Collection Time: 04/20/18  8:27 AM  Result Value Ref Range   Sodium 143 135 - 145 mmol/L   Potassium 3.3 (L) 3.5  - 5.1 mmol/L   Chloride 107 98 - 111 mmol/L    Comment: Please note change in reference range.   CO2 28 22 - 32 mmol/L   Glucose, Bld 108 (H) 70 - 99 mg/dL  Comment: Please note change in reference range.   BUN 11 8 - 23 mg/dL    Comment: Please note change in reference range.   Creatinine, Ser 0.70 0.44 - 1.00 mg/dL   Calcium 9.1 8.9 - 10.3 mg/dL   Total Protein 6.3 (L) 6.5 - 8.1 g/dL   Albumin 3.3 (L) 3.5 - 5.0 g/dL   AST 16 15 - 41 U/L   ALT 17 0 - 44 U/L    Comment: Please note change in reference range.   Alkaline Phosphatase 60 38 - 126 U/L   Total Bilirubin 1.0 0.3 - 1.2 mg/dL   GFR calc non Af Amer >60 >60 mL/min   GFR calc Af Amer >60 >60 mL/min    Comment: (NOTE) The eGFR has been calculated using the CKD EPI equation. This calculation has not been validated in all clinical situations. eGFR's persistently <60 mL/min signify possible Chronic Kidney Disease.    Anion gap 8 5 - 15    Comment: Performed at [facility], [address] [place], [place] [address]  CBC with Differential/Platelet     Status: Abnormal   Collection Time: 04/20/18  8:27 AM  Result Value Ref Range   WBC 3.7 3.6 - 11.0 K/uL   RBC 4.10 3.80 - 5.20 MIL/uL   Hemoglobin 12.3 12.0 - 16.0 g/dL   HCT 35.8 35.0 - 47.0 %   MCV 87.4 80.0 - 100.0 fL   MCH 30.1 26.0 - 34.0 pg   MCHC 34.5 32.0 - 36.0 g/dL   RDW 15.1 (H) 11.5 - 14.5 %   Platelets 124 (L) 150 - 440 K/uL   Neutrophils Relative % 53 %   Neutro Abs 2.0 1.4 - 6.5 K/uL   Lymphocytes Relative 19 %   Lymphs Abs 0.7 (L) 1.0 - 3.6 K/uL   Monocytes Relative 23 %   Monocytes Absolute 0.9 0.2 - 0.9 K/uL   Eosinophils Relative 4 %   Eosinophils Absolute 0.1 0 - 0.7 K/uL   Basophils Relative 1 %   Basophils Absolute 0.0 0 - 0.1 K/uL    Comment: Performed at [facility], [address] [place], [place] [address]  CBC with Differential/Platelet     Status: Abnormal   Collection Time: 04/26/18  9:53 AM  Result Value Ref Range    WBC 2.4 (L) 3.6 - 11.0 K/uL   RBC 3.84 3.80 - 5.20 MIL/uL   Hemoglobin 11.5 (L) 12.0 - 16.0 g/dL   HCT 33.8 (L) 35.0 - 47.0 %   MCV 88.2 80.0 - 100.0 fL   MCH 30.0 26.0 - 34.0 pg   MCHC 34.1 32.0 - 36.0 g/dL   RDW 15.1 (H) 11.5 - 14.5 %   Platelets 161 150 - 440 K/uL   Neutrophils Relative % 28 %   Neutro Abs 0.7 (L) 1.4 - 6.5 K/uL   Lymphocytes Relative 25 %   Lymphs Abs 0.6 (L) 1.0 - 3.6 K/uL   Monocytes Relative 38 %   Monocytes Absolute 0.9 0.2 - 0.9 K/uL   Eosinophils Relative 8 %   Eosinophils Absolute 0.2 0 - 0.7 K/uL   Basophils Relative 1 %   Basophils Absolute 0.0 0 - 0.1 K/uL    Comment: Performed at [facility], [address] [place],  [address]  Multiple Myeloma Panel (SPEP&IFE w/QIG)     Status: Abnormal   Collection Time: 05/04/18  8:24 AM  Result Value Ref Range   IgG (Immunoglobin G), Serum 509 (L)  700 - 1,600 mg/dL   IgA 15 (L) 87 - 352 mg/dL    Comment: Result confirmed on concentration.   IgM (Immunoglobulin M), Srm 14 (L) 26 - 217 mg/dL    Comment: Result confirmed on concentration.   Total Protein ELP 5.7 (L) 6.0 - 8.5 g/dL   Albumin SerPl Elph-Mcnc 3.1 2.9 - 4.4 g/dL   Alpha 1 0.2 0.0 - 0.4 g/dL   Alpha2 Glob SerPl Elph-Mcnc 0.9 0.4 - 1.0 g/dL   B-Globulin SerPl Elph-Mcnc 1.0 0.7 - 1.3 g/dL   Gamma Glob SerPl Elph-Mcnc 0.5 0.4 - 1.8 g/dL   M Protein SerPl Elph-Mcnc 0.2 (H) Not Observed g/dL    Comment: An additional m spike was observed at a concentration of 0.1 g/dl    Globulin, Total 2.6 2.2 - 3.9 g/dL   Albumin/Glob SerPl 1.2 0.7 - 1.7   IFE 1 Comment     Comment: (NOTE) Immunofixation shows IgG monoclonal protein with lambda light chain specificity. Immunofixation shows IgG monoclonal protein with kappa light chain specificity. Please note that samples from patients receiving DARZALEX(R) (daratumumab) treatment can appear as an "IgG kappa" and mask a complete response. If this patient is receiving DARA, this IFE assay  interference can be removed by ordering test number [id number]-"Immunofixation, Daratumumab-Specific, Serum" and submitting a new sample for testing or by calling the lab to add this test to the current sample.    Please Note Comment     Comment: (NOTE) Protein electrophoresis scan will follow via computer, mail, or courier delivery. Performed At: [facility] [place], [place] [id number] Rush Farmer MD [id number]   Comprehensive metabolic panel     Status: Abnormal   Collection Time: 05/04/18  8:24 AM  Result Value Ref Range   Sodium 141 135 - 145 mmol/L   Potassium 4.5 3.5 - 5.1 mmol/L   Chloride 109 98 - 111 mmol/L    Comment: Please note change in reference range.   CO2 24 22 - 32 mmol/L   Glucose, Bld 105 (H) 70 - 99 mg/dL    Comment: Please note change in reference range.   BUN 16 8 - 23 mg/dL    Comment: Please note change in reference range.   Creatinine, Ser 0.76 0.44 - 1.00 mg/dL   Calcium 10.1 8.9 - 10.3 mg/dL   Total Protein 6.5 6.5 - 8.1 g/dL   Albumin 3.6 3.5 - 5.0 g/dL   AST 15 15 - 41 U/L   ALT 17 0 - 44 U/L    Comment: Please note change in reference range.   Alkaline Phosphatase 66 38 - 126 U/L   Total Bilirubin 0.8 0.3 - 1.2 mg/dL   GFR calc non Af Amer >60 >60 mL/min   GFR calc Af Amer >60 >60 mL/min    Comment: (NOTE) The eGFR has been calculated using the CKD EPI equation. This calculation has not been validated in all clinical situations. eGFR's persistently <60 mL/min signify possible Chronic Kidney Disease.    Anion gap 8 5 - 15    Comment: Performed at [facility], [address] [place], [place] [address]  CBC with Differential/Platelet     Status: Abnormal   Collection Time: 05/04/18  8:24 AM  Result Value Ref Range   WBC 4.4 3.6 - 11.0 K/uL   RBC 4.13 3.80 - 5.20 MIL/uL   Hemoglobin 12.5 12.0 - 16.0 g/dL   HCT 36.6 35.0 - 47.0 %   MCV 88.5 80.0 -  100.0 fL   MCH 30.3 26.0 - 34.0 pg   MCHC 34.3 32.0 -  36.0 g/dL   RDW 14.7 (H) 11.5 - 14.5 %   Platelets 273 150 - 440 K/uL   Neutrophils Relative % 40 %   Neutro Abs 1.8 1.4 - 6.5 K/uL   Lymphocytes Relative 28 %   Lymphs Abs 1.2 1.0 - 3.6 K/uL   Monocytes Relative 29 %   Monocytes Absolute 1.3 (H) 0.2 - 0.9 K/uL   Eosinophils Relative 1 %   Eosinophils Absolute 0.0 0 - 0.7 K/uL   Basophils Relative 2 %   Basophils Absolute 0.1 0 - 0.1 K/uL    Comment: Performed at [facility], [address] [place], [place] [address]  Comprehensive metabolic panel     Status: Abnormal   Collection Time: 05/18/18  8:25 AM  Result Value Ref Range   Sodium 140 135 - 145 mmol/L   Potassium 3.5 3.5 - 5.1 mmol/L   Chloride 106 98 - 111 mmol/L   CO2 25 22 - 32 mmol/L   Glucose, Bld 103 (H) 70 - 99 mg/dL   BUN 12 8 - 23 mg/dL   Creatinine, Ser 0.72 0.44 - 1.00 mg/dL   Calcium 9.3 8.9 - 10.3 mg/dL   Total Protein 6.3 (L) 6.5 - 8.1 g/dL   Albumin 3.4 (L) 3.5 - 5.0 g/dL   AST 16 15 - 41 U/L   ALT 15 0 - 44 U/L   Alkaline Phosphatase 57 38 - 126 U/L   Total Bilirubin 0.8 0.3 - 1.2 mg/dL   GFR calc non Af Amer >60 >60 mL/min   GFR calc Af Amer >60 >60 mL/min    Comment: (NOTE) The eGFR has been calculated using the CKD EPI equation. This calculation has not been validated in all clinical situations. eGFR's persistently <60 mL/min signify possible Chronic Kidney Disease.    Anion gap 9 5 - 15    Comment: Performed at [facility], [address] [place], [place] [address]  CBC with Differential/Platelet     Status: Abnormal   Collection Time: 05/18/18  8:25 AM  Result Value Ref Range   WBC 5.0 3.6 - 11.0 K/uL   RBC 4.10 3.80 - 5.20 MIL/uL   Hemoglobin 12.2 12.0 - 16.0 g/dL   HCT 36.3 35.0 - 47.0 %   MCV 88.4 80.0 - 100.0 fL   MCH 29.8 26.0 - 34.0 pg   MCHC 33.7 32.0 - 36.0 g/dL   RDW 14.6 (H) 11.5 - 14.5 %   Platelets 211 150 - 440 K/uL   Neutrophils Relative % 71 %   Neutro Abs 3.5 1.4 - 6.5 K/uL   Lymphocytes Relative 13 %    Lymphs Abs 0.7 (L) 1.0 - 3.6 K/uL   Monocytes Relative 10 %   Monocytes Absolute 0.5 0.2 - 0.9 K/uL   Eosinophils Relative 5 %   Eosinophils Absolute 0.2 0 - 0.7 K/uL   Basophils Relative 1 %   Basophils Absolute 0.1 0 - 0.1 K/uL    Comment: Performed at [facility], [place], [place] [address]  Kappa/lambda light chains     Status: None   Collection Time: 06/01/18  8:10 AM  Result Value Ref Range   Kappa free light chain 4.7 3.3 - 19.4 mg/L   Lamda free light chains 17.2 5.7 - 26.3 mg/L   Kappa, lamda light chain ratio 0.27 0.26 - 1.65    Comment: (  NOTE) Performed At: [facility] [place], [place] [id number] Rush Farmer MD [id number]   Multiple Myeloma Panel (SPEP&IFE w/QIG)     Status: Abnormal   Collection Time: 06/01/18  8:10 AM  Result Value Ref Range   IgG (Immunoglobin G), Serum 550 (L) 700 - 1,600 mg/dL   IgA 14 (L) 87 - 352 mg/dL    Comment: Result confirmed on concentration.   IgM (Immunoglobulin M), Srm 13 (L) 26 - 217 mg/dL    Comment: Result confirmed on concentration.   Total Protein ELP 6.3 6.0 - 8.5 g/dL   Albumin SerPl Elph-Mcnc 3.6 2.9 - 4.4 g/dL   Alpha 1 0.3 0.0 - 0.4 g/dL   Alpha2 Glob SerPl Elph-Mcnc 1.1 (H) 0.4 - 1.0 g/dL   B-Globulin SerPl Elph-Mcnc 1.0 0.7 - 1.3 g/dL   Gamma Glob SerPl Elph-Mcnc 0.4 0.4 - 1.8 g/dL   M Protein SerPl Elph-Mcnc 0.2 (H) Not Observed g/dL    Comment: An additional m spike was observed at a concentration of 0.1 g/dl    Globulin, Total 2.7 2.2 - 3.9 g/dL   Albumin/Glob SerPl 1.4 0.7 - 1.7   IFE 1 Comment     Comment: (NOTE) Immunofixation shows IgG monoclonal protein with lambda light chain specificity. Immunofixation shows IgG monoclonal protein with kappa light chain specificity. Please note that samples from patients receiving DARZALEX(R) (daratumumab) treatment can appear as an "IgG kappa" and mask a complete response. If this patient is receiving DARA, this  IFE assay interference can be removed by ordering test number [id number]-"Immunofixation, Daratumumab-Specific, Serum" and submitting a new sample for testing or by calling the lab to add this test to the current sample.    Please Note Comment     Comment: (NOTE) Protein electrophoresis scan will follow via computer, mail, or courier delivery. Performed At: [facility] [place], [place] [id number] Rush Farmer MD [id number]   Comprehensive metabolic panel     Status: Abnormal   Collection Time: 06/01/18  8:10 AM  Result Value Ref Range   Sodium 140 135 - 145 mmol/L   Potassium 4.4 3.5 - 5.1 mmol/L   Chloride 105 98 - 111 mmol/L   CO2 25 22 - 32 mmol/L   Glucose, Bld 108 (H) 70 - 99 mg/dL   BUN 17 8 - 23 mg/dL   Creatinine, Ser 0.82 0.44 - 1.00 mg/dL   Calcium 9.5 8.9 - 10.3 mg/dL   Total Protein 6.9 6.5 - 8.1 g/dL   Albumin 3.9 3.5 - 5.0 g/dL   AST 17 15 - 41 U/L   ALT 19 0 - 44 U/L   Alkaline Phosphatase 63 38 - 126 U/L   Total Bilirubin 1.0 0.3 - 1.2 mg/dL   GFR calc non Af Amer >60 >60 mL/min   GFR calc Af Amer >60 >60 mL/min    Comment: (NOTE) The eGFR has been calculated using the CKD EPI equation. This calculation has not been validated in all clinical situations. eGFR's persistently <60 mL/min signify possible Chronic Kidney Disease.    Anion gap 10 5 - 15    Comment: Performed at [facility], [address] [place], [place] [address]  CBC with Differential/Platelet     Status: Abnormal   Collection Time: 06/01/18  8:10 AM  Result Value Ref Range   WBC 3.0 (L) 3.6 - 11.0 K/uL   RBC 4.43 3.80 - 5.20 MIL/uL   Hemoglobin 13.2 12.0 - 16.0 g/dL   HCT  38.5 35.0 - 47.0 %   MCV 87.0 80.0 - 100.0 fL   MCH 29.9 26.0 - 34.0 pg   MCHC 34.4 32.0 - 36.0 g/dL   RDW 14.9 (H) 11.5 - 14.5 %   Platelets 126 (L) 150 - 440 K/uL   Neutrophils Relative % 28 %   Neutro Abs 0.9 (L) 1.4 - 6.5 K/uL   Lymphocytes Relative 25 %   Lymphs Abs 0.7 (L)  1.0 - 3.6 K/uL   Monocytes Relative 32 %   Monocytes Absolute 1.0 (H) 0.2 - 0.9 K/uL   Eosinophils Relative 14 %   Eosinophils Absolute 0.4 0 - 0.7 K/uL   Basophils Relative 1 %   Basophils Absolute 0.0 0 - 0.1 K/uL    Comment: Performed at [facility], [address] [place], [place] [address]  CBC with Differential/Platelet     Status: Abnormal   Collection Time: 06/07/18  9:58 AM  Result Value Ref Range   WBC 4.3 3.6 - 11.0 K/uL   RBC 4.06 3.80 - 5.20 MIL/uL   Hemoglobin 11.9 (L) 12.0 - 16.0 g/dL   HCT 35.9 35.0 - 47.0 %   MCV 88.5 80.0 - 100.0 fL   MCH 29.2 26.0 - 34.0 pg   MCHC 33.0 32.0 - 36.0 g/dL   RDW 14.9 (H) 11.5 - 14.5 %   Platelets 185 150 - 440 K/uL   Neutrophils Relative % 36 %   Neutro Abs 1.6 1.4 - 6.5 K/uL   Lymphocytes Relative 33 %   Lymphs Abs 1.4 1.0 - 3.6 K/uL   Monocytes Relative 26 %   Monocytes Absolute 1.1 (H) 0.2 - 0.9 K/uL   Eosinophils Relative 3 %   Eosinophils Absolute 0.1 0 - 0.7 K/uL   Basophils Relative 2 %   Basophils Absolute 0.1 0 - 0.1 K/uL    Comment: Performed at [facility], [address] [place], [place] [address]  Comprehensive metabolic panel     Status: Abnormal   Collection Time: 06/15/18  8:50 AM  Result Value Ref Range   Sodium 140 135 - 145 mmol/L   Potassium 3.7 3.5 - 5.1 mmol/L   Chloride 108 98 - 111 mmol/L   CO2 25 22 - 32 mmol/L   Glucose, Bld 106 (H) 70 - 99 mg/dL   BUN 13 8 - 23 mg/dL   Creatinine, Ser 0.72 0.44 - 1.00 mg/dL   Calcium 8.6 (L) 8.9 - 10.3 mg/dL   Total Protein 6.4 (L) 6.5 - 8.1 g/dL   Albumin 3.6 3.5 - 5.0 g/dL   AST 16 15 - 41 U/L   ALT 17 0 - 44 U/L   Alkaline Phosphatase 58 38 - 126 U/L   Total Bilirubin 0.8 0.3 - 1.2 mg/dL   GFR calc non Af Amer >60 >60 mL/min   GFR calc Af Amer >60 >60 mL/min    Comment: (NOTE) The eGFR has been calculated using the CKD EPI equation. This calculation has not been validated in all clinical situations. eGFR's persistently <60 mL/min  signify possible Chronic Kidney Disease.    Anion gap 7 5 - 15    Comment: Performed at [facility], [address] [place], [place] [address]  CBC with Differential/Platelet     Status: Abnormal   Collection Time: 06/15/18  8:50 AM  Result Value Ref Range   WBC 5.9 3.6 - 11.0 K/uL   RBC 4.17 3.80 - 5.20 MIL/uL   Hemoglobin 12.4 12.0 -  16.0 g/dL   HCT 36.5 35.0 - 47.0 %   MCV 87.5 80.0 - 100.0 fL   MCH 29.7 26.0 - 34.0 pg   MCHC 33.9 32.0 - 36.0 g/dL   RDW 14.8 (H) 11.5 - 14.5 %   Platelets 226 150 - 440 K/uL   Neutrophils Relative % 78 %   Neutro Abs 4.7 1.4 - 6.5 K/uL   Lymphocytes Relative 14 %   Lymphs Abs 0.8 (L) 1.0 - 3.6 K/uL   Monocytes Relative 5 %   Monocytes Absolute 0.3 0.2 - 0.9 K/uL   Eosinophils Relative 2 %   Eosinophils Absolute 0.1 0 - 0.7 K/uL   Basophils Relative 1 %   Basophils Absolute 0.0 0 - 0.1 K/uL    Comment: Performed at [facility], [address] [place], [place] [address]  Comprehensive metabolic panel     Status: Abnormal   Collection Time: 06/29/18  9:04 AM  Result Value Ref Range   Sodium 140 135 - 145 mmol/L   Potassium 3.7 3.5 - 5.1 mmol/L   Chloride 108 98 - 111 mmol/L   CO2 25 22 - 32 mmol/L   Glucose, Bld 109 (H) 70 - 99 mg/dL   BUN 11 8 - 23 mg/dL   Creatinine, Ser 0.74 0.44 - 1.00 mg/dL   Calcium 9.0 8.9 - 10.3 mg/dL   Total Protein 6.4 (L) 6.5 - 8.1 g/dL   Albumin 3.7 3.5 - 5.0 g/dL   AST 17 15 - 41 U/L   ALT 19 0 - 44 U/L   Alkaline Phosphatase 51 38 - 126 U/L   Total Bilirubin 1.1 0.3 - 1.2 mg/dL   GFR calc non Af Amer >60 >60 mL/min   GFR calc Af Amer >60 >60 mL/min    Comment: (NOTE) The eGFR has been calculated using the CKD EPI equation. This calculation has not been validated in all clinical situations. eGFR's persistently <60 mL/min signify possible Chronic Kidney Disease.    Anion gap 7 5 - 15    Comment: Performed at [facility], [address] [place], [place] [address]  CBC with  Differential/Platelet     Status: Abnormal   Collection Time: 06/29/18  9:04 AM  Result Value Ref Range   WBC 4.0 3.6 - 11.0 K/uL   RBC 4.20 3.80 - 5.20 MIL/uL   Hemoglobin 12.2 12.0 - 16.0 g/dL   HCT 36.9 35.0 - 47.0 %   MCV 87.7 80.0 - 100.0 fL   MCH 29.1 26.0 - 34.0 pg   MCHC 33.2 32.0 - 36.0 g/dL   RDW 14.8 (H) 11.5 - 14.5 %   Platelets 134 (L) 150 - 440 K/uL   Neutrophils Relative % 33 %   Neutro Abs 1.3 (L) 1.4 - 6.5 K/uL   Lymphocytes Relative 23 %   Lymphs Abs 0.9 (L) 1.0 - 3.6 K/uL   Monocytes Relative 30 %   Monocytes Absolute 1.2 (H) 0.2 - 0.9 K/uL   Eosinophils Relative 11 %   Eosinophils Absolute 0.4 0 - 0.7 K/uL   Basophils Relative 3 %   Basophils Absolute 0.1 0 - 0.1 K/uL    Comment: Performed at [facility], [address] [place], [place] [address]   Objective  Body mass index is 25.9 kg/m. Wt Readings from Last 3 Encounters:  07/13/18 141 lb 9.6 oz (64.2 kg)  06/29/18 141 lb 9 oz (64.2 kg)  06/15/18 140 lb 12.8 oz (63.9 kg)   Temp  Readings from Last 3 Encounters:  07/13/18 97.9 F (36.6 C) (Oral)  06/29/18 (!) 96.1 F (35.6 C) (Tympanic)  06/15/18 (!) 95 F (35 C) (Tympanic)   BP Readings from Last 3 Encounters:  07/13/18 140/86  06/29/18 (!) 141/73  06/15/18 138/75   Pulse Readings from Last 3 Encounters:  07/13/18 66  06/29/18 (!) 51  06/15/18 64    Physical Exam  Constitutional: She is oriented to person, place, and time. Vital signs are normal. She appears well-developed and well-nourished. She is cooperative.  HENT:  Head: Normocephalic and atraumatic.  Mouth/Throat: Oropharynx is clear and moist and mucous membranes are normal.  Eyes: Pupils are equal, round, and reactive to light. Conjunctivae are normal.  Cardiovascular: Normal rate, regular rhythm and normal heart sounds.  Pulmonary/Chest: Effort normal and breath sounds normal.  Abdominal: There is tenderness. There is CVA tenderness.  Mild right cva ttp    Neurological: She is alert and oriented to person, place, and time. Gait normal.  Skin: Skin is warm, dry and intact.  Psychiatric: She has a normal mood and affect. Her speech is normal and behavior is normal. Judgment and thought content normal. Cognition and memory are normal.  Nursing note and vitals reviewed.   Assessment   1. C/w UTI Plan  1. UA culture  augmentin bid x 1 week, cipro interferes with pomalyst If not better consider bactrim ds   Disc with H/o if ok to get flu shot high dose  Provider: Dr. Olivia Mackie McLean-Scocuzza-Internal Medicine

## 2018-07-13 NOTE — Progress Notes (Signed)
Pre visit review using our clinic review tool, if applicable. No additional management support is needed unless otherwise documented below in the visit note. 

## 2018-07-13 NOTE — Patient Instructions (Addendum)
AZO with pyridium  Increase water intake  Cranberry supplements with D mannose  Antibiotics 2x per day with food x 1 week   Urinary Tract Infection, Adult A urinary tract infection (UTI) is an infection of any part of the urinary tract. The urinary tract includes the:  Kidneys.  Ureters.  Bladder.  Urethra.  These organs make, store, and get rid of pee (urine) in the body. Follow these instructions at home:  Take over-the-counter and prescription medicines only as told by your doctor.  If you were prescribed an antibiotic medicine, take it as told by your doctor. Do not stop taking the antibiotic even if you start to feel better.  Avoid the following drinks: ? Alcohol. ? Caffeine. ? Tea. ? Carbonated drinks.  Drink enough fluid to keep your pee clear or pale yellow.  Keep all follow-up visits as told by your doctor. This is important.  Make sure to: ? Empty your bladder often and completely. Do not to hold pee for long periods of time. ? Empty your bladder before and after sex. ? Wipe from front to back after a bowel movement if you are female. Use each tissue one time when you wipe. Contact a doctor if:  You have back pain.  You have a fever.  You feel sick to your stomach (nauseous).  You throw up (vomit).  Your symptoms do not get better after 3 days.  Your symptoms go away and then come back. Get help right away if:  You have very bad back pain.  You have very bad lower belly (abdominal) pain.  You are throwing up and cannot keep down any medicines or water. This information is not intended to replace advice given to you by your health care provider. Make sure you discuss any questions you have with your health care provider. Document Released: 03/24/2008 Document Revised: 03/13/2016 Document Reviewed: 08/27/2015 Elsevier Interactive Patient Education  [employer].

## 2018-07-15 ENCOUNTER — Inpatient Hospital Stay (HOSPITAL_BASED_OUTPATIENT_CLINIC_OR_DEPARTMENT_OTHER): Payer: Medicare PPO | Admitting: Oncology

## 2018-07-15 ENCOUNTER — Encounter: Payer: Self-pay | Admitting: Oncology

## 2018-07-15 ENCOUNTER — Inpatient Hospital Stay: Payer: Medicare PPO

## 2018-07-15 ENCOUNTER — Telehealth: Payer: Self-pay | Admitting: *Deleted

## 2018-07-15 ENCOUNTER — Other Ambulatory Visit: Payer: Self-pay

## 2018-07-15 VITALS — BP 143/58 | HR 59 | Temp 97.0°F | Resp 20 | Wt 140.0 lb

## 2018-07-15 DIAGNOSIS — R300 Dysuria: Secondary | ICD-10-CM

## 2018-07-15 DIAGNOSIS — N23 Unspecified renal colic: Secondary | ICD-10-CM

## 2018-07-15 DIAGNOSIS — N390 Urinary tract infection, site not specified: Secondary | ICD-10-CM

## 2018-07-15 DIAGNOSIS — Z5189 Encounter for other specified aftercare: Secondary | ICD-10-CM

## 2018-07-15 DIAGNOSIS — E876 Hypokalemia: Secondary | ICD-10-CM

## 2018-07-15 DIAGNOSIS — R350 Frequency of micturition: Secondary | ICD-10-CM

## 2018-07-15 DIAGNOSIS — C9002 Multiple myeloma in relapse: Secondary | ICD-10-CM | POA: Diagnosis not present

## 2018-07-15 DIAGNOSIS — C7951 Secondary malignant neoplasm of bone: Secondary | ICD-10-CM

## 2018-07-15 DIAGNOSIS — T451X5S Adverse effect of antineoplastic and immunosuppressive drugs, sequela: Secondary | ICD-10-CM

## 2018-07-15 DIAGNOSIS — R5382 Chronic fatigue, unspecified: Secondary | ICD-10-CM

## 2018-07-15 DIAGNOSIS — Z5112 Encounter for antineoplastic immunotherapy: Secondary | ICD-10-CM | POA: Diagnosis not present

## 2018-07-15 DIAGNOSIS — J449 Chronic obstructive pulmonary disease, unspecified: Secondary | ICD-10-CM

## 2018-07-15 DIAGNOSIS — Z87891 Personal history of nicotine dependence: Secondary | ICD-10-CM

## 2018-07-15 DIAGNOSIS — E785 Hyperlipidemia, unspecified: Secondary | ICD-10-CM

## 2018-07-15 DIAGNOSIS — Z87440 Personal history of urinary (tract) infections: Secondary | ICD-10-CM

## 2018-07-15 DIAGNOSIS — I10 Essential (primary) hypertension: Secondary | ICD-10-CM

## 2018-07-15 DIAGNOSIS — Z7901 Long term (current) use of anticoagulants: Secondary | ICD-10-CM

## 2018-07-15 DIAGNOSIS — D701 Agranulocytosis secondary to cancer chemotherapy: Secondary | ICD-10-CM

## 2018-07-15 DIAGNOSIS — Z79899 Other long term (current) drug therapy: Secondary | ICD-10-CM

## 2018-07-15 DIAGNOSIS — K219 Gastro-esophageal reflux disease without esophagitis: Secondary | ICD-10-CM

## 2018-07-15 DIAGNOSIS — R5381 Other malaise: Secondary | ICD-10-CM

## 2018-07-15 LAB — URINALYSIS, ROUTINE W REFLEX MICROSCOPIC
BACTERIA UA: NONE SEEN /HPF
Bilirubin Urine: NEGATIVE
GLUCOSE, UA: NEGATIVE
HYALINE CAST: NONE SEEN /LPF
KETONES UR: NEGATIVE
Nitrite: NEGATIVE
PH: 6.5 (ref 5.0–8.0)
Specific Gravity, Urine: 1.008 (ref 1.001–1.03)

## 2018-07-15 LAB — COMPREHENSIVE METABOLIC PANEL
ALT: 15 U/L (ref 0–44)
AST: 15 U/L (ref 15–41)
Albumin: 3.5 g/dL (ref 3.5–5.0)
Alkaline Phosphatase: 47 U/L (ref 38–126)
Anion gap: 7 (ref 5–15)
BUN: 15 mg/dL (ref 8–23)
CHLORIDE: 107 mmol/L (ref 98–111)
CO2: 26 mmol/L (ref 22–32)
Calcium: 9.1 mg/dL (ref 8.9–10.3)
Creatinine, Ser: 0.65 mg/dL (ref 0.44–1.00)
GFR calc Af Amer: >60 mL/min (ref >60)
Glucose, Bld: 105 mg/dL — ABNORMAL HIGH (ref 70–99)
POTASSIUM: 3.2 mmol/L — AB (ref 3.5–5.1)
Sodium: 140 mmol/L (ref 135–145)
Total Bilirubin: 1.2 mg/dL (ref 0.3–1.2)
Total Protein: 6.5 g/dL (ref 6.5–8.1)

## 2018-07-15 LAB — CBC WITH DIFFERENTIAL/PLATELET
Basophils Absolute: 0 10*3/uL (ref 0–0.1)
Basophils Relative: 0 %
EOS PCT: 1 %
Eosinophils Absolute: 0.1 10*3/uL (ref 0–0.7)
HCT: 36.1 % (ref 35.0–47.0)
HEMOGLOBIN: 12.1 g/dL (ref 12.0–16.0)
LYMPHS ABS: 0.7 10*3/uL — AB (ref 1.0–3.6)
LYMPHS PCT: 7 %
MCH: 29.6 pg (ref 26.0–34.0)
MCHC: 33.7 g/dL (ref 32.0–36.0)
MCV: 87.9 fL (ref 80.0–100.0)
Monocytes Absolute: 1.4 10*3/uL — ABNORMAL HIGH (ref 0.2–0.9)
Monocytes Relative: 15 %
NEUTROS ABS: 7 10*3/uL — AB (ref 1.4–6.5)
NEUTROS PCT: 77 %
Platelets: 210 10*3/uL (ref 150–440)
RBC: 4.1 MIL/uL (ref 3.80–5.20)
RDW: 15.1 % — ABNORMAL HIGH (ref 11.5–14.5)
WBC: 9.2 10*3/uL (ref 3.6–11.0)

## 2018-07-15 LAB — URINALYSIS, COMPLETE (UACMP) WITH MICROSCOPIC
BILIRUBIN URINE: NEGATIVE
Bacteria, UA: NONE SEEN
Glucose, UA: NEGATIVE mg/dL
Hgb urine dipstick: NEGATIVE
KETONES UR: NEGATIVE mg/dL
Nitrite: POSITIVE — AB
Protein, ur: NEGATIVE mg/dL
Specific Gravity, Urine: 1.01 (ref 1.005–1.030)
pH: 6 (ref 5.0–8.0)

## 2018-07-15 LAB — URINE CULTURE
MICRO NUMBER:: 91146046
SPECIMEN QUALITY: ADEQUATE

## 2018-07-15 MED ORDER — PHENAZOPYRIDINE HCL 200 MG PO TABS: 200.0000 mg | ORAL_TABLET | Freq: Three times a day (TID) | ORAL | 0 refills | Status: DC | PRN

## 2018-07-15 MED ORDER — SODIUM CHLORIDE 0.9 % IV SOLN
10.0000 meq | Freq: Once | INTRAVENOUS | Status: AC
  Administered 2018-07-15: 10 meq via INTRAVENOUS
  Filled 2018-07-15: qty 5

## 2018-07-15 MED ORDER — POTASSIUM CHLORIDE CRYS ER 20 MEQ PO TBCR: 20.0000 meq | EXTENDED_RELEASE_TABLET | Freq: Every day | ORAL | 2 refills | Status: AC

## 2018-07-15 MED ORDER — CEFUROXIME AXETIL 250 MG PO TABS: 250.0000 mg | ORAL_TABLET | Freq: Two times a day (BID) | ORAL | 0 refills | Status: DC

## 2018-07-15 NOTE — Telephone Encounter (Signed)
Patient called to report that she is having back pain at her kidneys and frequency, dysuria, and burning with urination. She saw her PCP and per patient was told that she does not have bacteria in her urine and was not treated. She feels she is getting worse and wants to be seen.  Appointment accepted for this morning for lab/ Symptom Management [facility] NP

## 2018-07-15 NOTE — Progress Notes (Signed)
Symptom Management Consult note [facility]  [phone] [phone]  Patient Care Team: McLean-Scocuzza, Nino Glow, MD as PCP - General (Internal Medicine)   Name of the patient: Cassie Jones  [id number]  1945/12/12   Date of visit: 07/15/2018  Diagnosis: There are no diagnoses linked to this encounter.   Chief Complaint: Dysuria  Current Treatment: s/p cycle 6 daratumumab and 3 mg Pomalyst daily  Oncology History: She was last seen by Dr. Tasia Catchings oncologist on 06/29/2018 for toxicity assessment prior to daratumumab infusion. She reported bilateral aching back pain unrelieved with Tylenol.  Started on oxycodone 5 mg as needed.  Ordered a bone scan for further evaluation of back pain.  Complained of fatigue that was chronic stable and at her baseline.  They proceeded with treatment.  ANC was 1.3.  Granix was held.  Recently finished every 2 weeks daratumumab and she will now be scheduled to every 4 weeks.  Labs revealed stable multiple myeloma.  M spike 0.2.   Oncology History   Patient was diagnosed with IgG lambda, ISS2, t (4,14) multiple myeloma on February 03, 2017, she has hemoglobin of 9, calcium 8.9, LDH 132, beta microglobulin 4.6, albumin 3.2.  Skeletal survey negative.  PET scan with single nonspecific humeral lesion and a thyroid nodule.  Marrow with 60% plasmacytosis by CD138 IHC, FISH with hyperdiploidy t(4, 14), 1 q. gain.. Per note, she was treated with CyborD (M spike 3.6 at start)  from 01/2017 to 02/2017. Her treatment was switched to Revlimid Velcade dexamethasone from 02/2017, M spike 1.2 at start,and was found to have progression of disease in 09/2017, when her M spike increased to 1.8, and developed compression fracture in 10/2016. She was restarted on RVD using weekly Velcade.  She was recently seen by Dr. Boris Sharper and recommended to start on a combination of daratumumab, Pomalyst, dexamethasone. Per patient she received her first daratumumab  last week with split dose '8mg'$ /kg given on 3/14 and 3/15.  Patient reports that she tolerate the treatment well except mild infusion reaction.  # Patient also reports that she has got dental clearance and has been given 1 dose of Zometa by Dr. Remus Loffler (unkown dates).  Autologous transplant has been discussed with patient at [facility] and patient has not decided.  # Patient was seen by nurse practitioner last week prior to her cycle 2 daratumumab.  She reports feeling shortness of breath, having cough, believed to have bronchitis and was prescribed Levaquin 500 mg p.o. daily for 7 days, and albuterol as needed.  She also had a VQ scan low probability for pulmonary embolism.  # 01/29/2018 5th weekly treatment of Daratumumab,  Cycle 2 Pomalyst was started for a week and was nterrupted due to acute bronchitis treated with outpatient Azithromycin.    # 02/16/2018 developed right upper extremity swelling and tenderness, confirmed to be DVT. She was taking Aspirin.  Started on Eliquis. Aspirin is stopped.  # 02/16/2018 6th Daratumumab treatment. Was started on Cycle 3 Pomalyst, she has completed about 2 weeks treatment. She was admitted to hospital on 02/21/2018 for Klebsiella UTI and oncology was not consulted. She was treatment with antibiotics and continued on Pomalyst. She was hospitalized again on 03/02/2018 for pneumonia, COPD exacerbation/emphasema and oncology was consulted and patient was seen by my colleague Dr.Finnergan and advise patient to stop Pomalyst.   # She was  seen by [facility] Dr.Tuchman who recommends dose reduced Pomalyst '3mg'$ . This was previously discussed with patient, and she was reluctant  to be restarted on Pomalyst. Today she seems to be more acceptable for restarting Pomalyst, but requests to start in a few weeks so that she can have a nice break.   #Previously declined bone marrow transplant evaluation.      Multiple myeloma not having achieved remission ([place])   03/01/2017 Initial  Diagnosis    Multiple myeloma not having achieved remission ([place])     Subjective Data:  DXI:[id number], Nino Glow, MD Chief Complaint  Patient presents with  . Dysuria  . Back Pain    Right Kidney    Current Issues:  Presents with 4 days of dysuria, urinary urgency, urinary frequency and right flank pain Associated symptoms include:  dysuria, flank pain on the right, hematuria, urinary frequency, urinary hesitancy and urinary urgency  There is no history of of similar symptoms. Sexually active:  Yes with female.   No concern for STI.  Prior to Admission medications   Medication Sig Start Date End Date Taking? Authorizing Provider  acetaminophen (TYLENOL) 500 MG tablet Take 1,000 mg by mouth every 6 (six) hours as needed for mild pain or fever.   Yes [provider]  albuterol (VENTOLIN HFA) 108 (90 Base) MCG/ACT inhaler Inhale 2 puffs into the lungs every 6 (six) hours as needed. 01/15/18  Yes Verlon Au, NP  amoxicillin-clavulanate (AUGMENTIN) 875-125 MG tablet Take 1 tablet by mouth 2 (two) times daily. With food 07/13/18  Yes McLean-Scocuzza, Nino Glow, MD  apixaban (ELIQUIS) 5 MG TABS tablet Take 1 tablet (5 mg total) by mouth 2 (two) times daily. 04/06/18  Yes Earlie Server, MD  calcium-vitamin D (OSCAL WITH D) 500-200 MG-UNIT tablet Take 1 tablet by mouth daily. 05/18/18  Yes Earlie Server, MD  Cholecalciferol 50000 units capsule Take 1 capsule (50,000 Units total) by mouth once a week. 03/04/18  Yes McLean-Scocuzza, Nino Glow, MD  dexamethasone (DECADRON) 4 MG tablet Take 5 tablets (20 mg total) by mouth once a week. 06/29/18  Yes Earlie Server, MD  fluticasone [facility]) 50 MCG/ACT nasal spray Place 1-2 sprays into both nostrils daily. Patient taking differently: Place 1-2 sprays into both nostrils daily as needed.  03/02/18  Yes McLean-Scocuzza, Nino Glow, MD  fluticasone (FLOVENT DISKUS) 50 MCG/BLIST diskus inhaler Inhale 1 puff into the lungs 2 (two) times daily. Patient taking  differently: Inhale 1 puff into the lungs 2 (two) times daily as needed.  01/22/18  Yes Earlie Server, MD  furosemide (LASIX) 20 MG tablet Take 1 tablet (20 mg total) by mouth daily as needed. 01/28/18  Yes McLean-Scocuzza, Nino Glow, MD  montelukast (SINGULAIR) 10 MG tablet Take 10 mg by mouth at bedtime.   Yes [provider]  ondansetron (ZOFRAN) 8 MG tablet Take 1 tablet (8 mg total) by mouth every 8 (eight) hours as needed for nausea. 06/29/18  Yes Earlie Server, MD  oxyCODONE (OXY IR/ROXICODONE) 5 MG immediate release tablet Take 1 tablet (5 mg total) by mouth every 8 (eight) hours as needed for moderate pain. 06/29/18  Yes Earlie Server, MD  pantoprazole (PROTONIX) 40 MG tablet Take 1 tablet (40 mg total) by mouth every morning. 30 minutes before food 06/29/18  Yes McLean-Scocuzza, Nino Glow, MD  pomalidomide (POMALYST) 3 MG capsule Take 1 capsule (3 mg total) by mouth daily. Take with water on days 1-21. Repeat every 28 days. 06/29/18  Yes Earlie Server, MD  pravastatin (PRAVACHOL) 10 MG tablet Take 1 tablet by mouth at bedtime. 12/11/16  Yes [provider]  prochlorperazine (  COMPAZINE) 10 MG tablet Take 10 mg by mouth every 6 (six) hours as needed for nausea.   Yes [provider]  tiotropium (SPIRIVA) 18 MCG inhalation capsule Place 1 capsule (18 mcg total) into inhaler and inhale daily. 05/31/18  Yes Laverle Hobby, MD  cefUROXime (CEFTIN) 250 MG tablet Take 1 tablet (250 mg total) by mouth 2 (two) times daily with a meal. 07/15/18   Angellee Cohill, Wandra Feinstein, NP  feeding supplement, ENSURE ENLIVE, (ENSURE ENLIVE) LIQD Take 237 mLs by mouth 3 (three) times daily between meals. 03/05/18   Fritzi Mandes, MD  loperamide (IMODIUM A-D) 2 MG tablet Take 2 mg by mouth 4 (four) times daily as needed for diarrhea or loose stools.    [provider]  phenazopyridine (PYRIDIUM) 200 MG tablet Take 1 tablet (200 mg total) by mouth 3 (three) times daily as needed for pain. 07/15/18   Jacquelin Hawking, NP    potassium chloride SA (K-DUR,KLOR-CON) 20 MEQ tablet Take 1 tablet (20 mEq total) by mouth daily. 07/15/18   Jacquelin Hawking, NP    Review of Systems:Review of Systems  Constitutional: Positive for malaise/fatigue. Negative for chills, fever and weight loss.  HENT: Negative for congestion, ear pain and tinnitus.   Eyes: Negative.  Negative for blurred vision and double vision.  Respiratory: Negative.  Negative for cough, sputum production and shortness of breath.   Cardiovascular: Negative.  Negative for chest pain, palpitations and leg swelling.  Gastrointestinal: Negative.  Negative for abdominal pain, constipation, diarrhea, nausea and vomiting.  Genitourinary: Positive for dysuria, flank pain, frequency, hematuria and urgency.  Musculoskeletal: Negative for back pain and falls.  Skin: Negative.  Negative for rash.  Neurological: Positive for weakness. Negative for headaches.  Endo/Heme/Allergies: Negative.  Does not bruise/bleed easily.  Psychiatric/Behavioral: Negative.  Negative for depression. The patient is not nervous/anxious and does not have insomnia.     PE:  BP (!) 143/58 (BP Location: Right Arm, Patient Position: Sitting)   Pulse (!) 59   Temp (!) 97 F (36.1 C) (Tympanic)   Resp 20   Wt 140 lb (63.5 kg)   BMI 25.61 kg/m   Physical Exam  Constitutional: She is oriented to person, place, and time. Vital signs are normal. She appears well-developed and well-nourished.  HENT:  Head: Normocephalic and atraumatic.  Eyes: Pupils are equal, round, and reactive to light.  Neck: Normal range of motion.  Cardiovascular: Normal rate, regular rhythm and normal heart sounds.  No murmur heard. Pulmonary/Chest: Effort normal and breath sounds normal. She has no wheezes.  Abdominal: Soft. Normal appearance and bowel sounds are normal. She exhibits no distension. There is no tenderness. There is CVA tenderness.  Musculoskeletal: Normal range of motion. She exhibits no edema.   Neurological: She is alert and oriented to person, place, and time.  Skin: Skin is warm and dry. No rash noted.  Psychiatric: Judgment normal.     Results for orders placed or performed in visit on 07/15/18  Urine culture  Result Value Ref Range   Specimen Description      URINE, CLEAN CATCH Performed at [facility], [address], [place], [place] [address]    Special Requests      NONE Performed at [facility], [address], [place], [place] [address]    Culture      NO GROWTH Performed at [facility], [place] [address] [place], [place] [address]    Report Status 07/16/2018 FINAL  Urinalysis, Complete w Microscopic  Result Value Ref Range   Color, Urine AMBER (A) YELLOW   APPearance CLEAR (A) CLEAR   Specific Gravity, Urine 1.010 1.005 - 1.030   pH 6.0 5.0 - 8.0   Glucose, UA NEGATIVE NEGATIVE mg/dL   Hgb urine dipstick NEGATIVE NEGATIVE   Bilirubin Urine NEGATIVE NEGATIVE   Ketones, ur NEGATIVE NEGATIVE mg/dL   Protein, ur NEGATIVE NEGATIVE mg/dL   Nitrite POSITIVE (A) NEGATIVE   Leukocytes, UA SMALL (A) NEGATIVE   RBC / HPF 0-5 0 - 5 RBC/hpf   WBC, UA 21-50 0 - 5 WBC/hpf   Bacteria, UA NONE SEEN NONE SEEN   Squamous Epithelial / LPF 0-5 0 - 5   Mucus PRESENT     Assessment and Plan:  Patient presents to [facility] for urinary tract infection X 4 days.   Multiple myeloma: s/p cycle 6 Pomalyst and every 2 week daratumumab with Granix.  Completed biweekly daratumumab and will now be switched to monthly daratumumab.  Last received on 06/29/2018.  Virgel Paling monthly.  Appears to be tolerating well with intermittent neutropenia.  Granix held on 06/29/18 for an ANC 1.3.  She is scheduled to return to clinic on 07/27/2018 for consideration of cycle 7 Pomalyst and monthly daratumumab.  Urinary tract infection: Recently seen by PCP Dr. Terese Door (07/13/18) for urinary tract-like symptoms including right flank pain for approximately 1 day.  She was  given Augmentin twice daily x1 week given Pomalyst has interference with several different antibiotics.  Unfortunately symptoms worsened and she presented to symptom management.  UA was positive for nitrites and small amount of leukocytes, culture + proteus mirabilis. Sensitive to Augmentin and Ceftin. Spoke with [employer] oral pharmacist who consulted up-to-date for drug interactions and RX Ceftin 250 mg twice daily for 10 days was prescribed along with Pyridium 200 mg TID for urinary discomfort. Will follow-up with patient for the next several days to make sure she is improving.   Hypokalemia: Found to have a potassium level 3.2.  Appears to be an ongoing issue. Will give 1 liter Nacl while in clinic and 20 meq IV potassium. RX 20 mEq daily for 7 to 10 days.  Encouraged potassium rich foods. Education was provided for patient.     Greater than 50% was spent in counseling and coordination of care with this patient including but not limited to discussion of the relevant topics above (See A&P) including, but not limited to diagnosis and management of acute and chronic medical conditions.   Faythe Casa, NP 07/19/2018 10:18 AM

## 2018-07-16 LAB — URINE CULTURE: Culture: NO GROWTH

## 2018-07-20 ENCOUNTER — Other Ambulatory Visit: Payer: Self-pay | Admitting: *Deleted

## 2018-07-21 MED ORDER — POMALIDOMIDE 3 MG PO CAPS: 3.0000 mg | ORAL_CAPSULE | Freq: Every day | ORAL | 0 refills | Status: DC

## 2018-07-27 ENCOUNTER — Encounter: Payer: Self-pay | Admitting: Oncology

## 2018-07-27 ENCOUNTER — Other Ambulatory Visit: Payer: Self-pay

## 2018-07-27 ENCOUNTER — Inpatient Hospital Stay: Payer: Medicare PPO | Attending: Oncology

## 2018-07-27 ENCOUNTER — Inpatient Hospital Stay: Payer: Medicare PPO

## 2018-07-27 ENCOUNTER — Inpatient Hospital Stay (HOSPITAL_BASED_OUTPATIENT_CLINIC_OR_DEPARTMENT_OTHER): Payer: Medicare PPO | Admitting: Oncology

## 2018-07-27 VITALS — BP 126/70 | HR 59 | Temp 96.9°F | Resp 16 | Wt 140.3 lb

## 2018-07-27 DIAGNOSIS — I10 Essential (primary) hypertension: Secondary | ICD-10-CM

## 2018-07-27 DIAGNOSIS — N390 Urinary tract infection, site not specified: Secondary | ICD-10-CM

## 2018-07-27 DIAGNOSIS — Z5112 Encounter for antineoplastic immunotherapy: Secondary | ICD-10-CM

## 2018-07-27 DIAGNOSIS — Z923 Personal history of irradiation: Secondary | ICD-10-CM

## 2018-07-27 DIAGNOSIS — E785 Hyperlipidemia, unspecified: Secondary | ICD-10-CM

## 2018-07-27 DIAGNOSIS — Z23 Encounter for immunization: Secondary | ICD-10-CM | POA: Insufficient documentation

## 2018-07-27 DIAGNOSIS — D701 Agranulocytosis secondary to cancer chemotherapy: Secondary | ICD-10-CM | POA: Insufficient documentation

## 2018-07-27 DIAGNOSIS — J449 Chronic obstructive pulmonary disease, unspecified: Secondary | ICD-10-CM | POA: Diagnosis not present

## 2018-07-27 DIAGNOSIS — Z7901 Long term (current) use of anticoagulants: Secondary | ICD-10-CM

## 2018-07-27 DIAGNOSIS — Z79899 Other long term (current) drug therapy: Secondary | ICD-10-CM | POA: Insufficient documentation

## 2018-07-27 DIAGNOSIS — C9002 Multiple myeloma in relapse: Secondary | ICD-10-CM | POA: Insufficient documentation

## 2018-07-27 DIAGNOSIS — K219 Gastro-esophageal reflux disease without esophagitis: Secondary | ICD-10-CM | POA: Diagnosis not present

## 2018-07-27 DIAGNOSIS — Z87440 Personal history of urinary (tract) infections: Secondary | ICD-10-CM

## 2018-07-27 DIAGNOSIS — M549 Dorsalgia, unspecified: Secondary | ICD-10-CM

## 2018-07-27 DIAGNOSIS — R5383 Other fatigue: Secondary | ICD-10-CM | POA: Diagnosis not present

## 2018-07-27 DIAGNOSIS — C7951 Secondary malignant neoplasm of bone: Secondary | ICD-10-CM

## 2018-07-27 DIAGNOSIS — Z87891 Personal history of nicotine dependence: Secondary | ICD-10-CM | POA: Diagnosis not present

## 2018-07-27 DIAGNOSIS — D702 Other drug-induced agranulocytosis: Secondary | ICD-10-CM

## 2018-07-27 DIAGNOSIS — Z8042 Family history of malignant neoplasm of prostate: Secondary | ICD-10-CM | POA: Insufficient documentation

## 2018-07-27 DIAGNOSIS — T451X5S Adverse effect of antineoplastic and immunosuppressive drugs, sequela: Secondary | ICD-10-CM

## 2018-07-27 DIAGNOSIS — M545 Low back pain, unspecified: Secondary | ICD-10-CM

## 2018-07-27 DIAGNOSIS — Z803 Family history of malignant neoplasm of breast: Secondary | ICD-10-CM | POA: Diagnosis not present

## 2018-07-27 DIAGNOSIS — D6959 Other secondary thrombocytopenia: Secondary | ICD-10-CM

## 2018-07-27 DIAGNOSIS — Z86718 Personal history of other venous thrombosis and embolism: Secondary | ICD-10-CM

## 2018-07-27 DIAGNOSIS — Z5111 Encounter for antineoplastic chemotherapy: Secondary | ICD-10-CM

## 2018-07-27 LAB — CBC WITH DIFFERENTIAL/PLATELET
Basophils Absolute: 0.1 10*3/uL (ref 0–0.1)
Basophils Relative: 2 %
EOS ABS: 0.6 10*3/uL (ref 0–0.7)
EOS PCT: 13 %
HCT: 36 % (ref 35.0–47.0)
Hemoglobin: 12.2 g/dL (ref 12.0–16.0)
LYMPHS ABS: 0.9 10*3/uL — AB (ref 1.0–3.6)
Lymphocytes Relative: 19 %
MCH: 29.7 pg (ref 26.0–34.0)
MCHC: 33.8 g/dL (ref 32.0–36.0)
MCV: 87.8 fL (ref 80.0–100.0)
MONOS PCT: 33 %
Monocytes Absolute: 1.5 10*3/uL — ABNORMAL HIGH (ref 0.2–0.9)
Neutro Abs: 1.5 10*3/uL (ref 1.4–6.5)
Neutrophils Relative %: 33 %
PLATELETS: 115 10*3/uL — AB (ref 150–440)
RBC: 4.1 MIL/uL (ref 3.80–5.20)
RDW: 15.7 % — ABNORMAL HIGH (ref 11.5–14.5)
WBC: 4.6 10*3/uL (ref 3.6–11.0)

## 2018-07-27 LAB — COMPREHENSIVE METABOLIC PANEL
ALT: 18 U/L (ref 0–44)
AST: 17 U/L (ref 15–41)
Albumin: 3.6 g/dL (ref 3.5–5.0)
Alkaline Phosphatase: 50 U/L (ref 38–126)
Anion gap: 8 (ref 5–15)
BUN: 18 mg/dL (ref 8–23)
CHLORIDE: 105 mmol/L (ref 98–111)
CO2: 26 mmol/L (ref 22–32)
CREATININE: 0.85 mg/dL (ref 0.44–1.00)
Calcium: 9.6 mg/dL (ref 8.9–10.3)
GFR calc Af Amer: >60 mL/min (ref >60)
GFR calc non Af Amer: >60 mL/min (ref >60)
Glucose, Bld: 99 mg/dL (ref 70–99)
Potassium: 4.6 mmol/L (ref 3.5–5.1)
Sodium: 139 mmol/L (ref 135–145)
Total Bilirubin: 1.6 mg/dL — ABNORMAL HIGH (ref 0.3–1.2)
Total Protein: 6.7 g/dL (ref 6.5–8.1)

## 2018-07-27 MED ORDER — PROCHLORPERAZINE MALEATE 10 MG PO TABS
10.0000 mg | ORAL_TABLET | Freq: Once | ORAL | Status: AC
  Administered 2018-07-27: 10 mg via ORAL
  Filled 2018-07-27: qty 1

## 2018-07-27 MED ORDER — ACETAMINOPHEN 325 MG PO TABS
650.0000 mg | ORAL_TABLET | Freq: Once | ORAL | Status: AC
  Administered 2018-07-27: 650 mg via ORAL
  Filled 2018-07-27: qty 2

## 2018-07-27 MED ORDER — DENOSUMAB 120 MG/1.7ML ~~LOC~~ SOLN
120.0000 mg | Freq: Once | SUBCUTANEOUS | Status: AC
  Administered 2018-07-27: 120 mg via SUBCUTANEOUS
  Filled 2018-07-27: qty 1.7

## 2018-07-27 MED ORDER — SODIUM CHLORIDE 0.9 % IV SOLN
Freq: Once | INTRAVENOUS | Status: AC
  Administered 2018-07-27: 10:00:00 via INTRAVENOUS
  Filled 2018-07-27: qty 250

## 2018-07-27 MED ORDER — DIPHENHYDRAMINE HCL 25 MG PO CAPS
50.0000 mg | ORAL_CAPSULE | Freq: Once | ORAL | Status: AC
  Administered 2018-07-27: 50 mg via ORAL
  Filled 2018-07-27: qty 2

## 2018-07-27 MED ORDER — SODIUM CHLORIDE 0.9 % IV SOLN
1000.0000 mg | Freq: Once | INTRAVENOUS | Status: AC
  Administered 2018-07-27: 1000 mg via INTRAVENOUS
  Filled 2018-07-27: qty 40

## 2018-07-27 MED ORDER — SODIUM CHLORIDE 0.9 % IV SOLN
20.0000 mg | Freq: Once | INTRAVENOUS | Status: AC
  Administered 2018-07-27: 20 mg via INTRAVENOUS
  Filled 2018-07-27: qty 2

## 2018-07-27 MED ORDER — INFLUENZA VAC SPLIT HIGH-DOSE 0.5 ML IM SUSY
0.5000 mL | PREFILLED_SYRINGE | Freq: Once | INTRAMUSCULAR | Status: AC
  Administered 2018-07-27: 0.5 mL via INTRAMUSCULAR
  Filled 2018-07-27: qty 0.5

## 2018-07-27 NOTE — Progress Notes (Signed)
Hematology/Oncology Follow up note [facility] [phone] [phone]   Patient Care Team: McLean-Scocuzza, Cassie Glow, MD as PCP - General (Internal Medicine)  REFERRING PROVIDER: Dr. Baruch Jones, Cassie Jones  REASON FOR VISIT Follow up for treatment of multiple myeloma.   HISTORY OF PRESENTING ILLNESS:  Cassie Jones is a  72 y.o.  female with PMH listed below who was referred to me for evaluation of multiple myeloma.  Patient used to follow-up with local oncologist Dr. Tonia Jones and [facility] oncologist Dr. Amalia Jones.  Patient tells me that as her husband works every day and has a busy working schedule, she is not able to get transportation to her treatment.  As an alternative she is now living with her brother who is [facility] [place] and want to transfer care here. Extensive medical record review was performed.    Patient was diagnosed with IgG lambda, ISS2, t (4,14) multiple myeloma on February 03, 2017, she has hemoglobin of 9, calcium 8.9, LDH 132, beta microglobulin 4.6, albumin 3.2.  Skeletal survey negative.  PET scan with single nonspecific humeral lesion and a thyroid nodule.  Marrow with 60% plasmacytosis by CD138 IHC, FISH with hyperdiploidy t(4, 14), 1 q. gain.. Per note, she was treated with CyborD (M spike 3.6 at start)  from 01/2017 to 02/2017. Her treatment was switched to Revlimid Velcade dexamethasone from 02/2017, M spike 1.2 at start,and was found to have progression of disease in 09/2017, when her M spike increased to 1.8, and developed compression fracture in 10/2016. She was restarted on RVD using weekly Velcade.  She was recently seen by Dr. Boris Jones and recommended to start on a combination of daratumumab, Pomalyst, dexamethasone. Per patient she received her first daratumumab last week with split dose 40m/kg given on 3/14 and 3/15.  Patient reports that she tolerate the treatment well except mild infusion  reaction.  # Patient also reports that she has got dental clearance and has been given 1 dose of Zometa by Dr. VRemus Jones(unkown dates).  [facility]  # Patient was seen by nurse practitioner last week prior to her cycle 2 daratumumab.  She reports feeling shortness of breath, having cough, believed to have bronchitis and was prescribed Levaquin 500 mg p.o. daily for 7 days, and albuterol as needed.  She also had a VQ scan low probability for pulmonary embolism.  # 01/29/2018 5th weekly treatment of Daratumumab,  Cycle 2 Pomalyst was started for a week and was nterrupted due to acute bronchitis treated with outpatient Azithromycin.    # 02/16/2018 developed right upper extremity swelling and tenderness, confirmed to be DVT. She was taking Aspirin.  Started on Eliquis. Aspirin is stopped.  # 02/16/2018 6th Daratumumab treatment. Was started on Cycle 3 Pomalyst, she has completed about 2 weeks treatment. She was admitted to hospital on 02/21/2018 for Klebsiella UTI and oncology was not consulted. She was treatment with antibiotics and continued on Pomalyst. She was hospitalized again on 03/02/2018 for pneumonia, COPD exacerbation/emphasema and oncology was consulted and patient was seen by my colleague Cassie Jones and advise patient to stop Pomalyst.   # [facility] recommends dose reduced Pomalyst 379m This was previously discussed with patient, and she was reluctant to be restarted on Pomalyst. Today she seems to be more acceptable for restarting Pomalyst, but requests to start in a few weeks so that she can  have a nice break.   #Previously declined bone marrow transplant evaluation.    INTERVAL HISTORY Cassie Jones is a 72 y.o. female who has above history reviewed by me presents for assessment prior to antineoplastic immunotherapy and chemotherapy toxicity assessment.   # Back pain, better  controlled with oxycodone.  Patient reports that she will occasionally take pain medication with good symptom control.  #Fatigue: reports worsening fatigue. Chronic onset, perisistent, no aggravating or improving factors, no associated symptoms.  #UTI, patient was recently seen by primary care physician due to urinary symptoms, right flank pain.Marland Kitchen  UA on 07/13/2018 3+ leukocytes, negative nitrate.  Urine culture was positive for Proteus.  Patient was treated with a course of Augmentin.  Symptoms are relieved.  She had CBC done on 07/15/2018, she was not neutropenic.  ANC was 7. Today she denies any urinary symptoms.  Data reviewed:  Multiple myeloma labs obtained at last visit:  03/06/2018 SPEP showed VGPR with >90% reduction of her M protein. Normalized free light chain ratio. 05/04/2018 SPEP continued to show stable M protein, at 0.2g/L,   Current Treatment  # 3/14, 3/15 at outside facility, she got split dose for first treatment of daratumumab. #  01/08/2018 Start on weekly daratumumab 9m/m2, dexamethasone 2263m  S/p # Palliative RT to spine. 01/08/2018 started cycle 1 Pomalyst 63m90may 1-21 ogf 28 day cycle.  Pomalyst 4 mg was held after 3 cycles with multiple interruptions due to neutropenia and recurrent pneumonia. Pomalyst was restarted at a lower dose of 3 mg on 04/06/2018 [cycle 4] . This cycle of Pomalyst was  disrreupted for 4 days due to UTI. She was instructed to restart and was able to finish this cycle.   05/11/2018 Cycle 5 Pomalyst delayed one week as she needs to get deep dental clean before obtaining dental clearance for Xgeva.  06/01/2018  Xgeva.  06/08/2018 Cycle 6 Pomalyst 3mg39m/07/2018 Cycle 7 Pomalyst 3mg 57murrent Pain medication: started to take oxycodone 5mg P363m Review of Systems  Constitutional: Positive for malaise/fatigue. Negative for chills, fever and weight loss.  HENT: Negative for congestion, ear discharge, ear pain, hearing loss, nosebleeds, sinus pain and sore  throat.   Eyes: Negative for blurred vision, double vision, photophobia, discharge and redness.  Respiratory: Negative for cough, hemoptysis, sputum production, shortness of breath and wheezing.   Cardiovascular: Negative for chest pain, palpitations, orthopnea, claudication and leg swelling.  Gastrointestinal: Negative for abdominal pain, blood in stool, constipation, diarrhea, heartburn, melena, nausea and vomiting.  Genitourinary: Negative for dysuria, flank pain, frequency, hematuria and urgency.  Musculoskeletal: Negative for back pain, joint pain, myalgias and neck pain.  Skin: Negative for itching and rash.  Neurological: Negative for dizziness, tingling, tremors, sensory change, speech change, focal weakness, weakness and headaches.  Endo/Heme/Allergies: Negative for environmental allergies. Does not bruise/bleed easily.  Psychiatric/Behavioral: Negative for depression, hallucinations, substance abuse and suicidal ideas. The patient is not nervous/anxious.     MEDICAL HISTORY:  Past Medical History:  Diagnosis Date  . Ascites   . Asthma   . Bilateral leg edema   . Cancer ([place]CC)  [place]   . Colon polyps   . Diverticulitis    with perforation 02/2017 and hosp with colostomy bag s/p removal   . Drug-induced neutropenia (HCC) [place]/2019  . GERD (gastroesophageal reflux disease)   . Hyperlipidemia   . Hypertension   . Multiple myeloma (HCC)  [place] [facility]   . Pleural effusion   . Pneumonia    03/02/18   . Thyroid nodule   . UTI (urinary tract infection)   . Vitamin D deficiency     SURGICAL HISTORY: Past Surgical History:  Procedure Laterality Date  . ABDOMINAL HYSTERECTOMY    . BREAST BIOPSY Bilateral yrs ago   benign  . CHOLECYSTECTOMY    . COLOSTOMY  2018  . COLOSTOMY REVERSAL     11 2018  . KYPHOPLASTY     11/2017 [place]     SOCIAL HISTORY: Social History   Socioeconomic History  . Marital status: Married    Spouse  name: Not on file  . Number of children: Not on file  . Years of education: Not on file  . Highest education level: Not on file  Occupational History  . Not on file  Social Needs  . Financial resource strain: Not hard at all  . Food insecurity:    Worry: Never true    Inability: Never true  . Transportation needs:    Medical: No    Non-medical: No  Tobacco Use  . Smoking status: Former Research scientist (life sciences)  . Smokeless tobacco: Never Used  Substance and Sexual Activity  . Alcohol use: Not Currently  . Drug use: Never  . Sexual activity: Yes    Comment: husband   Lifestyle  . Physical activity:    Days per week: 0 days    Minutes per session: 0 min  . Stress: Not at all  Relationships  . Social connections:    Talks on phone: More than three times a week    Gets together: Once a week    Attends religious service: 1 to 4 times per year    Active member of club or organization: No    Attends meetings of clubs or organizations: Never    Relationship status: Married  . Intimate partner violence:    Fear of current or ex partner: No    Emotionally abused: No    Physically abused: No    Forced sexual activity: No  Other Topics Concern  . Not on file  Social History Narrative   Married    Husband lives in [place] while she gets treatment in this area    She lives alone in this area with family close by     FAMILY HISTORY: Family History  Problem Relation Age of Onset  . Breast cancer Mother 8  . Cancer Mother        breast   . Diabetes Mother   . Heart disease Mother   . Hyperlipidemia Mother   . Hypertension Mother   . Birth defects Brother   . Diabetes Brother   . Heart disease Brother   . Hyperlipidemia Brother   . Cancer Father        prostate met to liver     ALLERGIES:  is allergic to codeine.  MEDICATIONS:  Current Outpatient Medications  Medication Sig Dispense Refill  . acetaminophen (TYLENOL) 500 MG tablet Take 1,000 mg by mouth every 6 (six) hours as  needed for mild pain or fever.    Marland Kitchen albuterol (VENTOLIN HFA) 108 (90 Base) MCG/ACT inhaler Inhale 2 puffs into the lungs every 6 (six) hours as needed. 1 Inhaler 1  . amoxicillin-clavulanate (AUGMENTIN) 875-125 MG tablet Take 1 tablet by mouth 2 (two) times daily. With food 14 tablet 0  . apixaban (ELIQUIS) 5 MG TABS tablet Take 1 tablet (5 mg total)  by mouth 2 (two) times daily. 60 tablet 3  . calcium-vitamin D (OSCAL WITH D) 500-200 MG-UNIT tablet Take 1 tablet by mouth daily. 30 tablet 0  . cefUROXime (CEFTIN) 250 MG tablet Take 1 tablet (250 mg total) by mouth 2 (two) times daily with a meal. 20 tablet 0  . Cholecalciferol 50000 units capsule Take 1 capsule (50,000 Units total) by mouth once a week. 13 capsule 1  . dexamethasone (DECADRON) 4 MG tablet Take 5 tablets (20 mg total) by mouth once a week. 30 tablet 0  . feeding supplement, ENSURE ENLIVE, (ENSURE ENLIVE) LIQD Take 237 mLs by mouth 3 (three) times daily between meals. 237 mL 12  . fluticasone (FLONASE) 50 MCG/ACT nasal spray Place 1-2 sprays into both nostrils daily. (Patient taking differently: Place 1-2 sprays into both nostrils daily as needed. ) 16 g 2  . fluticasone (FLOVENT DISKUS) 50 MCG/BLIST diskus inhaler Inhale 1 puff into the lungs 2 (two) times daily. (Patient taking differently: Inhale 1 puff into the lungs 2 (two) times daily as needed. ) 1 Inhaler 3  . furosemide (LASIX) 20 MG tablet Take 1 tablet (20 mg total) by mouth daily as needed. 30 tablet 2  . loperamide (IMODIUM A-D) 2 MG tablet Take 2 mg by mouth 4 (four) times daily as needed for diarrhea or loose stools.    . montelukast (SINGULAIR) 10 MG tablet Take 10 mg by mouth at bedtime.    . ondansetron (ZOFRAN) 8 MG tablet Take 1 tablet (8 mg total) by mouth every 8 (eight) hours as needed for nausea. 30 tablet 0  . oxyCODONE (OXY IR/ROXICODONE) 5 MG immediate release tablet Take 1 tablet (5 mg total) by mouth every 8 (eight) hours as needed for moderate pain. 30  tablet 0  . pantoprazole (PROTONIX) 40 MG tablet Take 1 tablet (40 mg total) by mouth every morning. 30 minutes before food 90 tablet 1  . phenazopyridine (PYRIDIUM) 200 MG tablet Take 1 tablet (200 mg total) by mouth 3 (three) times daily as needed for pain. 10 tablet 0  . pomalidomide (POMALYST) 3 MG capsule Take 1 capsule (3 mg total) by mouth daily. Take with water on days 1-21. Repeat every 28 days. 21 capsule 0  . potassium chloride SA (K-DUR,KLOR-CON) 20 MEQ tablet Take 1 tablet (20 mEq total) by mouth daily. 30 tablet 2  . pravastatin (PRAVACHOL) 10 MG tablet Take 1 tablet by mouth at bedtime.    . prochlorperazine (COMPAZINE) 10 MG tablet Take 10 mg by mouth every 6 (six) hours as needed for nausea.    Marland Kitchen tiotropium (SPIRIVA) 18 MCG inhalation capsule Place 1 capsule (18 mcg total) into inhaler and inhale daily. 30 capsule 6   No current facility-administered medications for this visit.    Facility-Administered Medications Ordered in Other Visits  Medication Dose Route Frequency Provider Last Rate Last Dose  . montelukast (SINGULAIR) tablet 10 mg  10 mg Oral QHS Earlie Server, MD   10 mg at 04/20/18 1029     PHYSICAL EXAMINATION: ECOG PERFORMANCE STATUS: 1 - Symptomatic but completely ambulatory Vitals:   07/27/18 1049  BP: 126/70  Pulse: (!) 59  Resp: 16  Temp: (!) 96.9 F (36.1 C)   Filed Weights   07/27/18 1049  Weight: 140 lb 5 oz (63.6 kg)    Physical Exam  Constitutional: She is oriented to person, place, and time. No distress.  HENT:  Head: Normocephalic and atraumatic.  Nose: Nose normal.  Mouth/Throat: Oropharynx  is clear and moist. No oropharyngeal exudate.  Eyes: Pupils are equal, round, and reactive to light. EOM are normal. Left eye exhibits no discharge. No scleral icterus.  Neck: Normal range of motion. Neck supple. No JVD present. No tracheal deviation present.  Cardiovascular: Normal rate, regular rhythm and normal heart sounds.  No murmur  heard. Pulmonary/Chest: Effort normal and breath sounds normal. No respiratory distress. She has no wheezes. She has no rales. She exhibits no tenderness.  Decreased breath sounds bilaterally.   Abdominal: Soft. She exhibits no distension and no mass. There is no tenderness. There is no rebound and no guarding.  Musculoskeletal: Normal range of motion. She exhibits no edema or tenderness.  Lymphadenopathy:    She has no cervical adenopathy.  Neurological: She is alert and oriented to person, place, and time. No cranial nerve deficit. She exhibits normal muscle tone. Coordination normal.  Skin: Skin is warm and dry. No rash noted. She is not diaphoretic. No erythema. No pallor.  Psychiatric: Memory, affect and judgment normal.     LABORATORY DATA:  I have reviewed the data as listed Lab Results  Component Value Date   WBC 4.6 07/27/2018   HGB 12.2 07/27/2018   HCT 36.0 07/27/2018   MCV 87.8 07/27/2018   PLT 115 (L) 07/27/2018   Recent Labs    06/29/18 0904 07/15/18 0913 07/27/18 0815  NA 140 140 139  K 3.7 3.2* 4.6  CL 108 107 105  CO2 _0 GLUCOSE 109* 105* 99  BUN _1 CREATININE 0.74 0.65 0.85  CALCIUM 9.0 9.1 9.6  GFRNONAA >60 >60 >60  GFRAA >60 >60 >60  PROT 6.4* 6.5 6.7  ALBUMIN 3.7 3.5 3.6  AST _2 ALT _3 ALKPHOS 51 47 50  BILITOT 1.1 1.2 1.6*     Multiple myeloma panel showed stable disease.  M protein 0.2, IgG 550.  ASSESSMENT & PLAN:  1. Drug-induced neutropenia (HCC)   2. Bone metastasis ([place])   3. Multiple myeloma in relapse ([place])   4. Encounter for antineoplastic immunotherapy   5. Encounter for antineoplastic chemotherapy   6. Frequent UTI    #Multiple myeloma:   Labs reviewed and discussed with patient.  Counts acceptable proceeding with today's monthly maintenance daratumumab treatment. Rechecking multiple myeloma panel today.  Results pending. She has finished cycle 7 Pomalyst 3 mg Q1-21.  #Drug-induced neutropenia,  ANC 1.5 "Granix today.  Her counts appears adequate to start cycle 8 Pomalyst 3 mg in 1 week. #Bone metastasis, proceed with Xgeva today.  # DVT, on Pomalyst, continue Eliquis.  # Back pain, manageable with oxycodone 5 mg, she takes it occasionally. Bone scan was obtained after last visit.  Reviewed image and discussed with patient.  Bone scan showed no focal radiotracer accumulation within the axillary or appendicular skeleton to localize multiple myeloma.  Mild scoliosis of the lumbar spine. # Thrombocytopenia, grade 1, stable.  Continue to monitor..  # Frequent UTI. Symptoms resolved after treatment. Likely due to her compromised immunity and chemotherapy.    Orders Placed This Encounter  Procedures  . Multiple Myeloma Panel (SPEP&IFE w/QIG)    Standing Status:   Standing    Number of Occurrences:   15    Standing Expiration Date:   07/28/2019  . Kappa/lambda light chains    Standing Status:   Standing    Number of Occurrences:   15    Standing Expiration Date:   07/28/2019  Return of visit: 4 weeks for assessment prior to daratumumab.    Earlie Server, MD, PhD Hematology Oncology [facility] Pager- [phone] 07/27/2018

## 2018-07-28 LAB — KAPPA/LAMBDA LIGHT CHAINS
Kappa free light chain: 3.3 mg/L (ref 3.3–19.4)
Kappa, lambda light chain ratio: 0.05 — ABNORMAL LOW (ref 0.26–1.65)
LAMDA FREE LIGHT CHAINS: 61.3 mg/L — AB (ref 5.7–26.3)

## 2018-07-30 LAB — MULTIPLE MYELOMA PANEL, SERUM
ALBUMIN SERPL ELPH-MCNC: 3.1 g/dL (ref 2.9–4.4)
ALBUMIN/GLOB SERPL: 1.2 (ref 0.7–1.7)
ALPHA 1: 0.2 g/dL (ref 0.0–0.4)
Alpha2 Glob SerPl Elph-Mcnc: 0.9 g/dL (ref 0.4–1.0)
B-GLOBULIN SERPL ELPH-MCNC: 1 g/dL (ref 0.7–1.3)
Gamma Glob SerPl Elph-Mcnc: 0.6 g/dL (ref 0.4–1.8)
Globulin, Total: 2.8 g/dL (ref 2.2–3.9)
IGA: 7 mg/dL — AB (ref 64–422)
IgG (Immunoglobin G), Serum: 680 mg/dL — ABNORMAL LOW (ref 700–1600)
IgM (Immunoglobulin M), Srm: 22 mg/dL — ABNORMAL LOW (ref 26–217)
M PROTEIN SERPL ELPH-MCNC: 0.4 g/dL — AB
Total Protein ELP: 5.9 g/dL — ABNORMAL LOW (ref 6.0–8.5)

## 2018-08-18 ENCOUNTER — Other Ambulatory Visit: Payer: Self-pay | Admitting: *Deleted

## 2018-08-19 ENCOUNTER — Other Ambulatory Visit: Payer: Self-pay

## 2018-08-19 ENCOUNTER — Telehealth: Payer: Self-pay | Admitting: *Deleted

## 2018-08-19 MED ORDER — POMALIDOMIDE 3 MG PO CAPS: 3.0000 mg | ORAL_CAPSULE | Freq: Every day | ORAL | 0 refills | Status: DC

## 2018-08-19 NOTE — Telephone Encounter (Signed)
Medication refill approved and sent electronically. [facility] # [id number].

## 2018-08-19 NOTE — Telephone Encounter (Signed)
Pomylst 3 mg refill was faxed and they haven't heard anything back. Please advise. [place] [phone].

## 2018-08-23 ENCOUNTER — Telehealth: Payer: Self-pay | Admitting: *Deleted

## 2018-08-23 NOTE — Telephone Encounter (Signed)
Hospital called to report that patient was now in patient at [place] and would not be able to keep her appointment this week. Schedule message sent.

## 2018-08-24 ENCOUNTER — Telehealth: Payer: Self-pay | Admitting: *Deleted

## 2018-08-24 ENCOUNTER — Ambulatory Visit: Payer: Medicare PPO | Admitting: Oncology

## 2018-08-24 ENCOUNTER — Ambulatory Visit: Payer: Medicare PPO

## 2018-08-24 ENCOUNTER — Other Ambulatory Visit: Payer: Medicare PPO

## 2018-08-24 MED ORDER — GENERIC EXTERNAL MEDICATION
650.00 | Status: DC
Start: ? — End: 2018-08-24

## 2018-08-24 MED ORDER — ATORVASTATIN CALCIUM 10 MG PO TABS
10.00 | ORAL_TABLET | ORAL | Status: DC
Start: 2018-08-25 — End: 2018-08-24

## 2018-08-24 MED ORDER — PNEUMOCOCCAL VAC POLYVALENT 25 MCG/0.5ML IJ INJ
0.50 | INJECTION | INTRAMUSCULAR | Status: DC
Start: ? — End: 2018-08-24

## 2018-08-24 MED ORDER — SODIUM CHLORIDE 0.9 % IV SOLN
75.00 | INTRAVENOUS | Status: DC
Start: ? — End: 2018-08-24

## 2018-08-24 MED ORDER — RISAQUAD PO CAPS
1.00 | ORAL_CAPSULE | ORAL | Status: DC
Start: 2018-08-25 — End: 2018-08-24

## 2018-08-24 MED ORDER — MONTELUKAST SODIUM 10 MG PO TABS
10.00 | ORAL_TABLET | ORAL | Status: DC
Start: 2018-08-25 — End: 2018-08-24

## 2018-08-24 MED ORDER — PANTOPRAZOLE SODIUM 40 MG PO TBEC
40.00 | DELAYED_RELEASE_TABLET | ORAL | Status: DC
Start: 2018-08-25 — End: 2018-08-24

## 2018-08-24 MED ORDER — HYDROMORPHONE HCL 2 MG/ML IJ SOLN
0.20 | INTRAMUSCULAR | Status: DC
Start: ? — End: 2018-08-24

## 2018-08-24 MED ORDER — CEFTRIAXONE SODIUM 1 G IJ SOLR
1.00 | INTRAMUSCULAR | Status: DC
Start: 2018-08-24 — End: 2018-08-24

## 2018-08-24 MED ORDER — APIXABAN 5 MG PO TABS
5.00 | ORAL_TABLET | ORAL | Status: DC
Start: 2018-08-24 — End: 2018-08-24

## 2018-08-24 MED ORDER — ALBUTEROL SULFATE (5 MG/ML) 0.5% IN NEBU
2.50 | INHALATION_SOLUTION | RESPIRATORY_TRACT | Status: DC
Start: ? — End: 2018-08-24

## 2018-08-24 MED ORDER — ALPRAZOLAM 0.5 MG PO TABS
.50 | ORAL_TABLET | ORAL | Status: DC
Start: ? — End: 2018-08-24

## 2018-08-24 NOTE — Telephone Encounter (Signed)
Patient scheduled for chemotherapy today and is in their hospital. Need a call back to reschedule appointment.

## 2018-08-24 NOTE — Telephone Encounter (Signed)
We cant reschedule until we know when she is d/c from the hospital

## 2018-08-24 NOTE — Telephone Encounter (Signed)
Thank you Hassan Rowan. I'm waiting to hear from [place] on this.

## 2018-08-24 NOTE — Telephone Encounter (Signed)
Called pt and notified her to call us when she is d/c

## 2018-08-25 ENCOUNTER — Telehealth: Payer: Self-pay | Admitting: *Deleted

## 2018-08-25 NOTE — Telephone Encounter (Signed)
Message sent to scheduling to reschedule appointment.

## 2018-08-25 NOTE — Telephone Encounter (Signed)
Patient called to report that she is out of the hospital and needs to reschedule her chemotherapy appointment. States Cassie Jones asked her to call once discharged from hospital

## 2018-08-30 ENCOUNTER — Inpatient Hospital Stay: Payer: Medicare PPO

## 2018-08-30 ENCOUNTER — Inpatient Hospital Stay: Payer: Medicare PPO | Attending: Oncology | Admitting: Oncology

## 2018-08-30 ENCOUNTER — Other Ambulatory Visit: Payer: Self-pay

## 2018-08-30 ENCOUNTER — Encounter: Payer: Self-pay | Admitting: Oncology

## 2018-08-30 VITALS — BP 115/69 | HR 71 | Temp 96.8°F | Resp 18 | Wt 144.7 lb

## 2018-08-30 DIAGNOSIS — N390 Urinary tract infection, site not specified: Secondary | ICD-10-CM

## 2018-08-30 DIAGNOSIS — R5382 Chronic fatigue, unspecified: Secondary | ICD-10-CM | POA: Insufficient documentation

## 2018-08-30 DIAGNOSIS — M810 Age-related osteoporosis without current pathological fracture: Secondary | ICD-10-CM | POA: Diagnosis not present

## 2018-08-30 DIAGNOSIS — M549 Dorsalgia, unspecified: Secondary | ICD-10-CM | POA: Diagnosis not present

## 2018-08-30 DIAGNOSIS — C9002 Multiple myeloma in relapse: Secondary | ICD-10-CM | POA: Diagnosis not present

## 2018-08-30 DIAGNOSIS — Z86718 Personal history of other venous thrombosis and embolism: Secondary | ICD-10-CM | POA: Diagnosis not present

## 2018-08-30 DIAGNOSIS — Z5111 Encounter for antineoplastic chemotherapy: Secondary | ICD-10-CM

## 2018-08-30 DIAGNOSIS — Z79899 Other long term (current) drug therapy: Secondary | ICD-10-CM

## 2018-08-30 DIAGNOSIS — Z5112 Encounter for antineoplastic immunotherapy: Secondary | ICD-10-CM | POA: Diagnosis present

## 2018-08-30 DIAGNOSIS — I10 Essential (primary) hypertension: Secondary | ICD-10-CM | POA: Insufficient documentation

## 2018-08-30 DIAGNOSIS — Z87440 Personal history of urinary (tract) infections: Secondary | ICD-10-CM

## 2018-08-30 DIAGNOSIS — K219 Gastro-esophageal reflux disease without esophagitis: Secondary | ICD-10-CM | POA: Insufficient documentation

## 2018-08-30 DIAGNOSIS — E876 Hypokalemia: Secondary | ICD-10-CM

## 2018-08-30 DIAGNOSIS — Z87891 Personal history of nicotine dependence: Secondary | ICD-10-CM | POA: Insufficient documentation

## 2018-08-30 DIAGNOSIS — R600 Localized edema: Secondary | ICD-10-CM | POA: Diagnosis not present

## 2018-08-30 DIAGNOSIS — Z7901 Long term (current) use of anticoagulants: Secondary | ICD-10-CM | POA: Diagnosis not present

## 2018-08-30 DIAGNOSIS — Z8042 Family history of malignant neoplasm of prostate: Secondary | ICD-10-CM

## 2018-08-30 DIAGNOSIS — J449 Chronic obstructive pulmonary disease, unspecified: Secondary | ICD-10-CM | POA: Diagnosis not present

## 2018-08-30 DIAGNOSIS — R5381 Other malaise: Secondary | ICD-10-CM | POA: Insufficient documentation

## 2018-08-30 DIAGNOSIS — E785 Hyperlipidemia, unspecified: Secondary | ICD-10-CM

## 2018-08-30 DIAGNOSIS — C7951 Secondary malignant neoplasm of bone: Secondary | ICD-10-CM

## 2018-08-30 DIAGNOSIS — Z803 Family history of malignant neoplasm of breast: Secondary | ICD-10-CM | POA: Insufficient documentation

## 2018-08-30 DIAGNOSIS — D702 Other drug-induced agranulocytosis: Secondary | ICD-10-CM

## 2018-08-30 LAB — CBC WITH DIFFERENTIAL/PLATELET
ABS IMMATURE GRANULOCYTES: 0.02 10*3/uL (ref 0.00–0.07)
Basophils Absolute: 0 10*3/uL (ref 0.0–0.1)
Basophils Relative: 1 %
EOS PCT: 3 %
Eosinophils Absolute: 0.1 10*3/uL (ref 0.0–0.5)
HEMATOCRIT: 33.1 % — AB (ref 36.0–46.0)
HEMOGLOBIN: 10.5 g/dL — AB (ref 12.0–15.0)
Immature Granulocytes: 0 %
LYMPHS ABS: 1.3 10*3/uL (ref 0.7–4.0)
LYMPHS PCT: 25 %
MCH: 28.9 pg (ref 26.0–34.0)
MCHC: 31.7 g/dL (ref 30.0–36.0)
MCV: 91.2 fL (ref 80.0–100.0)
MONO ABS: 1 10*3/uL (ref 0.1–1.0)
MONOS PCT: 20 %
NEUTROS ABS: 2.5 10*3/uL (ref 1.7–7.7)
Neutrophils Relative %: 51 %
Platelets: 210 10*3/uL (ref 150–400)
RBC: 3.63 MIL/uL — AB (ref 3.87–5.11)
RDW: 15.2 % (ref 11.5–15.5)
WBC: 5 10*3/uL (ref 4.0–10.5)
nRBC: 0 % (ref 0.0–0.2)

## 2018-08-30 LAB — COMPREHENSIVE METABOLIC PANEL
ALBUMIN: 3.4 g/dL — AB (ref 3.5–5.0)
ALT: 20 U/L (ref 0–44)
AST: 20 U/L (ref 15–41)
Alkaline Phosphatase: 46 U/L (ref 38–126)
Anion gap: 8 (ref 5–15)
BUN: 9 mg/dL (ref 8–23)
CHLORIDE: 105 mmol/L (ref 98–111)
CO2: 26 mmol/L (ref 22–32)
CREATININE: 0.79 mg/dL (ref 0.44–1.00)
Calcium: 8.7 mg/dL — ABNORMAL LOW (ref 8.9–10.3)
GFR calc Af Amer: >60 mL/min (ref >60)
GFR calc non Af Amer: >60 mL/min (ref >60)
GLUCOSE: 154 mg/dL — AB (ref 70–99)
POTASSIUM: 3 mmol/L — AB (ref 3.5–5.1)
Sodium: 139 mmol/L (ref 135–145)
Total Bilirubin: 0.9 mg/dL (ref 0.3–1.2)
Total Protein: 6.6 g/dL (ref 6.5–8.1)

## 2018-08-30 MED ORDER — ACETAMINOPHEN 325 MG PO TABS
650.0000 mg | ORAL_TABLET | Freq: Once | ORAL | Status: AC
  Administered 2018-08-30: 650 mg via ORAL
  Filled 2018-08-30: qty 2

## 2018-08-30 MED ORDER — POTASSIUM CHLORIDE CRYS ER 10 MEQ PO TBCR
20.0000 meq | EXTENDED_RELEASE_TABLET | Freq: Once | ORAL | Status: AC
  Administered 2018-08-30: 20 meq via ORAL
  Filled 2018-08-30: qty 2

## 2018-08-30 MED ORDER — SODIUM CHLORIDE 0.9 % IV SOLN
1000.0000 mg | Freq: Once | INTRAVENOUS | Status: AC
  Administered 2018-08-30: 1000 mg via INTRAVENOUS
  Filled 2018-08-30: qty 50

## 2018-08-30 MED ORDER — POTASSIUM CHLORIDE 10 MEQ/100ML IV SOLN: 10.0000 meq | Freq: Once | INTRAVENOUS | Status: DC

## 2018-08-30 MED ORDER — DENOSUMAB 120 MG/1.7ML ~~LOC~~ SOLN
120.0000 mg | Freq: Once | SUBCUTANEOUS | Status: AC
  Administered 2018-08-30: 120 mg via SUBCUTANEOUS
  Filled 2018-08-30: qty 1.7

## 2018-08-30 MED ORDER — DIPHENHYDRAMINE HCL 25 MG PO CAPS
50.0000 mg | ORAL_CAPSULE | Freq: Once | ORAL | Status: AC
  Administered 2018-08-30: 50 mg via ORAL
  Filled 2018-08-30: qty 2

## 2018-08-30 MED ORDER — PROCHLORPERAZINE MALEATE 10 MG PO TABS
10.0000 mg | ORAL_TABLET | Freq: Once | ORAL | Status: AC
  Administered 2018-08-30: 10 mg via ORAL
  Filled 2018-08-30: qty 1

## 2018-08-30 MED ORDER — SODIUM CHLORIDE 0.9 % IV SOLN
20.0000 mg | Freq: Once | INTRAVENOUS | Status: AC
  Administered 2018-08-30: 20 mg via INTRAVENOUS
  Filled 2018-08-30: qty 2

## 2018-08-30 MED ORDER — SODIUM CHLORIDE 0.9 % IV SOLN
Freq: Once | INTRAVENOUS | Status: AC
  Administered 2018-08-30: 10:00:00 via INTRAVENOUS
  Filled 2018-08-30: qty 100

## 2018-08-30 MED ORDER — SODIUM CHLORIDE 0.9 % IV SOLN
Freq: Once | INTRAVENOUS | Status: AC
  Administered 2018-08-30: 10:00:00 via INTRAVENOUS
  Filled 2018-08-30: qty 250

## 2018-08-30 MED ORDER — DEXAMETHASONE 4 MG PO TABS: 20.0000 mg | ORAL_TABLET | ORAL | 2 refills | Status: DC

## 2018-08-30 NOTE — Progress Notes (Signed)
Hematology/Oncology Follow up note [facility] [phone] [phone]   Patient Care Team: McLean-Scocuzza, Nino Glow, MD as PCP - General (Internal Medicine)  REFERRING PROVIDER: Dr. Baruch Gouty, Donette  REASON FOR VISIT Follow up for treatment of multiple myeloma.   HISTORY OF PRESENTING ILLNESS:  Cassie Jones is a  72 y.o.  female with PMH listed below who was referred to me for evaluation of multiple myeloma.  Patient used to follow-up with local oncologist Dr. Tonia Brooms and [facility] oncologist Dr. Amalia Hailey.  Patient tells me that as her husband works every day and has a busy working schedule, she is not able to get transportation to her treatment.  As an alternative she is now living with her brother who is close to [facility] [place] and want to transfer care here. Extensive medical record review was performed.    Patient was diagnosed with IgG lambda, ISS2, t (4,14) multiple myeloma on February 03, 2017, she has hemoglobin of 9, calcium 8.9, LDH 132, beta microglobulin 4.6, albumin 3.2.  Skeletal survey negative.  PET scan with single nonspecific humeral lesion and a thyroid nodule.  Marrow with 60% plasmacytosis by CD138 IHC, FISH with hyperdiploidy t(4, 14), 1 q. gain.. Per note, she was treated with CyborD (M spike 3.6 at start)  from 01/2017 to 02/2017. Her treatment was switched to Revlimid Velcade dexamethasone from 02/2017, M spike 1.2 at start,and was found to have progression of disease in 09/2017, when her M spike increased to 1.8, and developed compression fracture in 10/2016. She was restarted on RVD using weekly Velcade.  She was recently seen by Dr. Boris Sharper and recommended to start on a combination of daratumumab, Pomalyst, dexamethasone. Per patient she received her first daratumumab last week with split dose 40m/kg given on 3/14 and 3/15.  Patient reports that she tolerate the treatment well except mild infusion  reaction.  # Patient also reports that she has got dental clearance and has been given 1 dose of Zometa by Dr. VRemus Loffler(unkown dates).  [facility]  # Patient was seen by nurse practitioner last week prior to her cycle 2 daratumumab.  She reports feeling shortness of breath, having cough, believed to have bronchitis and was prescribed Levaquin 500 mg p.o. daily for 7 days, and albuterol as needed.  She also had a VQ scan low probability for pulmonary embolism.  # 01/29/2018 5th weekly treatment of Daratumumab,  Cycle 2 Pomalyst was started for a week and was nterrupted due to acute bronchitis treated with outpatient Azithromycin.    # 02/16/2018 developed right upper extremity swelling and tenderness, confirmed to be DVT. She was taking Aspirin.  Started on Eliquis. Aspirin is stopped.  # 02/16/2018 6th Daratumumab treatment. Was started on Cycle 3 Pomalyst, she has completed about 2 weeks treatment. She was admitted to hospital on 02/21/2018 for Klebsiella UTI and oncology was not consulted. She was treatment with antibiotics and continued on Pomalyst. She was hospitalized again on 03/02/2018 for pneumonia, COPD exacerbation/emphasema and oncology was consulted and patient was seen by my colleague Dr.Finnergan and advise patient to stop Pomalyst.   # [facility] recommends dose reduced Pomalyst 379m This was previously discussed with patient, and she was reluctant to be restarted on Pomalyst. Today she seems to be more acceptable for restarting Pomalyst, but requests to start in a few weeks so that she can  have a nice break.   #Previously declined bone marrow transplant evaluation.    INTERVAL HISTORY Cassie Jones is a 72 y.o. female who has above history reviewed by me presents for assessment prior to antineoplastic immunotherapy and chemotherapy toxicity assessment.  #Recent UTI, patient  was recently admitted to [facility] due to dysuria, increased frequency of urination and pain in the bladder area.  She also reported right flank pain.  Patient was given IV Rocephin for possible pyelonephritis.  Renal ultrasound did not show any kidney stone.  Urine culture showed pansensitive E. coli.  Patient was discharged on a course of oral ciprofloxacin. Denies any fever, chills, today she has no urinary symptoms.  #Fatigue, reports feeling chronically fatigued.  Aggravating or improving factors. #Back pain, stable.  She takes oxycodone occasionally.  Good symptom control. Chronic lateral feet edema.  Left worse than right.  #History of extremity DVT Eliquis 5 mg twice daily. Data reviewed:  Multiple myeloma labs obtained at last visit:  03/06/2018 SPEP showed VGPR with >90% reduction of her M protein. Normalized free light chain ratio. 05/04/2018 SPEP continued to show stable M protein, at 0.2g/L,   Current Treatment  # 3/14, 3/15 at outside facility, she got split dose for first treatment of daratumumab. #  01/08/2018 Start on weekly daratumumab '16mg'$ /m2, dexamethasone '20mg'$ ,  S/p # Palliative RT to spine. 01/08/2018 started cycle 1 Pomalyst '4mg'$  Day 1-21 ogf 28 day cycle.  Pomalyst 4 mg was held after 3 cycles with multiple interruptions due to neutropenia and recurrent pneumonia. Pomalyst was restarted at a lower dose of 3 mg on 04/06/2018 [cycle 4] . This cycle of Pomalyst was  disrreupted for 4 days due to UTI. She was instructed to restart and was able to finish this cycle.   05/11/2018 Cycle 5 Pomalyst delayed one week as she needs to get deep dental clean before obtaining dental clearance for Xgeva.  06/01/2018  Xgeva.  06/08/2018 Cycle 6 Pomalyst '3mg'$   06/29/2018 Cycle 7 Pomalyst '3mg'$   08/03/2018 Cycle 8 Plmalyst '3mg'$    Current Pain medication: started to take oxycodone '5mg'$  PRN  Review of Systems  Constitutional: Positive for malaise/fatigue. Negative for chills, fever and  weight loss.  HENT: Negative for congestion, ear discharge, ear pain, hearing loss, nosebleeds, sinus pain and sore throat.   Eyes: Negative for blurred vision, double vision, photophobia, discharge and redness.  Respiratory: Negative for cough, hemoptysis, sputum production, shortness of breath and wheezing.   Cardiovascular: Positive for leg swelling. Negative for chest pain, palpitations, orthopnea and claudication.  Gastrointestinal: Negative for abdominal pain, blood in stool, constipation, diarrhea, heartburn, melena, nausea and vomiting.  Genitourinary: Negative for dysuria, flank pain, frequency, hematuria and urgency.  Musculoskeletal: Negative for back pain, joint pain, myalgias and neck pain.  Skin: Negative for itching and rash.  Neurological: Negative for dizziness, tingling, tremors, sensory change, speech change, focal weakness, weakness and headaches.  Endo/Heme/Allergies: Negative for environmental allergies. Does not bruise/bleed easily.  Psychiatric/Behavioral: Negative for depression, hallucinations, substance abuse and suicidal ideas. The patient is not nervous/anxious.     MEDICAL HISTORY:  Past Medical History:  Diagnosis Date  . Ascites   . Asthma   . Bilateral leg edema   . Cancer ([place])   . Chicken pox   . Colon polyps   . Diverticulitis    with perforation 02/2017 and hosp with colostomy bag s/p removal   . Drug-induced neutropenia ([place]) 03/09/2018  . GERD (gastroesophageal reflux disease)   . Hyperlipidemia   .  Hypertension   . Multiple myeloma ([place])    dx'ed 01/2018 follows Dr. Tasia Catchings and [facility] H/O   . Pleural effusion   . Pneumonia    03/02/18   . Thyroid nodule   . UTI (urinary tract infection)   . Vitamin D deficiency     SURGICAL HISTORY: Past Surgical History:  Procedure Laterality Date  . ABDOMINAL HYSTERECTOMY    . BREAST BIOPSY Bilateral yrs ago   benign  . CHOLECYSTECTOMY    . COLOSTOMY  2018  . COLOSTOMY REVERSAL     11 2018  . KYPHOPLASTY      11/2017 [place]     SOCIAL HISTORY: Social History   Socioeconomic History  . Marital status: Married    Spouse name: Not on file  . Number of children: Not on file  . Years of education: Not on file  . Highest education level: Not on file  Occupational History  . Not on file  Social Needs  . Financial resource strain: Not hard at all  . Food insecurity:    Worry: Never true    Inability: Never true  . Transportation needs:    Medical: No    Non-medical: No  Tobacco Use  . Smoking status: Former Research scientist (life sciences)  . Smokeless tobacco: Never Used  Substance and Sexual Activity  . Alcohol use: Not Currently  . Drug use: Never  . Sexual activity: Yes    Comment: husband   Lifestyle  . Physical activity:    Days per week: 0 days    Minutes per session: 0 min  . Stress: Not at all  Relationships  . Social connections:    Talks on phone: More than three times a week    Gets together: Once a week    Attends religious service: 1 to 4 times per year    Active member of club or organization: No    Attends meetings of clubs or organizations: Never    Relationship status: Married  . Intimate partner violence:    Fear of current or ex partner: No    Emotionally abused: No    Physically abused: No    Forced sexual activity: No  Other Topics Concern  . Not on file  Social History Narrative   Married    Husband lives in [place] while she gets treatment in this area    She lives alone in this area with family close by     FAMILY HISTORY: Family History  Problem Relation Age of Onset  . Breast cancer Mother 33  . Cancer Mother        breast   . Diabetes Mother   . Heart disease Mother   . Hyperlipidemia Mother   . Hypertension Mother   . Birth defects Brother   . Diabetes Brother   . Heart disease Brother   . Hyperlipidemia Brother   . Cancer Father        prostate met to liver     ALLERGIES:  is allergic to codeine.  MEDICATIONS:  Current Outpatient  Medications  Medication Sig Dispense Refill  . acetaminophen (TYLENOL) 500 MG tablet Take 1,000 mg by mouth every 6 (six) hours as needed for mild pain or fever.    Marland Kitchen albuterol (VENTOLIN HFA) 108 (90 Base) MCG/ACT inhaler Inhale 2 puffs into the lungs every 6 (six) hours as needed. 1 Inhaler 1  . apixaban (ELIQUIS) 5 MG TABS tablet Take 1 tablet (5 mg total) by mouth 2 (two)  times daily. 60 tablet 3  . calcium-vitamin D (OSCAL WITH D) 500-200 MG-UNIT tablet Take 1 tablet by mouth daily. 30 tablet 0  . Cholecalciferol 50000 units capsule Take 1 capsule (50,000 Units total) by mouth once a week. 13 capsule 1  . dexamethasone (DECADRON) 4 MG tablet Take 5 tablets (20 mg total) by mouth once a week. 30 tablet 0  . feeding supplement, ENSURE ENLIVE, (ENSURE ENLIVE) LIQD Take 237 mLs by mouth 3 (three) times daily between meals. 237 mL 12  . fluticasone (FLONASE) 50 MCG/ACT nasal spray Place 1-2 sprays into both nostrils daily. (Patient taking differently: Place 1-2 sprays into both nostrils daily as needed. ) 16 g 2  . fluticasone (FLOVENT DISKUS) 50 MCG/BLIST diskus inhaler Inhale 1 puff into the lungs 2 (two) times daily. (Patient taking differently: Inhale 1 puff into the lungs 2 (two) times daily as needed. ) 1 Inhaler 3  . furosemide (LASIX) 20 MG tablet Take 1 tablet (20 mg total) by mouth daily as needed. 30 tablet 2  . loperamide (IMODIUM A-D) 2 MG tablet Take 2 mg by mouth 4 (four) times daily as needed for diarrhea or loose stools.    . montelukast (SINGULAIR) 10 MG tablet Take 10 mg by mouth at bedtime.    . ondansetron (ZOFRAN) 8 MG tablet Take 1 tablet (8 mg total) by mouth every 8 (eight) hours as needed for nausea. 30 tablet 0  . oxyCODONE (OXY IR/ROXICODONE) 5 MG immediate release tablet Take 1 tablet (5 mg total) by mouth every 8 (eight) hours as needed for moderate pain. 30 tablet 0  . pantoprazole (PROTONIX) 40 MG tablet Take 1 tablet (40 mg total) by mouth every morning. 30 minutes  before food 90 tablet 1  . phenazopyridine (PYRIDIUM) 200 MG tablet Take 1 tablet (200 mg total) by mouth 3 (three) times daily as needed for pain. 10 tablet 0  . pomalidomide (POMALYST) 3 MG capsule Take 1 capsule (3 mg total) by mouth daily. Take with water on days 1-21. Repeat every 28 days. 21 capsule 0  . potassium chloride SA (K-DUR,KLOR-CON) 20 MEQ tablet Take 1 tablet (20 mEq total) by mouth daily. 30 tablet 2  . pravastatin (PRAVACHOL) 10 MG tablet Take 1 tablet by mouth at bedtime.    . prochlorperazine (COMPAZINE) 10 MG tablet Take 10 mg by mouth every 6 (six) hours as needed for nausea.    Marland Kitchen spironolactone (ALDACTONE) 25 MG tablet Take 25 mg by mouth daily.    Marland Kitchen tiotropium (SPIRIVA) 18 MCG inhalation capsule Place 1 capsule (18 mcg total) into inhaler and inhale daily. 30 capsule 6  . amoxicillin-clavulanate (AUGMENTIN) 875-125 MG tablet Take 1 tablet by mouth 2 (two) times daily. With food (Patient not taking: Reported on 08/30/2018) 14 tablet 0  . cefUROXime (CEFTIN) 250 MG tablet Take 1 tablet (250 mg total) by mouth 2 (two) times daily with a meal. (Patient not taking: Reported on 08/30/2018) 20 tablet 0  . ciprofloxacin (CIPRO) 500 MG tablet      No current facility-administered medications for this visit.    Facility-Administered Medications Ordered in Other Visits  Medication Dose Route Frequency Provider Last Rate Last Dose  . montelukast (SINGULAIR) tablet 10 mg  10 mg Oral QHS Earlie Server, MD   10 mg at 04/20/18 1029     PHYSICAL EXAMINATION: ECOG PERFORMANCE STATUS: 1 - Symptomatic but completely ambulatory Vitals:   08/30/18 0848  BP: 115/69  Pulse: 71  Resp: 18  Temp: (!) 96.8 F (36 C)   Filed Weights   08/30/18 0848  Weight: 144 lb 11.2 oz (65.6 kg)    Physical Exam  Constitutional: She is oriented to person, place, and time. No distress.  HENT:  Head: Normocephalic and atraumatic.  Nose: Nose normal.  Mouth/Throat: Oropharynx is clear and moist. No  oropharyngeal exudate.  Eyes: Pupils are equal, round, and reactive to light. EOM are normal. Left eye exhibits no discharge. No scleral icterus.  Neck: Normal range of motion. Neck supple. No JVD present. No tracheal deviation present.  Cardiovascular: Normal rate, regular rhythm and normal heart sounds.  No murmur heard. Pulmonary/Chest: Effort normal and breath sounds normal. No respiratory distress. She has no wheezes. She has no rales. She exhibits no tenderness.  Decreased breath sounds bilaterally.   Abdominal: Soft. She exhibits no distension and no mass. There is no tenderness. There is no rebound and no guarding.  Musculoskeletal: Normal range of motion. She exhibits edema. She exhibits no tenderness.  Lymphadenopathy:    She has no cervical adenopathy.  Neurological: She is alert and oriented to person, place, and time. No cranial nerve deficit. She exhibits normal muscle tone. Coordination normal.  Skin: Skin is warm and dry. No rash noted. She is not diaphoretic. No erythema. No pallor.  Psychiatric: Affect normal.     LABORATORY DATA:  I have reviewed the data as listed Lab Results  Component Value Date   WBC 5.0 08/30/2018   HGB 10.5 (L) 08/30/2018   HCT 33.1 (L) 08/30/2018   MCV 91.2 08/30/2018   PLT 210 08/30/2018   Recent Labs    06/29/18 0904 07/15/18 0913 07/27/18 0815  NA 140 140 139  K 3.7 3.2* 4.6  CL 108 107 105  CO2 '25 26 26  '$ GLUCOSE 109* 105* 99  BUN '11 15 18  '$ CREATININE 0.74 0.65 0.85  CALCIUM 9.0 9.1 9.6  GFRNONAA >60 >60 >60  GFRAA >60 >60 >60  PROT 6.4* 6.5 6.7  ALBUMIN 3.7 3.5 3.6  AST '17 15 17  '$ ALT '19 15 18  '$ ALKPHOS 51 47 50  BILITOT 1.1 1.2 1.6*   RADIOGRAPHIC STUDIES: I have personally reviewed the radiological images as listed and agreed with the findings in the report. 08/23/2018  US KIDNEY Unremarkable ultrasound examination of the kidneys, bladder and retroperitoneum  07/09/2018 BONE SCAN No focal radiotracer accumulation  within the axillary or appendicular skeleton to localize multiple myeloma. Mild scoliosis of the lumbar spine. IMPRESSION: No evidence of multiple myeloma.   ASSESSMENT & PLAN:  1. Multiple myeloma in relapse ([place])   2. Encounter for antineoplastic immunotherapy   3. Frequent UTI   4. Drug-induced neutropenia ([facility])   5. Encounter for antineoplastic chemotherapy    #Multiple myeloma:   Labs reviewed and discussed with patient.  Acceptable to proceed today's monthly maintenance daratumumab treatment. Her hemoglobin has decreased 12.2 to 10.5 most likely due to recent infection blood draw during her admission. Continue monitor. Today's multiple myeloma panel is pending. Panel that was done during last visit show slightly increased M protein 0.4  Regarding her cycle 9 Pomalyst 3 mg D1-21, given her recent UTI, recommend her to postpone starting. She may start on 09/06/2018 for cycle 9 Pomalyst  Repeat CBC 09/28/2018, will give Granix on day 22.  #Drug-induced neutropenia, no neutropenia today #Bone metastasis, proceed with Xgeva today. #Back pain, manageable with oxycodone 5 mg as needed.  She takes occasionally. #Frequent UTI, symptoms resolved after treatment.  Likely due to her last immunity and chemotherapy. Advised patient to finish outpatient course of Cipro   Return of visit: 4 weeks for assessment prior to daratumumab/GRANIX.   Earlie Server, MD, PhD Hematology Oncology [facility] Pager- [phone] 08/30/2018

## 2018-08-30 NOTE — Progress Notes (Signed)
Patient here for follow up. Continues on antibiotic for UTI. Requesting refill for dexamethasone.

## 2018-08-31 LAB — KAPPA/LAMBDA LIGHT CHAINS
KAPPA FREE LGHT CHN: 1.4 mg/L — AB (ref 3.3–19.4)
Kappa, lambda light chain ratio: 0.01 — ABNORMAL LOW (ref 0.26–1.65)
LAMDA FREE LIGHT CHAINS: 93.5 mg/L — AB (ref 5.7–26.3)

## 2018-09-02 LAB — MULTIPLE MYELOMA PANEL, SERUM
Albumin SerPl Elph-Mcnc: 3.1 g/dL (ref 2.9–4.4)
Albumin/Glob SerPl: 1.2 (ref 0.7–1.7)
Alpha 1: 0.2 g/dL (ref 0.0–0.4)
Alpha2 Glob SerPl Elph-Mcnc: 0.8 g/dL (ref 0.4–1.0)
B-GLOBULIN SERPL ELPH-MCNC: 0.8 g/dL (ref 0.7–1.3)
GAMMA GLOB SERPL ELPH-MCNC: 0.7 g/dL (ref 0.4–1.8)
GLOBULIN, TOTAL: 2.6 g/dL (ref 2.2–3.9)
IgA: 6 mg/dL — ABNORMAL LOW (ref 64–422)
IgG (Immunoglobin G), Serum: 994 mg/dL (ref 700–1600)
IgM (Immunoglobulin M), Srm: 9 mg/dL — ABNORMAL LOW (ref 26–217)
M PROTEIN SERPL ELPH-MCNC: 0.6 g/dL — AB
Total Protein ELP: 5.7 g/dL — ABNORMAL LOW (ref 6.0–8.5)

## 2018-09-09 ENCOUNTER — Inpatient Hospital Stay: Payer: Medicare PPO | Admitting: Internal Medicine

## 2018-09-11 ENCOUNTER — Other Ambulatory Visit: Payer: Self-pay | Admitting: Oncology

## 2018-09-28 ENCOUNTER — Inpatient Hospital Stay (HOSPITAL_BASED_OUTPATIENT_CLINIC_OR_DEPARTMENT_OTHER): Payer: Medicare PPO | Admitting: Oncology

## 2018-09-28 ENCOUNTER — Encounter: Payer: Self-pay | Admitting: Oncology

## 2018-09-28 ENCOUNTER — Inpatient Hospital Stay: Payer: Medicare PPO

## 2018-09-28 ENCOUNTER — Inpatient Hospital Stay: Payer: Medicare PPO | Attending: Oncology

## 2018-09-28 ENCOUNTER — Other Ambulatory Visit: Payer: Self-pay

## 2018-09-28 VITALS — BP 119/69 | HR 66 | Temp 96.5°F | Resp 18 | Wt 146.6 lb

## 2018-09-28 DIAGNOSIS — T451X5S Adverse effect of antineoplastic and immunosuppressive drugs, sequela: Secondary | ICD-10-CM | POA: Insufficient documentation

## 2018-09-28 DIAGNOSIS — C9002 Multiple myeloma in relapse: Secondary | ICD-10-CM

## 2018-09-28 DIAGNOSIS — M549 Dorsalgia, unspecified: Secondary | ICD-10-CM | POA: Insufficient documentation

## 2018-09-28 DIAGNOSIS — N390 Urinary tract infection, site not specified: Secondary | ICD-10-CM

## 2018-09-28 DIAGNOSIS — Z5112 Encounter for antineoplastic immunotherapy: Secondary | ICD-10-CM | POA: Diagnosis not present

## 2018-09-28 DIAGNOSIS — E701 Other hyperphenylalaninemias: Secondary | ICD-10-CM

## 2018-09-28 DIAGNOSIS — Z79899 Other long term (current) drug therapy: Secondary | ICD-10-CM | POA: Insufficient documentation

## 2018-09-28 DIAGNOSIS — E785 Hyperlipidemia, unspecified: Secondary | ICD-10-CM | POA: Insufficient documentation

## 2018-09-28 DIAGNOSIS — I10 Essential (primary) hypertension: Secondary | ICD-10-CM

## 2018-09-28 DIAGNOSIS — Z87891 Personal history of nicotine dependence: Secondary | ICD-10-CM | POA: Diagnosis not present

## 2018-09-28 DIAGNOSIS — Z7951 Long term (current) use of inhaled steroids: Secondary | ICD-10-CM

## 2018-09-28 DIAGNOSIS — Z86718 Personal history of other venous thrombosis and embolism: Secondary | ICD-10-CM

## 2018-09-28 DIAGNOSIS — R5383 Other fatigue: Secondary | ICD-10-CM | POA: Diagnosis not present

## 2018-09-28 DIAGNOSIS — D701 Agranulocytosis secondary to cancer chemotherapy: Secondary | ICD-10-CM | POA: Diagnosis not present

## 2018-09-28 DIAGNOSIS — J449 Chronic obstructive pulmonary disease, unspecified: Secondary | ICD-10-CM | POA: Insufficient documentation

## 2018-09-28 DIAGNOSIS — K219 Gastro-esophageal reflux disease without esophagitis: Secondary | ICD-10-CM

## 2018-09-28 DIAGNOSIS — C7951 Secondary malignant neoplasm of bone: Secondary | ICD-10-CM | POA: Diagnosis not present

## 2018-09-28 DIAGNOSIS — D702 Other drug-induced agranulocytosis: Secondary | ICD-10-CM

## 2018-09-28 DIAGNOSIS — Z7901 Long term (current) use of anticoagulants: Secondary | ICD-10-CM

## 2018-09-28 DIAGNOSIS — Z87440 Personal history of urinary (tract) infections: Secondary | ICD-10-CM | POA: Diagnosis not present

## 2018-09-28 DIAGNOSIS — Z5111 Encounter for antineoplastic chemotherapy: Secondary | ICD-10-CM

## 2018-09-28 LAB — CBC WITH DIFFERENTIAL/PLATELET
Abs Immature Granulocytes: 0.02 10*3/uL (ref 0.00–0.07)
Basophils Absolute: 0 10*3/uL (ref 0.0–0.1)
Basophils Relative: 1 %
Eosinophils Absolute: 0.9 10*3/uL — ABNORMAL HIGH (ref 0.0–0.5)
Eosinophils Relative: 21 %
HCT: 35.1 % — ABNORMAL LOW (ref 36.0–46.0)
Hemoglobin: 11.2 g/dL — ABNORMAL LOW (ref 12.0–15.0)
Immature Granulocytes: 1 %
Lymphocytes Relative: 25 %
Lymphs Abs: 1 10*3/uL (ref 0.7–4.0)
MCH: 28.9 pg (ref 26.0–34.0)
MCHC: 31.9 g/dL (ref 30.0–36.0)
MCV: 90.7 fL (ref 80.0–100.0)
MONO ABS: 1.2 10*3/uL — AB (ref 0.1–1.0)
Monocytes Relative: 27 %
NRBC: 0 % (ref 0.0–0.2)
Neutro Abs: 1.1 10*3/uL — ABNORMAL LOW (ref 1.7–7.7)
Neutrophils Relative %: 25 %
Platelets: 104 10*3/uL — ABNORMAL LOW (ref 150–400)
RBC: 3.87 MIL/uL (ref 3.87–5.11)
RDW: 14.7 % (ref 11.5–15.5)
WBC: 4.2 10*3/uL (ref 4.0–10.5)

## 2018-09-28 LAB — COMPREHENSIVE METABOLIC PANEL
ALK PHOS: 42 U/L (ref 38–126)
ALT: 17 U/L (ref 0–44)
AST: 13 U/L — ABNORMAL LOW (ref 15–41)
Albumin: 3.3 g/dL — ABNORMAL LOW (ref 3.5–5.0)
Anion gap: 6 (ref 5–15)
BUN: 13 mg/dL (ref 8–23)
CALCIUM: 9.4 mg/dL (ref 8.9–10.3)
CO2: 26 mmol/L (ref 22–32)
CREATININE: 0.8 mg/dL (ref 0.44–1.00)
Chloride: 107 mmol/L (ref 98–111)
Glucose, Bld: 96 mg/dL (ref 70–99)
Potassium: 3.4 mmol/L — ABNORMAL LOW (ref 3.5–5.1)
SODIUM: 139 mmol/L (ref 135–145)
Total Bilirubin: 0.9 mg/dL (ref 0.3–1.2)
Total Protein: 6.8 g/dL (ref 6.5–8.1)

## 2018-09-28 MED ORDER — PROCHLORPERAZINE MALEATE 10 MG PO TABS
10.0000 mg | ORAL_TABLET | Freq: Once | ORAL | Status: AC
  Administered 2018-09-28: 10 mg via ORAL
  Filled 2018-09-28: qty 1

## 2018-09-28 MED ORDER — SODIUM CHLORIDE 0.9 % IV SOLN
20.0000 mg | Freq: Once | INTRAVENOUS | Status: AC
  Administered 2018-09-28: 20 mg via INTRAVENOUS
  Filled 2018-09-28: qty 2

## 2018-09-28 MED ORDER — ACETAMINOPHEN 325 MG PO TABS
650.0000 mg | ORAL_TABLET | Freq: Once | ORAL | Status: AC
  Administered 2018-09-28: 650 mg via ORAL
  Filled 2018-09-28: qty 2

## 2018-09-28 MED ORDER — SODIUM CHLORIDE 0.9 % IV SOLN
Freq: Once | INTRAVENOUS | Status: AC
  Administered 2018-09-28: 11:00:00 via INTRAVENOUS
  Filled 2018-09-28: qty 250

## 2018-09-28 MED ORDER — POMALIDOMIDE 3 MG PO CAPS: ORAL_CAPSULE | ORAL | 0 refills | Status: DC

## 2018-09-28 MED ORDER — SODIUM CHLORIDE 0.9 % IV SOLN
1000.0000 mg | Freq: Once | INTRAVENOUS | Status: AC
  Administered 2018-09-28: 1000 mg via INTRAVENOUS
  Filled 2018-09-28: qty 40

## 2018-09-28 MED ORDER — DENOSUMAB 120 MG/1.7ML ~~LOC~~ SOLN
120.0000 mg | Freq: Once | SUBCUTANEOUS | Status: AC
  Administered 2018-09-28: 120 mg via SUBCUTANEOUS

## 2018-09-28 MED ORDER — DIPHENHYDRAMINE HCL 25 MG PO CAPS
50.0000 mg | ORAL_CAPSULE | Freq: Once | ORAL | Status: AC
  Administered 2018-09-28: 50 mg via ORAL
  Filled 2018-09-28: qty 2

## 2018-09-28 MED ORDER — FILGRASTIM 300 MCG/0.5ML IJ SOSY
300.0000 ug | PREFILLED_SYRINGE | Freq: Once | INTRAMUSCULAR | Status: AC
  Administered 2018-09-28: 300 ug via SUBCUTANEOUS
  Filled 2018-09-28: qty 0.5

## 2018-09-28 NOTE — Progress Notes (Signed)
Hematology/Oncology Follow up note [facility] [phone] [phone]   Patient Care Team: McLean-Scocuzza, Nino Glow, MD as PCP - General (Internal Medicine)  REFERRING PROVIDER: Dr. Baruch Gouty, Donette  REASON FOR VISIT Follow up for treatment of multiple myeloma.   HISTORY OF PRESENTING ILLNESS:  Cassie Jones is a  72 y.o.  female with PMH listed below who was referred to me for evaluation of multiple myeloma.  Patient used to follow-up with local oncologist Dr. Tonia Brooms and [facility] oncologist Dr. Amalia Hailey.  Patient tells me that as her husband works every day and has a busy working schedule, she is not able to get transportation to her treatment.  As an alternative she is now living with her brother who is close to [facility] [place] and want to transfer care here. Extensive medical record review was performed.    Patient was diagnosed with IgG lambda, ISS2, t (4,14) multiple myeloma on February 03, 2017, she has hemoglobin of 9, calcium 8.9, LDH 132, beta microglobulin 4.6, albumin 3.2.  Skeletal survey negative.  PET scan with single nonspecific humeral lesion and a thyroid nodule.  Marrow with 60% plasmacytosis by CD138 IHC, FISH with hyperdiploidy t(4, 14), 1 q. gain.. Per note, she was treated with CyborD (M spike 3.6 at start)  from 01/2017 to 02/2017. Her treatment was switched to Revlimid Velcade dexamethasone from 02/2017, M spike 1.2 at start,and was found to have progression of disease in 09/2017, when her M spike increased to 1.8, and developed compression fracture in 10/2016. She was restarted on RVD using weekly Velcade.  She was recently seen by Dr. Boris Sharper and recommended to start on a combination of daratumumab, Pomalyst, dexamethasone. Per patient she received her first daratumumab last week with split dose 54m/kg given on 3/14 and 3/15.  Patient reports that she tolerate the treatment well except mild infusion  reaction.  # Patient also reports that she has got dental clearance and has been given 1 dose of Zometa by Dr. VRemus Loffler(unkown dates).  [facility]  # Patient was seen by nurse practitioner last week prior to her cycle 2 daratumumab.  She reports feeling shortness of breath, having cough, believed to have bronchitis and was prescribed Levaquin 500 mg p.o. daily for 7 days, and albuterol as needed.  She also had a VQ scan low probability for pulmonary embolism.  # 01/29/2018 5th weekly treatment of Daratumumab,  Cycle 2 Pomalyst was started for a week and was nterrupted due to acute bronchitis treated with outpatient Azithromycin.    # 02/16/2018 developed right upper extremity swelling and tenderness, confirmed to be DVT. She was taking Aspirin.  Started on Eliquis. Aspirin is stopped.  # 02/16/2018 6th Daratumumab treatment. Was started on Cycle 3 Pomalyst, she has completed about 2 weeks treatment. She was admitted to hospital on 02/21/2018 for Klebsiella UTI and oncology was not consulted. She was treatment with antibiotics and continued on Pomalyst. She was hospitalized again on 03/02/2018 for pneumonia, COPD exacerbation/emphasema and oncology was consulted and patient was seen by my colleague Dr.Finnergan and advise patient to stop Pomalyst.  # [facility]  [facility] recommends dose reduced Pomalyst 340m This was previously discussed with patient, and she was reluctant to be restarted on Pomalyst. Today she seems to be more acceptable for restarting Pomalyst, but requests to start in a few weeks so that she can have  a nice break.   #Previously declined bone marrow transplant evaluation.  # admitted to [facility] 08/22/2018 due to UTI.  Renal ultrasound did not show any kidney stone.  Urine culture showed pansensitive E. coli.  Patient was discharged on a course of oral ciprofloxacin.   INTERVAL HISTORY Cassie Jones is a 72 y.o. female who has above history reviewed by me presents for assessment prior to antineoplastic immunotherapy and chemotherapy toxicity assessment.   #Recent UTI, patient had another episode of UTI with dysuria and currently on Cipro.  Dysuria improved after started on antibiotics.  Denies any hematuria #Fatigue, reports feeling pretty well.  At baseline.  Appetite is fair.  Has gained weight #Back pain, stable.  Takes oxycodone occasionally. #History of extremity DVT Eliquis 5 mg twice daily.  Denies any active bleeding events.  Data reviewed:  Multiple myeloma labs  03/06/2018 SPEP showed VGPR with >90% reduction of her M protein. Normalized free light chain ratio. 05/04/2018 SPEP continued to show stable M protein, at 0.2g/L,  06/01/2018  SPEP showed M protein 0.2g/L 07/27/2018 SPEP showed M protien  0.4g/L  08/30/2018 SPEP showed M protein 0.6g/L   Current Treatment  # 3/14, 3/15 at outside facility, she got split dose for first treatment of daratumumab. #  01/08/2018 Start on weekly daratumumab '16mg'$ /m2, dexamethasone '20mg'$ ,  S/p # Palliative RT to spine. 01/08/2018 started cycle 1 Pomalyst '4mg'$  Day 1-21 ogf 28 day cycle.  Pomalyst 4 mg was held after 3 cycles with multiple interruptions due to neutropenia and recurrent pneumonia. Pomalyst was restarted at a lower dose of 3 mg on 04/06/2018 [cycle 4] . This cycle of Pomalyst was  disrreupted for 4 days due to UTI. She was instructed to restart and was able to finish this cycle.   05/11/2018 Cycle 5 Pomalyst delayed one week as she needs to get deep dental clean before obtaining dental clearance for Xgeva.  06/01/2018  Xgeva.  06/08/2018 Cycle 6 Pomalyst '3mg'$   06/29/2018 Cycle 7 Pomalyst '3mg'$   08/03/2018 Cycle 8 Plmalyst '3mg'$    09/06/2018 Cycle 9 Pomalyst '3mg'$  [delayed due to UTI].  Daratumumab monthly Current Pain medication: started to take oxycodone '5mg'$  PRN  Review of Systems  Constitutional: Negative for chills, fever,  malaise/fatigue and weight loss.  HENT: Negative for sore throat.   Eyes: Negative for redness.  Respiratory: Negative for cough, shortness of breath and wheezing.   Cardiovascular: Negative for chest pain, palpitations and leg swelling.  Gastrointestinal: Negative for abdominal pain, blood in stool, nausea and vomiting.  Genitourinary: Positive for dysuria.  Musculoskeletal: Negative for myalgias.  Skin: Negative for rash.  Neurological: Negative for dizziness, tingling and tremors.  Endo/Heme/Allergies: Does not bruise/bleed easily.  Psychiatric/Behavioral: Negative for hallucinations.    MEDICAL HISTORY:  Past Medical History:  Diagnosis Date  . Ascites   . Asthma   . Bilateral leg edema   . Cancer ([place])   . Chicken pox   . Colon polyps   . Diverticulitis    with perforation 02/2017 and hosp with colostomy bag s/p removal   . Drug-induced neutropenia ([place]) 03/09/2018  . GERD (gastroesophageal reflux disease)   . Hyperlipidemia   . Hypertension   . Multiple myeloma ([place])    dx'ed 01/2018 follows Dr. Tasia Catchings and [facility] H/O   . Pleural effusion   . Pneumonia    03/02/18   . Thyroid nodule   . UTI (urinary tract infection)   . Vitamin D deficiency     SURGICAL HISTORY:  Past Surgical History:  Procedure Laterality Date  . ABDOMINAL HYSTERECTOMY    . BREAST BIOPSY Bilateral yrs ago   benign  . CHOLECYSTECTOMY    . COLOSTOMY  2018  . COLOSTOMY REVERSAL     11 2018  . KYPHOPLASTY     11/2017 [place]     SOCIAL HISTORY: Social History   Socioeconomic History  . Marital status: Married    Spouse name: Not on file  . Number of children: Not on file  . Years of education: Not on file  . Highest education level: Not on file  Occupational History  . Not on file  Social Needs  . Financial resource strain: Not hard at all  . Food insecurity:    Worry: Never true    Inability: Never true  . Transportation needs:    Medical: No    Non-medical: No  Tobacco Use  .  Smoking status: Former Research scientist (life sciences)  . Smokeless tobacco: Never Used  Substance and Sexual Activity  . Alcohol use: Not Currently  . Drug use: Never  . Sexual activity: Yes    Comment: husband   Lifestyle  . Physical activity:    Days per week: 0 days    Minutes per session: 0 min  . Stress: Not at all  Relationships  . Social connections:    Talks on phone: More than three times a week    Gets together: Once a week    Attends religious service: 1 to 4 times per year    Active member of club or organization: No    Attends meetings of clubs or organizations: Never    Relationship status: Married  . Intimate partner violence:    Fear of current or ex partner: No    Emotionally abused: No    Physically abused: No    Forced sexual activity: No  Other Topics Concern  . Not on file  Social History Narrative   Married    Husband lives in [place] while she gets treatment in this area    She lives alone in this area with family close by     FAMILY HISTORY: Family History  Problem Relation Age of Onset  . Breast cancer Mother 72  . Cancer Mother        breast   . Diabetes Mother   . Heart disease Mother   . Hyperlipidemia Mother   . Hypertension Mother   . Birth defects Brother   . Diabetes Brother   . Heart disease Brother   . Hyperlipidemia Brother   . Cancer Father        prostate met to liver     ALLERGIES:  is allergic to codeine.  MEDICATIONS:  Current Outpatient Medications  Medication Sig Dispense Refill  . acetaminophen (TYLENOL) 500 MG tablet Take 1,000 mg by mouth every 6 (six) hours as needed for mild pain or fever.    Marland Kitchen albuterol (VENTOLIN HFA) 108 (90 Base) MCG/ACT inhaler Inhale 2 puffs into the lungs every 6 (six) hours as needed. 1 Inhaler 1  . apixaban (ELIQUIS) 5 MG TABS tablet Take 1 tablet (5 mg total) by mouth 2 (two) times daily. 60 tablet 3  . calcium-vitamin D (OSCAL WITH D) 500-200 MG-UNIT tablet Take 1 tablet by mouth daily. 30 tablet 0  .  Cholecalciferol 50000 units capsule Take 1 capsule (50,000 Units total) by mouth once a week. 13 capsule 1  . ciprofloxacin (CIPRO) 500 MG tablet     .  dexamethasone (DECADRON) 4 MG tablet Take 5 tablets (20 mg total) by mouth once a week. 30 tablet 2  . feeding supplement, ENSURE ENLIVE, (ENSURE ENLIVE) LIQD Take 237 mLs by mouth 3 (three) times daily between meals. 237 mL 12  . fluticasone (FLONASE) 50 MCG/ACT nasal spray Place 1-2 sprays into both nostrils daily. (Patient taking differently: Place 1-2 sprays into both nostrils daily as needed. ) 16 g 2  . fluticasone (FLOVENT DISKUS) 50 MCG/BLIST diskus inhaler Inhale 1 puff into the lungs 2 (two) times daily. (Patient taking differently: Inhale 1 puff into the lungs 2 (two) times daily as needed. ) 1 Inhaler 3  . furosemide (LASIX) 20 MG tablet Take 1 tablet (20 mg total) by mouth daily as needed. 30 tablet 2  . loperamide (IMODIUM A-D) 2 MG tablet Take 2 mg by mouth 4 (four) times daily as needed for diarrhea or loose stools.    . montelukast (SINGULAIR) 10 MG tablet Take 10 mg by mouth at bedtime.    . ondansetron (ZOFRAN) 8 MG tablet Take 1 tablet (8 mg total) by mouth every 8 (eight) hours as needed for nausea. 30 tablet 0  . oxyCODONE (OXY IR/ROXICODONE) 5 MG immediate release tablet Take 1 tablet (5 mg total) by mouth every 8 (eight) hours as needed for moderate pain. 30 tablet 0  . pantoprazole (PROTONIX) 40 MG tablet Take 1 tablet (40 mg total) by mouth every morning. 30 minutes before food 90 tablet 1  . phenazopyridine (PYRIDIUM) 200 MG tablet Take 1 tablet (200 mg total) by mouth 3 (three) times daily as needed for pain. 10 tablet 0  . POMALYST 3 MG capsule TAKE 1 CAPSULE ('3MG'$ ) BY MOUTH EVERY DAY TAKE WITH WATER ON DAYS 1-21 REPEAT EVERY 28 21 capsule 0  . potassium chloride SA (K-DUR,KLOR-CON) 20 MEQ tablet Take 1 tablet (20 mEq total) by mouth daily. 30 tablet 2  . pravastatin (PRAVACHOL) 10 MG tablet Take 1 tablet by mouth at  bedtime.    . prochlorperazine (COMPAZINE) 10 MG tablet Take 10 mg by mouth every 6 (six) hours as needed for nausea.    Marland Kitchen spironolactone (ALDACTONE) 25 MG tablet Take 25 mg by mouth daily.    Marland Kitchen tiotropium (SPIRIVA) 18 MCG inhalation capsule Place 1 capsule (18 mcg total) into inhaler and inhale daily. 30 capsule 6   No current facility-administered medications for this visit.    Facility-Administered Medications Ordered in Other Visits  Medication Dose Route Frequency Provider Last Rate Last Dose  . filgrastim (NEUPOGEN) injection 300 mcg  300 mcg Subcutaneous Once Earlie Server, MD      . montelukast (SINGULAIR) tablet 10 mg  10 mg Oral Anthoney Harada, MD   10 mg at 04/20/18 1029     PHYSICAL EXAMINATION: ECOG PERFORMANCE STATUS: 1 - Symptomatic but completely ambulatory Vitals:   09/28/18 0937 09/28/18 0940  BP: 119/69   Pulse: 66   Resp: 18   Temp: (!) 96.5 F (35.8 C)   SpO2:  99%   Filed Weights   09/28/18 0937  Weight: 146 lb 9.6 oz (66.5 kg)    Physical Exam  Constitutional: She is oriented to person, place, and time. No distress.  HENT:  Head: Normocephalic and atraumatic.  Nose: Nose normal.  Mouth/Throat: Oropharynx is clear and moist. No oropharyngeal exudate.  Eyes: Pupils are equal, round, and reactive to light. EOM are normal. No scleral icterus.  Neck: Normal range of motion. Neck supple.  Cardiovascular: Normal rate  and regular rhythm.  No murmur heard. Pulmonary/Chest: Effort normal. No respiratory distress. She has no rales. She exhibits no tenderness.  Abdominal: Soft. She exhibits no distension. There is no tenderness.  Musculoskeletal: Normal range of motion. She exhibits no edema.  Neurological: She is alert and oriented to person, place, and time. No cranial nerve deficit. She exhibits normal muscle tone. Coordination normal.  Skin: Skin is warm and dry. She is not diaphoretic. No erythema.  Psychiatric: Affect normal.     LABORATORY DATA:  I have  reviewed the data as listed Lab Results  Component Value Date   WBC 4.2 09/28/2018   HGB 11.2 (L) 09/28/2018   HCT 35.1 (L) 09/28/2018   MCV 90.7 09/28/2018   PLT 104 (L) 09/28/2018   Recent Labs    07/27/18 0815 08/30/18 0827 09/28/18 0911  NA 139 139 139  K 4.6 3.0* 3.4*  CL 105 105 107  CO2 '26 26 26  '$ GLUCOSE 99 154* 96  BUN '18 9 13  '$ CREATININE 0.85 0.79 0.80  CALCIUM 9.6 8.7* 9.4  GFRNONAA >60 >60 >60  GFRAA >60 >60 >60  PROT 6.7 6.6 6.8  ALBUMIN 3.6 3.4* 3.3*  AST 17 20 13*  ALT '18 20 17  '$ ALKPHOS 50 46 42  BILITOT 1.6* 0.9 0.9   RADIOGRAPHIC STUDIES: I have personally reviewed the radiological images as listed and agreed with the findings in the report. 08/23/2018  US KIDNEY Unremarkable ultrasound examination of the kidneys, bladder and retroperitoneum  07/09/2018 BONE SCAN No focal radiotracer accumulation within the axillary or appendicular skeleton to localize multiple myeloma. Mild scoliosis of the lumbar spine. IMPRESSION: No evidence of multiple myeloma.   ASSESSMENT & PLAN:  1. Multiple myeloma in relapse ([place])   2. Frequent UTI   3. Drug-induced neutropenia ([facility])   4. Encounter for antineoplastic immunotherapy   5. Bone metastasis ([place])    #Multiple myeloma:  IgG Lamda Labs are reviewed and discussed with patient. Counts are acceptable, proceed with today's monthly maintenance daratumumab.   Reviewed Multiple myeloma panel with patient, her M spike has gradually increased to 0.6g/dl. lamda light chain level is increasing. Today's Multiple myeloma panel is pending.   I am concerned that she has progressed.   She was last seen by Dr.Tuchman on 08/31/2018 and  treatment with carfilzomib based regimen is considered as her next line of treatment.  She is going to see Dr.Tuchman in January 2020.  For now, will proceed with same treatments.  She may start cycle 10 Pomalyst on 10/18/2018    # Recurrent UTI: can be secondary to neutropenia, compromised  immunity due to multiple myeloma.  US kidney was done at [facility] and was negative. Will refer to urology for evaluation and r/o other structure etiology.   #Bone metastasis,proceed with Xgeva today.  Return of visit: 4 weeks for assessment prior to daratumumab  Earlie Server, MD, PhD Hematology [place] at [facility] Pager- [phone] 09/28/2018

## 2018-09-28 NOTE — Progress Notes (Signed)
Patient here for follow up. Pt on Cipro for UTI.

## 2018-09-29 LAB — KAPPA/LAMBDA LIGHT CHAINS
Kappa free light chain: 1.7 mg/L — ABNORMAL LOW (ref 3.3–19.4)
Kappa, lambda light chain ratio: 0.01 — ABNORMAL LOW (ref 0.26–1.65)
Lambda free light chains: 289.7 mg/L — ABNORMAL HIGH (ref 5.7–26.3)

## 2018-10-04 LAB — MULTIPLE MYELOMA PANEL, SERUM
ALPHA 1: 0.2 g/dL (ref 0.0–0.4)
Albumin SerPl Elph-Mcnc: 3.2 g/dL (ref 2.9–4.4)
Albumin/Glob SerPl: 1.1 (ref 0.7–1.7)
Alpha2 Glob SerPl Elph-Mcnc: 0.8 g/dL (ref 0.4–1.0)
B-Globulin SerPl Elph-Mcnc: 0.8 g/dL (ref 0.7–1.3)
GAMMA GLOB SERPL ELPH-MCNC: 1.2 g/dL (ref 0.4–1.8)
GLOBULIN, TOTAL: 3 g/dL (ref 2.2–3.9)
IGA: 5 mg/dL — AB (ref 64–422)
IgG (Immunoglobin G), Serum: 1717 mg/dL — ABNORMAL HIGH (ref 700–1600)
IgM (Immunoglobulin M), Srm: 9 mg/dL — ABNORMAL LOW (ref 26–217)
M Protein SerPl Elph-Mcnc: 1.1 g/dL — ABNORMAL HIGH
TOTAL PROTEIN ELP: 6.2 g/dL (ref 6.0–8.5)

## 2018-10-18 ENCOUNTER — Other Ambulatory Visit: Payer: Self-pay | Admitting: Oncology

## 2018-10-18 DIAGNOSIS — Z79899 Other long term (current) drug therapy: Secondary | ICD-10-CM

## 2018-10-18 DIAGNOSIS — G620 Drug-induced polyneuropathy: Secondary | ICD-10-CM

## 2018-10-18 NOTE — Telephone Encounter (Signed)
)   Ref Range & Units 2wk ago (09/28/18) 5mo ago (08/30/18) 16mo ago (07/27/18) 76mo ago (07/15/18) 34mo ago (06/29/18) 51mo ago (06/15/18) 26mo ago (06/07/18)  WBC 4.0 - 10.5 K/uL 4.2  5.0  4.6 R 9.2 R 4.0 R 5.9 R 4.3 R  RBC 3.87 - 5.11 MIL/uL 3.87  3.63Low   4.10 R 4.10 R 4.20 R 4.17 R 4.06 R  Hemoglobin 12.0 - 15.0 g/dL 11.2Low   10.5Low   12.2 R 12.1 R 12.2 R 12.4 R 11.9Low  R  HCT 36.0 - 46.0 % 35.1Low   33.1Low   36.0 R 36.1 R 36.9 R 36.5 R 35.9 R  MCV 80.0 - 100.0 fL 90.7  91.2  87.8  87.9  87.7  87.5  88.5   MCH 26.0 - 34.0 pg 28.9  28.9  29.7  29.6  29.1  29.7  29.2   MCHC 30.0 - 36.0 g/dL 31.9  31.7  33.8 R 33.7 R 33.2 R 33.9 R 33.0 R  RDW 11.5 - 15.5 % 14.7  15.2  15.7High  R 15.1High  R 14.8High  R 14.8High  R 14.9High  R  Platelets 150 - 400 K/uL 104Low   210  115Low  R 210 R 134Low  R 226 R 185 R  nRBC 0.0 - 0.2 % 0.0  0.0        Neutrophils Relative % % 25  51  33  77  33  78  36   Neutro Abs 1.7 - 7.7 K/uL 1.1Low   2.5  1.5 R 7.0High  R 1.3Low  R 4.7 R 1.6 R  Lymphocytes Relative % 25  25  19  7  23  14   33   Lymphs Abs 0.7 - 4.0 K/uL 1.0  1.3  0.9Low  R 0.7Low  R 0.9Low  R 0.8Low  R 1.4 R  Monocytes Relative % 27  20  33  15  30  5  26    Monocytes Absolute 0.1 - 1.0 K/uL 1.2High   1.0  1.5High  R 1.4High  R 1.2High  R 0.3 R 1.1High  R  Eosinophils Relative % 21  3  13  1  11  2  3    Eosinophils Absolute 0.0 - 0.5 K/uL 0.9High   0.1  0.6 R 0.1 R 0.4 R 0.1 R 0.1 R  Basophils Relative % 1  1  2   0  3  1  2    Basophils Absolute 0.0 - 0.1 K/uL 0.0  0.0  0.1 R, CM 0.0 R, CM 0.1 R, CM 0.0 R, CM 0.1 R, CM  Immature Granulocytes % 1  0        Abs Immature Granulocytes 0.00 - 0.07 K/uL 0.02  0.02 CM       Comment: Performed at [facility], [address] [place], [place] [address]  Resulting Agency  [facility] CLIN LAB [place] CLIN LAB [place] CLIN LAB [place] CLIN LAB [place] CLIN LAB [place] CLIN LAB [place] CL[facility] LAB      Specimen Collected: 09/28/18 09:11  Last Resulted: 09/28/18 09:25       ...   Ref  Range & Units 2wk ago 43mo ago 40mo ago 9mo ago 8mo ago  Kappa free light chain 3.3 - 19.4 mg/L 1.7Low   1.4Low   3.3  4.7  3.8   Lamda free light chains 5.7 - 26.3 mg/L 289.7High   93.5High   61.3High   17.2  6.0  Kappa, lamda light chain ratio 0.26 - 1.65 0.01Low   0.01Low  CM 0.05Low  CM 0.27 CM 0.63 CM  Comment: (NOTE)  Performed At: [facility]  [address] [place], [place] [id number]  Rush Farmer MD HB:[id number]   Resulting Agency  [facility] CLIN LAB [facility] CLIN LAB [facility] CLIN LAB [facility] CLIN LAB [facility] CLIN LAB      Specimen Collected: 09/28/18 09:11  Last Resulted: 09/29/18 16:37       ...   Ref Range & Units 2wk ago 70mo ago 45mo ago 37mo ago 61mo ago 76mo ago 85mo ago  IgG (Immunoglobin G), Serum 700 - 1,600 mg/dL 1,717High   994  680Low   550Low   509Low   498Low   2,397High    IgA 64 - 422 mg/dL 5Low   6Low  CM 7Low  CM 14Low  R, CM 15Low  R, CM 11Low  R, CM 15Low  R, CM  Comment: Result confirmed on concentration.  IgM (Immunoglobulin M), Srm 26 - 217 mg/dL 9Low   9Low  CM 22Low  CM 13Low  CM 14Low  CM 15Low  CM 27   Comment: Result confirmed on concentration.  Total Protein ELP 6.0 - 8.5 g/dL 6.2 VC 5.7Low  VC 5.9Low  VC 6.3 VC 5.7Low  VC 5.7Low  VC 7.2 VC  Albumin SerPl Elph-Mcnc 2.9 - 4.4 g/dL 3.2 VC 3.1 VC 3.1 VC 3.6 VC 3.1 VC 2.9 VC 3.2 VC  Alpha 1 0.0 - 0.4 g/dL 0.2 VC 0.2 VC 0.2 VC 0.3 VC 0.2 VC 0.3 VC 0.3 VC  Alpha2 Glob SerPl Elph-Mcnc 0.4 - 1.0 g/dL 0.8 VC 0.8 VC 0.9 VC 1.1High  VC 0.9 VC 1.1High  VC 1.1High  VC  B-Globulin SerPl Elph-Mcnc 0.7 - 1.3 g/dL 0.8 VC 0.8 VC 1.0 VC 1.0 VC 1.0 VC 0.9 VC 0.9 VC  Gamma Glob SerPl Elph-Mcnc 0.4 - 1.8 g/dL 1.2 VC 0.7 VC 0.6 VC 0.4 VC 0.5 VC 0.4 VC 1.8 VC  M Protein SerPl Elph-Mcnc Not Observed g/dL 1.1High  VC 0.6High  VC, CM 0.4High  VC, CM 0.2High  VC, CM 0.2High  VC, CM 0.1High  VC, CM 1.5High  VC  Globulin, Total 2.2 - 3.9 g/dL 3.0 VC 2.6 VC 2.8 VC 2.7 VC 2.6 VC 2.8 VC 4.0High  VC  Albumin/Glob SerPl 0.7 - 1.7 1.1 VC 1.2 VC 1.2 VC  1.4 VC 1.2 VC 1.1 VC 0.9 VC  IFE 1  Comment VC Comment VC, CM Comment VC, CM Comment VC, CM Comment VC, CM Comment VC, CM Comment VC, CM  Comment: (NOTE)  Immunofixation shows IgG monoclonal protein with lambda light chain  specificity.   Please Note  Comment VC Comment VC, CM Comment VC, CM Comment VC, CM Comment VC, CM Comment VC, CM Comment VC, CM  Comment: (NOTE)  Protein electrophoresis scan will follow via computer, mail, or  courier delivery.  Performed At: [facility]  [address] [place], [place] [id number]  Rush Farmer MD [id number]   Resulting Agency  [facility] CLIN LAB [place] CLIN LAB [place] CLIN LAB [place] CLIN LAB [place] CLIN LAB [place] CLIN LAB [facility] CLIN LAB      Specimen Collected: 09/28/18 09:11  Last Resulted: 10/04/18 15:36       )   Ref Range & Units 2wk ago (09/28/18) 50mo ago (08/30/18) 39mo ago (07/27/18) 64mo ago (07/15/18) 101mo ago (06/29/18) 65mo ago (06/15/18) 16mo ago (06/01/18)  Sodium 135 - 145 mmol/L 139  139  139  140  140  140  140   Potassium 3.5 - 5.1 mmol/L 3.4Low   3.0Low   4.6  3.2Low   3.7  3.7  4.4   Chloride 98 - 111 mmol/L 107  105  105  107  108  108  105   CO2 22 - 32 mmol/L 26  26  26  26  25  25  25    Glucose, Bld 70 - 99 mg/dL 96  154High   99  105High   109High   106High   108High    BUN 8 - 23 mg/dL 13  9  18  15  11  13  17    Creatinine, Ser 0.44 - 1.00 mg/dL 0.80  0.79  0.85  0.65  0.74  0.72  0.82   Calcium 8.9 - 10.3 mg/dL 9.4  8.7Low   9.6  9.1  9.0  8.6Low   9.5   Total Protein 6.5 - 8.1 g/dL 6.8  6.6  6.7  6.5  6.4Low   6.4Low   6.9   Albumin 3.5 - 5.0 g/dL 3.3Low   3.4Low   3.6  3.5  3.7  3.6  3.9   AST 15 - 41 U/L 13Low   20  17  15  17  16  17    ALT 0 - 44 U/L 17  20  18  15  19  17  19    Alkaline Phosphatase 38 - 126 U/L 42  46  50  47  51  58  63   Total Bilirubin 0.3 - 1.2 mg/dL 0.9  0.9  1.6High   1.2  1.1  0.8  1.0   GFR calc non Af Amer >60 mL/min >60  >60  >60  >60  >60  >60  >60   GFR calc Af Amer >60 mL/min >60  >60 CM >60 CM  >60 CM >60 CM >60 CM >60 CM  Anion gap 5 - 15 6  8  CM 8 CM 7 CM 7 CM 7 CM 10 CM  Comment: Performed at [facility], [address] [place], [place] [address]  Resulting Agency  [facility] CLIN LAB [place] CLIN LAB [place] CLIN LAB [place] CLIN LAB [place] CLIN LAB [place] CLIN LAB [place] CLIN LAB      Specimen Collected: 09/28/18 09:11  Last Resulted: 09/28/18 09:36

## 2018-10-21 ENCOUNTER — Emergency Department: Payer: Medicare PPO

## 2018-10-21 ENCOUNTER — Other Ambulatory Visit: Payer: Self-pay

## 2018-10-21 ENCOUNTER — Encounter: Payer: Self-pay | Admitting: Emergency Medicine

## 2018-10-21 ENCOUNTER — Ambulatory Visit: Payer: Self-pay | Admitting: *Deleted

## 2018-10-21 ENCOUNTER — Inpatient Hospital Stay
Admission: EM | Admit: 2018-10-21 | Discharge: 2018-10-23 | DRG: 202 | Disposition: A | Discharge status: Home / Self Care | Payer: Medicare PPO | Attending: Internal Medicine | Admitting: Internal Medicine

## 2018-10-21 DIAGNOSIS — Z87891 Personal history of nicotine dependence: Secondary | ICD-10-CM

## 2018-10-21 DIAGNOSIS — J209 Acute bronchitis, unspecified: Secondary | ICD-10-CM | POA: Diagnosis present

## 2018-10-21 DIAGNOSIS — D696 Thrombocytopenia, unspecified: Secondary | ICD-10-CM | POA: Diagnosis present

## 2018-10-21 DIAGNOSIS — I10 Essential (primary) hypertension: Secondary | ICD-10-CM | POA: Diagnosis present

## 2018-10-21 DIAGNOSIS — Z8349 Family history of other endocrine, nutritional and metabolic diseases: Secondary | ICD-10-CM | POA: Diagnosis not present

## 2018-10-21 DIAGNOSIS — Z803 Family history of malignant neoplasm of breast: Secondary | ICD-10-CM

## 2018-10-21 DIAGNOSIS — Z7901 Long term (current) use of anticoagulants: Secondary | ICD-10-CM

## 2018-10-21 DIAGNOSIS — Z7952 Long term (current) use of systemic steroids: Secondary | ICD-10-CM | POA: Diagnosis not present

## 2018-10-21 DIAGNOSIS — Z8249 Family history of ischemic heart disease and other diseases of the circulatory system: Secondary | ICD-10-CM

## 2018-10-21 DIAGNOSIS — E876 Hypokalemia: Secondary | ICD-10-CM | POA: Diagnosis not present

## 2018-10-21 DIAGNOSIS — C9000 Multiple myeloma not having achieved remission: Secondary | ICD-10-CM | POA: Diagnosis present

## 2018-10-21 DIAGNOSIS — J40 Bronchitis, not specified as acute or chronic: Secondary | ICD-10-CM

## 2018-10-21 DIAGNOSIS — E785 Hyperlipidemia, unspecified: Secondary | ICD-10-CM | POA: Diagnosis present

## 2018-10-21 DIAGNOSIS — Z833 Family history of diabetes mellitus: Secondary | ICD-10-CM | POA: Diagnosis not present

## 2018-10-21 DIAGNOSIS — J45909 Unspecified asthma, uncomplicated: Secondary | ICD-10-CM | POA: Diagnosis present

## 2018-10-21 LAB — BASIC METABOLIC PANEL
Anion gap: 7 (ref 5–15)
BUN: 16 mg/dL (ref 8–23)
CO2: 25 mmol/L (ref 22–32)
Calcium: 8.6 mg/dL — ABNORMAL LOW (ref 8.9–10.3)
Chloride: 109 mmol/L (ref 98–111)
Creatinine, Ser: 0.76 mg/dL (ref 0.44–1.00)
GFR calc Af Amer: >60 mL/min (ref >60)
Glucose, Bld: 137 mg/dL — ABNORMAL HIGH (ref 70–99)
POTASSIUM: 2.6 mmol/L — AB (ref 3.5–5.1)
Sodium: 141 mmol/L (ref 135–145)

## 2018-10-21 LAB — CBC
HCT: 35.1 % — ABNORMAL LOW (ref 36.0–46.0)
Hemoglobin: 11.2 g/dL — ABNORMAL LOW (ref 12.0–15.0)
MCH: 29.2 pg (ref 26.0–34.0)
MCHC: 31.9 g/dL (ref 30.0–36.0)
MCV: 91.6 fL (ref 80.0–100.0)
Platelets: 103 10*3/uL — ABNORMAL LOW (ref 150–400)
RBC: 3.83 MIL/uL — ABNORMAL LOW (ref 3.87–5.11)
RDW: 15 % (ref 11.5–15.5)
WBC: 4.8 10*3/uL (ref 4.0–10.5)
nRBC: 0 % (ref 0.0–0.2)

## 2018-10-21 LAB — TROPONIN I: Troponin I: <0.03 ng/mL (ref <0.03)

## 2018-10-21 LAB — MAGNESIUM: MAGNESIUM: 2.1 mg/dL (ref 1.7–2.4)

## 2018-10-21 MED ORDER — BUDESONIDE 0.25 MG/2ML IN SUSP: 2.0000 mL | Freq: Two times a day (BID) | RESPIRATORY_TRACT | Status: DC

## 2018-10-21 MED ORDER — GENERIC EXTERNAL MEDICATION
0.50 | Status: DC
Start: ? — End: 2018-10-21

## 2018-10-21 MED ORDER — LOPERAMIDE HCL 2 MG PO CAPS: 2.0000 mg | ORAL_CAPSULE | Freq: Four times a day (QID) | ORAL | Status: DC | PRN

## 2018-10-21 MED ORDER — ALBUTEROL SULFATE (2.5 MG/3ML) 0.083% IN NEBU
2.5000 mg | INHALATION_SOLUTION | RESPIRATORY_TRACT | Status: DC | PRN
  Administered 2018-10-21 – 2018-10-22 (×2): 2.5 mg via RESPIRATORY_TRACT
  Filled 2018-10-21: qty 3

## 2018-10-21 MED ORDER — POTASSIUM CHLORIDE CRYS ER 20 MEQ PO TBCR: 40.0000 meq | EXTENDED_RELEASE_TABLET | Freq: Once | ORAL | Status: DC

## 2018-10-21 MED ORDER — NALOXONE HCL 0.4 MG/ML IJ SOLN
.10 | INTRAMUSCULAR | Status: DC
Start: ? — End: 2018-10-21

## 2018-10-21 MED ORDER — MORPHINE SULFATE 2 MG/ML IJ SOLN
2.00 | INTRAMUSCULAR | Status: DC
Start: ? — End: 2018-10-21

## 2018-10-21 MED ORDER — IPRATROPIUM-ALBUTEROL 0.5-2.5 (3) MG/3ML IN SOLN
3.0000 mL | Freq: Once | RESPIRATORY_TRACT | Status: AC
  Administered 2018-10-21: 3 mL via RESPIRATORY_TRACT
  Filled 2018-10-21: qty 3

## 2018-10-21 MED ORDER — BUDESONIDE 0.25 MG/2ML IN SUSP
2.0000 mL | Freq: Two times a day (BID) | RESPIRATORY_TRACT | Status: DC
  Administered 2018-10-21 – 2018-10-23 (×4): 0.25 mg via RESPIRATORY_TRACT
  Filled 2018-10-21 (×4): qty 2

## 2018-10-21 MED ORDER — GENERIC EXTERNAL MEDICATION
4.00 | Status: DC
Start: ? — End: 2018-10-21

## 2018-10-21 MED ORDER — MOXIFLOXACIN HCL IN NACL 400 MG/250ML IV SOLN
400.00 | INTRAVENOUS | Status: DC
Start: 2018-10-20 — End: 2018-10-21

## 2018-10-21 MED ORDER — MONTELUKAST SODIUM 10 MG PO TABS
10.00 | ORAL_TABLET | ORAL | Status: DC
Start: 2018-10-19 — End: 2018-10-21

## 2018-10-21 MED ORDER — POTASSIUM CHLORIDE 10 MEQ/100ML IV SOLN: 10.0000 meq | INTRAVENOUS | Status: DC

## 2018-10-21 MED ORDER — POTASSIUM CHLORIDE 10 MEQ/100ML IV SOLN
10.0000 meq | INTRAVENOUS | Status: DC
  Administered 2018-10-21: 10 meq via INTRAVENOUS
  Filled 2018-10-21 (×4): qty 100

## 2018-10-21 MED ORDER — OXYCODONE HCL 5 MG PO TABS
5.00 | ORAL_TABLET | ORAL | Status: DC
Start: ? — End: 2018-10-21

## 2018-10-21 MED ORDER — METHYLPREDNISOLONE SODIUM SUCC 125 MG IJ SOLR
125.00 | INTRAMUSCULAR | Status: DC
Start: 2018-10-19 — End: 2018-10-21

## 2018-10-21 MED ORDER — SPIRONOLACTONE 25 MG PO TABS
25.0000 mg | ORAL_TABLET | Freq: Every day | ORAL | Status: DC
  Administered 2018-10-21 – 2018-10-23 (×3): 25 mg via ORAL
  Filled 2018-10-21 (×3): qty 1

## 2018-10-21 MED ORDER — POMALIDOMIDE 3 MG PO CAPS: ORAL_CAPSULE | ORAL | 0 refills | Status: DC

## 2018-10-21 MED ORDER — PANTOPRAZOLE SODIUM 40 MG PO TBEC
40.00 | DELAYED_RELEASE_TABLET | ORAL | Status: DC
Start: 2018-10-20 — End: 2018-10-21

## 2018-10-21 MED ORDER — OXYCODONE HCL 5 MG PO TABS: 5.0000 mg | ORAL_TABLET | Freq: Three times a day (TID) | ORAL | Status: DC | PRN

## 2018-10-21 MED ORDER — ACETAMINOPHEN 650 MG RE SUPP: 650.0000 mg | Freq: Four times a day (QID) | RECTAL | Status: DC | PRN

## 2018-10-21 MED ORDER — PROCHLORPERAZINE MALEATE 5 MG PO TABS
10.00 | ORAL_TABLET | ORAL | Status: DC
Start: ? — End: 2018-10-21

## 2018-10-21 MED ORDER — POTASSIUM CHLORIDE CRYS ER 20 MEQ PO TBCR
40.0000 meq | EXTENDED_RELEASE_TABLET | Freq: Every day | ORAL | Status: DC
  Administered 2018-10-22 – 2018-10-23 (×2): 40 meq via ORAL
  Filled 2018-10-21 (×2): qty 2

## 2018-10-21 MED ORDER — TIOTROPIUM BROMIDE MONOHYDRATE 18 MCG IN CAPS
18.0000 ug | ORAL_CAPSULE | Freq: Every day | RESPIRATORY_TRACT | Status: DC
  Administered 2018-10-21 – 2018-10-22 (×2): 18 ug via RESPIRATORY_TRACT
  Filled 2018-10-21: qty 5

## 2018-10-21 MED ORDER — ACETAMINOPHEN 325 MG PO TABS
650.00 | ORAL_TABLET | ORAL | Status: DC
Start: ? — End: 2018-10-21

## 2018-10-21 MED ORDER — PRAVASTATIN SODIUM 20 MG PO TABS
10.0000 mg | ORAL_TABLET | Freq: Every day | ORAL | Status: DC
  Administered 2018-10-21 – 2018-10-22 (×2): 10 mg via ORAL
  Filled 2018-10-21 (×2): qty 1

## 2018-10-21 MED ORDER — APIXABAN 5 MG PO TABS
5.00 | ORAL_TABLET | ORAL | Status: DC
Start: 2018-10-19 — End: 2018-10-21

## 2018-10-21 MED ORDER — GENERIC EXTERNAL MEDICATION
650.00 | Status: DC
Start: ? — End: 2018-10-21

## 2018-10-21 MED ORDER — ENOXAPARIN SODIUM 40 MG/0.4ML ~~LOC~~ SOLN: 40.0000 mg | SUBCUTANEOUS | Status: DC

## 2018-10-21 MED ORDER — PANTOPRAZOLE SODIUM 40 MG PO TBEC
40.0000 mg | DELAYED_RELEASE_TABLET | ORAL | Status: DC
  Administered 2018-10-22 – 2018-10-23 (×2): 40 mg via ORAL
  Filled 2018-10-21 (×2): qty 1

## 2018-10-21 MED ORDER — POTASSIUM CHLORIDE CRYS ER 20 MEQ PO TBCR
40.0000 meq | EXTENDED_RELEASE_TABLET | ORAL | Status: AC
  Administered 2018-10-21 – 2018-10-22 (×2): 40 meq via ORAL
  Filled 2018-10-21 (×2): qty 2

## 2018-10-21 MED ORDER — BUDESONIDE 0.5 MG/2ML IN SUSP
.50 | RESPIRATORY_TRACT | Status: DC
Start: 2018-10-19 — End: 2018-10-21

## 2018-10-21 MED ORDER — BENZONATATE 100 MG PO CAPS
100.00 | ORAL_CAPSULE | ORAL | Status: DC
Start: ? — End: 2018-10-21

## 2018-10-21 MED ORDER — ATORVASTATIN CALCIUM 10 MG PO TABS
10.00 | ORAL_TABLET | ORAL | Status: DC
Start: 2018-10-19 — End: 2018-10-21

## 2018-10-21 MED ORDER — POLYETHYLENE GLYCOL 3350 17 G PO PACK: 17.0000 g | PACK | Freq: Every day | ORAL | Status: DC | PRN

## 2018-10-21 MED ORDER — LORAZEPAM 1 MG PO TABS
1.00 | ORAL_TABLET | ORAL | Status: DC
Start: ? — End: 2018-10-21

## 2018-10-21 MED ORDER — BISACODYL 5 MG PO TBEC
10.00 | DELAYED_RELEASE_TABLET | ORAL | Status: DC
Start: ? — End: 2018-10-21

## 2018-10-21 MED ORDER — APIXABAN 5 MG PO TABS
5.0000 mg | ORAL_TABLET | Freq: Two times a day (BID) | ORAL | Status: DC
  Administered 2018-10-21 – 2018-10-23 (×4): 5 mg via ORAL
  Filled 2018-10-21 (×5): qty 1

## 2018-10-21 MED ORDER — POMALIDOMIDE 3 MG PO CAPS
3.00 | ORAL_CAPSULE | ORAL | Status: DC
Start: 2018-10-20 — End: 2018-10-21

## 2018-10-21 MED ORDER — ONDANSETRON HCL 4 MG PO TABS: 4.0000 mg | ORAL_TABLET | Freq: Four times a day (QID) | ORAL | Status: DC | PRN

## 2018-10-21 MED ORDER — METHYLPREDNISOLONE SODIUM SUCC 125 MG IJ SOLR
125.0000 mg | Freq: Once | INTRAMUSCULAR | Status: AC
  Administered 2018-10-21: 125 mg via INTRAVENOUS
  Filled 2018-10-21: qty 2

## 2018-10-21 MED ORDER — POMALIDOMIDE 3 MG PO CAPS: 3.0000 mg | ORAL_CAPSULE | Freq: Every day | ORAL | Status: DC

## 2018-10-21 MED ORDER — ONDANSETRON HCL 4 MG/2ML IJ SOLN: 4.0000 mg | Freq: Four times a day (QID) | INTRAMUSCULAR | Status: DC | PRN

## 2018-10-21 MED ORDER — ACETAMINOPHEN 325 MG PO TABS: 650.0000 mg | ORAL_TABLET | Freq: Four times a day (QID) | ORAL | Status: DC | PRN

## 2018-10-21 MED ORDER — TRAZODONE HCL 50 MG PO TABS
50.00 | ORAL_TABLET | ORAL | Status: DC
Start: ? — End: 2018-10-21

## 2018-10-21 MED ORDER — POTASSIUM CHLORIDE CRYS ER 20 MEQ PO TBCR
40.0000 meq | EXTENDED_RELEASE_TABLET | Freq: Once | ORAL | Status: AC
  Administered 2018-10-21: 40 meq via ORAL
  Filled 2018-10-21: qty 2

## 2018-10-21 MED ORDER — MONTELUKAST SODIUM 10 MG PO TABS
10.0000 mg | ORAL_TABLET | Freq: Every day | ORAL | Status: DC
  Administered 2018-10-21 – 2018-10-22 (×2): 10 mg via ORAL
  Filled 2018-10-21 (×2): qty 1

## 2018-10-21 MED ORDER — OXYCODONE-ACETAMINOPHEN 5-325 MG PO TABS
1.00 | ORAL_TABLET | ORAL | Status: DC
Start: ? — End: 2018-10-21

## 2018-10-21 NOTE — H&P (Signed)
[facility] [place] NAME: Cassie Jones    MR#:  [id number]  DATE OF BIRTH:  04/09/1946  DATE OF ADMISSION:  10/21/2018  PRIMARY CARE PHYSICIAN: McLean-Scocuzza, Nino Glow, MD   REQUESTING/REFERRING PHYSICIAN: Dr. Archie Balboa  CHIEF COMPLAINT:   Chief Complaint  Patient presents with  . Shortness of Breath    HISTORY OF PRESENT ILLNESS:  Cassie Jones  is a 73 y.o. female with a known history of multiple myeloma, hypertension, recent bronchitis needing hospitalization and discharged 2 days back from [facility] in [place] presents to the emergency room due to worsening weakness, shortness of breath and wheezing.  Patient tells me she had wheezing when she was discharged which continued to get worse.  At this time she is unable to speak more than 1-2 words without getting extremely short of breath.  Oxygen saturations are at 94% on room air.  Also found to have severe hypokalemia 2.6.  Patient has received IV Solu-Medrol, nebulizers with no significant improvement and will need admission to the hospital.  Patient was recently in the hospital and continues to worsen.  Will be admitted as inpatient. Afebrile.  Flu test checked at [facility] was negative. Chest x-ray shows no infiltrates.  PAST MEDICAL HISTORY:   Past Medical History:  Diagnosis Date  . Ascites   . Asthma   . Bilateral leg edema   . Cancer ([place])   . Chicken pox   . Colon polyps   . Diverticulitis    with perforation 02/2017 and hosp with colostomy bag s/p removal   . Drug-induced neutropenia ([place]) 03/09/2018  . GERD (gastroesophageal reflux disease)   . Hyperlipidemia   . Hypertension   . Multiple myeloma ([place])    dx'ed 01/2018 follows Dr. Tasia Catchings and [facility] H/O   . Pleural effusion   . Pneumonia    03/02/18   . Thyroid nodule   . UTI (urinary tract infection)   . Vitamin D deficiency     PAST SURGICAL HISTORY:   Past Surgical  History:  Procedure Laterality Date  . ABDOMINAL HYSTERECTOMY    . BREAST BIOPSY Bilateral yrs ago   benign  . CHOLECYSTECTOMY    . COLOSTOMY  2018  . COLOSTOMY REVERSAL     11 2018  . KYPHOPLASTY     11/2017 [place]     SOCIAL HISTORY:   Social History   Tobacco Use  . Smoking status: Former Research scientist (life sciences)  . Smokeless tobacco: Never Used  Substance Use Topics  . Alcohol use: Not Currently    FAMILY HISTORY:   Family History  Problem Relation Age of Onset  . Breast cancer Mother 72  . Cancer Mother        breast   . Diabetes Mother   . Heart disease Mother   . Hyperlipidemia Mother   . Hypertension Mother   . Birth defects Brother   . Diabetes Brother   . Heart disease Brother   . Hyperlipidemia Brother   . Cancer Father        prostate met to liver     DRUG ALLERGIES:   Allergies  Allergen Reactions  . Codeine Nausea And Vomiting    REVIEW OF SYSTEMS:   Review of Systems  Constitutional: Positive for malaise/fatigue. Negative for chills, fever and weight loss.  HENT: Negative for hearing loss and nosebleeds.   Eyes: Negative for blurred vision, double vision and pain.  Respiratory:  Positive for cough, shortness of breath and wheezing. Negative for hemoptysis and sputum production.   Cardiovascular: Negative for chest pain, palpitations, orthopnea and leg swelling.  Gastrointestinal: Negative for abdominal pain, constipation, diarrhea, nausea and vomiting.  Genitourinary: Negative for dysuria and hematuria.  Musculoskeletal: Negative for back pain, falls and myalgias.  Skin: Negative for rash.  Neurological: Negative for dizziness, tremors, sensory change, speech change, focal weakness, seizures and headaches.  Endo/Heme/Allergies: Does not bruise/bleed easily.  Psychiatric/Behavioral: Negative for depression and memory loss. The patient is not nervous/anxious.     MEDICATIONS AT HOME:   Prior to Admission medications   Medication Sig Start Date End  Date Taking? Authorizing Provider  acetaminophen (TYLENOL) 500 MG tablet Take 1,000 mg by mouth every 6 (six) hours as needed for mild pain or fever.    [provider]  albuterol (VENTOLIN HFA) 108 (90 Base) MCG/ACT inhaler Inhale 2 puffs into the lungs every 6 (six) hours as needed. 01/15/18   Verlon Au, NP  apixaban (ELIQUIS) 5 MG TABS tablet Take 1 tablet (5 mg total) by mouth 2 (two) times daily. 04/06/18   Earlie Server, MD  calcium-vitamin D (OSCAL WITH D) 500-200 MG-UNIT tablet Take 1 tablet by mouth daily. 05/18/18   Earlie Server, MD  Cholecalciferol 50000 units capsule Take 1 capsule (50,000 Units total) by mouth once a week. 03/04/18   McLean-Scocuzza, Nino Glow, MD  ciprofloxacin (CIPRO) 500 MG tablet  08/25/18   [provider]  dexamethasone (DECADRON) 4 MG tablet Take 5 tablets (20 mg total) by mouth once a week. 08/30/18   Earlie Server, MD  feeding supplement, ENSURE ENLIVE, (ENSURE ENLIVE) LIQD Take 237 mLs by mouth 3 (three) times daily between meals. 03/05/18   Fritzi Mandes, MD  fluticasone (FLONASE) 50 MCG/ACT nasal spray Place 1-2 sprays into both nostrils daily. Patient taking differently: Place 1-2 sprays into both nostrils daily as needed.  03/02/18   McLean-Scocuzza, Nino Glow, MD  fluticasone (FLOVENT DISKUS) 50 MCG/BLIST diskus inhaler Inhale 1 puff into the lungs 2 (two) times daily. Patient taking differently: Inhale 1 puff into the lungs 2 (two) times daily as needed.  01/22/18   Earlie Server, MD  furosemide (LASIX) 20 MG tablet Take 1 tablet (20 mg total) by mouth daily as needed. 01/28/18   McLean-Scocuzza, Nino Glow, MD  loperamide (IMODIUM A-D) 2 MG tablet Take 2 mg by mouth 4 (four) times daily as needed for diarrhea or loose stools.    [provider]  montelukast (SINGULAIR) 10 MG tablet Take 10 mg by mouth at bedtime.    [provider]  ondansetron (ZOFRAN) 8 MG tablet Take 1 tablet (8 mg total) by mouth every 8 (eight) hours as needed for nausea.  06/29/18   Earlie Server, MD  oxyCODONE (OXY IR/ROXICODONE) 5 MG immediate release tablet Take 1 tablet (5 mg total) by mouth every 8 (eight) hours as needed for moderate pain. 06/29/18   Earlie Server, MD  pantoprazole (PROTONIX) 40 MG tablet Take 1 tablet (40 mg total) by mouth every morning. 30 minutes before food 06/29/18   McLean-Scocuzza, Nino Glow, MD  phenazopyridine (PYRIDIUM) 200 MG tablet Take 1 tablet (200 mg total) by mouth 3 (three) times daily as needed for pain. 07/15/18   Jacquelin Hawking, NP  pomalidomide (POMALYST) 3 MG capsule TAKE 1 CAPSULE ('3MG'$ ) BY MOUTH EVERY DAY TAKE WITH WATER ON DAYS 1-21 REPEAT EVERY 28 10/21/18   Earlie Server, MD  potassium chloride SA (K-DUR,KLOR-CON)  20 MEQ tablet Take 1 tablet (20 mEq total) by mouth daily. 07/15/18   Jacquelin Hawking, NP  pravastatin (PRAVACHOL) 10 MG tablet Take 1 tablet by mouth at bedtime. 12/11/16   [provider]  prochlorperazine (COMPAZINE) 10 MG tablet Take 10 mg by mouth every 6 (six) hours as needed for nausea.    [provider]  spironolactone (ALDACTONE) 25 MG tablet Take 25 mg by mouth daily.    [provider]  tiotropium (SPIRIVA) 18 MCG inhalation capsule Place 1 capsule (18 mcg total) into inhaler and inhale daily. 05/31/18   Laverle Hobby, MD   VITAL SIGNS:  Blood pressure (!) 149/75, pulse 69, temperature 97.6 F (36.4 C), resp. rate 16, height '5\' 2"'$  (1.575 m), weight 64.4 kg, SpO2 97 %.  PHYSICAL EXAMINATION:  Physical Exam  GENERAL:  73 y.o.-year-old patient lying in the bed with conversational dyspnea. EYES: Pupils equal, round, reactive to light and accommodation. No scleral icterus. Extraocular muscles intact.  HEENT: Head atraumatic, normocephalic. Oropharynx and nasopharynx clear. No oropharyngeal erythema, moist oral mucosa  NECK:  Supple, no jugular venous distention. No thyroid enlargement, no tenderness.  LUNGS: Increased work of breathing.  Bilateral wheezing. CARDIOVASCULAR: S1, S2  normal. No murmurs, rubs, or gallops.  ABDOMEN: Soft, nontender, nondistended. Bowel sounds present. No organomegaly or mass.  EXTREMITIES: No pedal edema, cyanosis, or clubbing. + 2 pedal & radial pulses b/l.   NEUROLOGIC: Cranial nerves II through XII are intact. No focal Motor or sensory deficits appreciated b/l. PSYCHIATRIC: The patient is alert and oriented x 3. Good affect.  SKIN: No obvious rash, lesion, or ulcer.   LABORATORY PANEL:   CBC Recent Labs  Lab 10/21/18 1425  WBC 4.8  HGB 11.2*  HCT 35.1*  PLT 103*   ------------------------------------------------------------------------------------------------------------------  Chemistries  Recent Labs  Lab 10/21/18 1425  NA 141  K 2.6*  CL 109  CO2 25  GLUCOSE 137*  BUN 16  CREATININE 0.76  CALCIUM 8.6*   ------------------------------------------------------------------------------------------------------------------  Cardiac Enzymes Recent Labs  Lab 10/21/18 1425  TROPONINI <0.03   ------------------------------------------------------------------------------------------------------------------  RADIOLOGY:  Dg Chest 2 View  Result Date: 10/21/2018 CLINICAL DATA:  Worsening shortness of breath since hospital discharge 2 days ago. EXAM: CHEST - 2 VIEW COMPARISON:  Chest x-ray dated Mar 02, 2018. FINDINGS: The heart size and mediastinal contours are within normal limits. Normal pulmonary vascularity. Atherosclerotic calcification of the aortic arch. Biapical pleuroparenchymal scarring. No focal consolidation, pleural effusion, or pneumothorax. No acute osseous abnormality. IMPRESSION: No active cardiopulmonary disease. Electronically Signed   By: Titus Dubin M.D.   On: 10/21/2018 14:54   IMPRESSION AND PLAN:   *Acute bronchitis with recent hospital discharge.  Continues to worsen.  Significant shortness of breath with wheezing in spite of IV steroids and nebulizers.  Admit to inpatient.  Start IV Solu-Medrol  twice daily.  Scheduled nebulizers.  Inhalers from home.  Oxygen as needed.  Consult pulmonary if no improvement. Flu test checked during previous hospitalization negative  *Severe hypokalemia with potassium of 2.6.  Replace aggressively through oral and IV.  Check after replacement.  We will also check a magnesium level.  *Hypertension.  Continue home medications.  *Chronic mild thrombocytopenia is stable.  *Multiple myeloma.  Follows with Dr. Tasia Catchings at the cancer center and also at [facility].  DVT prophylaxis.  Patient is on Eliquis at home.  Will be continued  All the records are reviewed and case discussed with ED provider. Management plans discussed with the  patient, family and they are in agreement.  CODE STATUS: Full code  TOTAL TIME TAKING CARE OF THIS PATIENT: 40 minutes.   Leia Alf Tytan Sandate M.D on 10/21/2018 at 5:49 PM  Between 7am to 6pm - Pager - [phone]  After 6pm go to [email or web address] - password [email or web address] [facility]  Office  [phone]  CC: Primary care physician; McLean-Scocuzza, Nino Glow, MD  Note: This dictation was prepared with Dragon dictation along with smaller phrase technology. Any transcriptional errors that result from this process are unintentional.

## 2018-10-21 NOTE — Telephone Encounter (Signed)
Attempted to contact pt; left message on voice [phone].

## 2018-10-21 NOTE — ED Triage Notes (Signed)
Was just discharged from hospital in [place] for bronchitis 2 days ago.  Not getting better.  Appears short of breath currenly.  Can speak in sentences.

## 2018-10-21 NOTE — ED Notes (Signed)
MD at bedside. 

## 2018-10-21 NOTE — Progress Notes (Signed)
Advance care planning  Purpose of Encounter Acute bronchitis  Parties in Attendance Patient  Patients Decisional capacity Patient is alert and oriented.  Able to make medical decisions.  Her documented healthcare power of attorney is her sister-in-law Bartolo Darter date.  Advance directives in place.  Discussed regarding acute bronchitis, prognosis and treatment plan.  Discussed regarding CODE STATUS of DO NOT RESUSCITATE during prior admission.  Patient confirmed DO NOT RESUSCITATE initially.  After discussing regarding intubation and CPR patient at this point request that she be full code and needing resuscitation and intubation if her breathing gets worse.  CODE STATUS changed to full code.   Time spent - 17 minutes

## 2018-10-21 NOTE — Telephone Encounter (Signed)
Returned call to pt.  Reported she was recently diagnosed with Bronchitis on New Years Eve; discharged from [facility] [place], [place], and Prednisone.  Reported she started feeling worse last evening.  C/o increased shortness of breath, coughing episodes, and weakness.  Denied fever. Triage nurse can hear audible wheezes as pt. Is talking.  Stated unable to do any activity due to becoming more short of breath. Also, stated with coughing episodes, she becomes very short of breath, and is not able to bring up any mucus.  Reported feeling her chest is becoming more congested.  C/o pain in shoulder blades.  Advised to go to ER, due to worsening of symptoms.  Stated her brother and sister-in-law will take her to the ER.               Reason for Disposition . [1] MODERATE difficulty breathing (e.g., speaks in phrases, SOB even at rest, pulse 100-120) AND [2] NEW-onset or WORSE than normal  Answer Assessment - Initial Assessment Questions 1. RESPIRATORY STATUS: "Describe your breathing?" (e.g., wheezing, shortness of breath, unable to speak, severe coughing)      Feels her breathing is more short; intermittent coughing spells with increased shortness of breath since last night 2. ONSET: "When did this breathing problem begin?"      Wednesday 12/25; got worse on 12/29; went to hospital on Monday 12/30 3. PATTERN "Does the difficult breathing come and go, or has it been constant since it started?"      Constant 4. SEVERITY: "How bad is your breathing?" (e.g., mild, moderate, severe)    - MILD: No SOB at rest, mild SOB with walking, speaks normally in sentences, can lay down, no retractions, pulse < 100.    - MODERATE: SOB at rest, SOB with minimal exertion and prefers to sit, cannot lie down flat, speaks in phrases, mild retractions, audible wheezing, pulse 100-120.    - SEVERE: Very SOB at rest, speaks in single words, struggling to breathe, sitting hunched forward, retractions, pulse >  120      Moderate; wheezing frequently 5. RECURRENT SYMPTOM: "Have you had difficulty breathing before?" If so, ask: "When was the last time?" and "What happened that time?"      *No Answer* 6. CARDIAC HISTORY: "Do you have any history of heart disease?" (e.g., heart attack, angina, bypass surgery, angioplasty)      *No Answer* 7. LUNG HISTORY: "Do you have any history of lung disease?"  (e.g., pulmonary embolus, asthma, emphysema)     Recently diagnosed with bronchitis  8. CAUSE: "What do you think is causing the breathing problem?"      Chest congestion  9. OTHER SYMPTOMS: "Do you have any other symptoms? (e.g., dizziness, runny nose, cough, chest pain, fever)     Denied fever or chills, coughing, nonproductive; feels more congestion in chest, c/o pain in back in shoulder blades, increased weakness   10. PREGNANCY: "Is there any chance you are pregnant?" "When was your last menstrual period?"       N/a  11. TRAVEL: "Have you traveled out of the country in the last month?" (e.g., travel history, exposures)       N/a  Protocols used: BREATHING DIFFICULTY-A-AH  Message from [facility] sent at 10/21/2018 12:23 PM EST   Summary: Short of breath   Patient has been short of breath since getting out of hospital on Tuesday, please call back. Waited for nurse on line for 10 minutes patient did not want to  hold any longer.

## 2018-10-21 NOTE — Progress Notes (Signed)
MD aware of burning from potassium runs. Ordered to stop it and will put in orders for more PO

## 2018-10-21 NOTE — ED Provider Notes (Signed)
[facility] Emergency Department Provider Note   ____________________________________________   I have reviewed the triage vital signs and the nursing notes.   HISTORY  Chief Complaint Shortness of Breath   History limited by: Not Limited   HPI Cassie Jones is a 73 y.o. female who presents to the emergency department today because of concerns for shortness of breath.  Patient states she was just discharged from the hospital 2 days ago after an admission for bronchitis.  Since then she is continued to have shortness of breath.  She has been taking the medications as prescribed.  She states she was given steroids as well as Tessalon Perles and antibiotics.  In addition to the shortness of breath patient has failed generalized weakness.  She states that she has not been sleeping well secondary to the cough.  Patient does have a history of multiple myeloma and is followed at [facility] oncology.  She denies any fevers.   Per medical record review patient has a history of recent admission to outside hospital for breathing difficulty and diagnosed with bronchitis.   Past Medical History:  Diagnosis Date  . Ascites   . Asthma   . Bilateral leg edema   . Cancer ([place])   . Chicken pox   . Colon polyps   . Diverticulitis    with perforation 02/2017 and hosp with colostomy bag s/p removal   . Drug-induced neutropenia ([place]) 03/09/2018  . GERD (gastroesophageal reflux disease)   . Hyperlipidemia   . Hypertension   . Multiple myeloma ([place])    dx'ed 01/2018 follows Dr. Tasia Catchings and [facility] H/O   . Pleural effusion   . Pneumonia    03/02/18   . Thyroid nodule   . UTI (urinary tract infection)   . Vitamin D deficiency     Patient Active Problem List   Diagnosis Date Noted  . Encounter for antineoplastic immunotherapy 06/01/2018  . Drug-induced neutropenia ([place]) 03/09/2018  . Pneumonia 03/02/2018  . Sepsis ([place]) 02/21/2018  . UTI (urinary tract infection) 02/21/2018  .  Chronic low back pain 01/29/2018  . Neuropathy 01/28/2018  . Vitamin D deficiency 01/28/2018  . HLD (hyperlipidemia) 01/04/2018  . Tobacco abuse 01/04/2018  . Goals of care, counseling/discussion 01/04/2018  . Multiple myeloma in relapse ([place]) 01/04/2018  . Bone metastasis ([place]) 12/22/2017  . Status post surgery 09/07/2017  . Bilateral lower extremity edema 03/18/2017  . Status post Jeanette Caprice procedure [facility]) 03/18/2017  . Hypokalemia 03/01/2017  . Lower GI bleed 03/01/2017  . Multiple myeloma not having achieved remission ([place]) 03/01/2017  . Gastroesophageal reflux disease without esophagitis 10/01/2015  . Other seasonal allergic rhinitis 01/25/2015  . Deficiency of other specified B group vitamins 10/26/2014  . Anemia, unspecified 07/21/2014  . Essential hypertension 07/21/2014  . Mid back pain 07/21/2014  . Osteopenia 06/22/2014    Past Surgical History:  Procedure Laterality Date  . ABDOMINAL HYSTERECTOMY    . BREAST BIOPSY Bilateral yrs ago   benign  . CHOLECYSTECTOMY    . COLOSTOMY  2018  . COLOSTOMY REVERSAL     11 2018  . KYPHOPLASTY     11/2017 [place]     Prior to Admission medications   Medication Sig Start Date End Date Taking? Authorizing Provider  acetaminophen (TYLENOL) 500 MG tablet Take 1,000 mg by mouth every 6 (six) hours as needed for mild pain or fever.    [provider]  albuterol (VENTOLIN HFA) 108 (90 Base) MCG/ACT inhaler Inhale 2 puffs  into the lungs every 6 (six) hours as needed. 01/15/18   Verlon Au, NP  apixaban (ELIQUIS) 5 MG TABS tablet Take 1 tablet (5 mg total) by mouth 2 (two) times daily. 04/06/18   Earlie Server, MD  calcium-vitamin D (OSCAL WITH D) 500-200 MG-UNIT tablet Take 1 tablet by mouth daily. 05/18/18   Earlie Server, MD  Cholecalciferol 50000 units capsule Take 1 capsule (50,000 Units total) by mouth once a week. 03/04/18   McLean-Scocuzza, Nino Glow, MD  ciprofloxacin (CIPRO) 500 MG tablet  08/25/18   [provider]   dexamethasone (DECADRON) 4 MG tablet Take 5 tablets (20 mg total) by mouth once a week. 08/30/18   Earlie Server, MD  feeding supplement, ENSURE ENLIVE, (ENSURE ENLIVE) LIQD Take 237 mLs by mouth 3 (three) times daily between meals. 03/05/18   Fritzi Mandes, MD  fluticasone (FLONASE) 50 MCG/ACT nasal spray Place 1-2 sprays into both nostrils daily. Patient taking differently: Place 1-2 sprays into both nostrils daily as needed.  03/02/18   McLean-Scocuzza, Nino Glow, MD  fluticasone (FLOVENT DISKUS) 50 MCG/BLIST diskus inhaler Inhale 1 puff into the lungs 2 (two) times daily. Patient taking differently: Inhale 1 puff into the lungs 2 (two) times daily as needed.  01/22/18   Earlie Server, MD  furosemide (LASIX) 20 MG tablet Take 1 tablet (20 mg total) by mouth daily as needed. 01/28/18   McLean-Scocuzza, Nino Glow, MD  loperamide (IMODIUM A-D) 2 MG tablet Take 2 mg by mouth 4 (four) times daily as needed for diarrhea or loose stools.    [provider]  montelukast (SINGULAIR) 10 MG tablet Take 10 mg by mouth at bedtime.    [provider]  ondansetron (ZOFRAN) 8 MG tablet Take 1 tablet (8 mg total) by mouth every 8 (eight) hours as needed for nausea. 06/29/18   Earlie Server, MD  oxyCODONE (OXY IR/ROXICODONE) 5 MG immediate release tablet Take 1 tablet (5 mg total) by mouth every 8 (eight) hours as needed for moderate pain. 06/29/18   Earlie Server, MD  pantoprazole (PROTONIX) 40 MG tablet Take 1 tablet (40 mg total) by mouth every morning. 30 minutes before food 06/29/18   McLean-Scocuzza, Nino Glow, MD  phenazopyridine (PYRIDIUM) 200 MG tablet Take 1 tablet (200 mg total) by mouth 3 (three) times daily as needed for pain. 07/15/18   Jacquelin Hawking, NP  pomalidomide (POMALYST) 3 MG capsule TAKE 1 CAPSULE (3MG) BY MOUTH EVERY DAY TAKE WITH WATER ON DAYS 1-21 REPEAT EVERY 28 09/28/18   Earlie Server, MD  potassium chloride SA (K-DUR,KLOR-CON) 20 MEQ tablet Take 1 tablet (20 mEq total) by mouth daily. 07/15/18   Jacquelin Hawking, NP  pravastatin (PRAVACHOL) 10 MG tablet Take 1 tablet by mouth at bedtime. 12/11/16   [provider]  prochlorperazine (COMPAZINE) 10 MG tablet Take 10 mg by mouth every 6 (six) hours as needed for nausea.    [provider]  spironolactone (ALDACTONE) 25 MG tablet Take 25 mg by mouth daily.    [provider]  tiotropium (SPIRIVA) 18 MCG inhalation capsule Place 1 capsule (18 mcg total) into inhaler and inhale daily. 05/31/18   Laverle Hobby, MD    Allergies Codeine  Family History  Problem Relation Age of Onset  . Breast cancer Mother 82  . Cancer Mother        breast   . Diabetes Mother   . Heart disease Mother   . Hyperlipidemia Mother   .  Hypertension Mother   . Birth defects Brother   . Diabetes Brother   . Heart disease Brother   . Hyperlipidemia Brother   . Cancer Father        prostate met to liver     Social History Social History   Tobacco Use  . Smoking status: Former Research scientist (life sciences)  . Smokeless tobacco: Never Used  Substance Use Topics  . Alcohol use: Not Currently  . Drug use: Never    Review of Systems Constitutional: No fever/chills. Positive for generalized weakness.  Eyes: No visual changes. ENT: No sore throat. Cardiovascular: Denies chest pain. Respiratory: Positive for shortness of breath. Gastrointestinal: No abdominal pain.  No nausea, no vomiting.  No diarrhea.   Genitourinary: Negative for dysuria. Musculoskeletal: Negative for back pain. Skin: Negative for rash. Neurological: Negative for headaches, focal weakness or numbness.  ____________________________________________   PHYSICAL EXAM:  VITAL SIGNS: ED Triage Vitals  Enc Vitals Group     BP 10/21/18 1358 (!) 162/68     Pulse Rate 10/21/18 1358 67     Resp 10/21/18 1358 (!) 26     Temp 10/21/18 1358 97.6 F (36.4 C)     Temp src --      SpO2 10/21/18 1358 99 %     Weight 10/21/18 1359 142 lb (64.4 kg)     Height 10/21/18 1359 5' 2"  (1.575 m)     Head Circumference --      Peak Flow --      Pain Score 10/21/18 1359 7   Constitutional: Alert and oriented.  Eyes: Conjunctivae are normal.  ENT      Head: Normocephalic and atraumatic.      Nose: No congestion/rhinnorhea.      Mouth/Throat: Mucous membranes are moist.      Neck: No stridor. Hematological/Lymphatic/Immunilogical: No cervical lymphadenopathy. Cardiovascular: Normal rate, regular rhythm.  No murmurs, rubs, or gallops.  Respiratory: Increased respiratory effort with tachypnea. Diffuse expiratory wheezing.  Gastrointestinal: Soft and non tender. No rebound. No guarding.  Genitourinary: Deferred Musculoskeletal: Normal range of motion in all extremities. No lower extremity edema. Neurologic:  Normal speech and language. No gross focal neurologic deficits are appreciated.  Skin:  Skin is warm, dry and intact. No rash noted. Psychiatric: Mood and affect are normal. Speech and behavior are normal. Patient exhibits appropriate insight and judgment.  ____________________________________________    LABS (pertinent positives/negatives)  Trop <0.03 BMP na 141, k 2.6, glu 137, cr 0.76 CBC wbc 4.8, hgb 11.2, plt 103  ____________________________________________   EKG  I, Nance Pear, attending physician, personally viewed and interpreted this EKG  EKG Time: 1415 Rate: 74 Rhythm: sinus rhythm Axis: normal Intervals: qtc 446 QRS: incomplete RBBB ST changes: no st elevation Impression: abnormal ekg  ____________________________________________    RADIOLOGY  CXR No acute disease  ____________________________________________   PROCEDURES  Procedures  ____________________________________________   INITIAL IMPRESSION / ASSESSMENT AND PLAN / ED COURSE  Pertinent labs & imaging results that were available during my care of the patient were reviewed by me and considered in my medical decision making (see chart for details).   Patient  presented to the emergency department today because of concern for continued and worsening shortness of breath and generalized weakness. On exam patient with diffuse expiratory wheezing. Patient's blood work concerning for hypokalemia. Will start treatment for bronchitis and hypokalemia. Will plan on admission. Discussed findings and plan with patient.   ____________________________________________   FINAL CLINICAL IMPRESSION(S) / ED DIAGNOSES  Final diagnoses:  Bronchitis  Hypokalemia     Note: This dictation was prepared with Dragon dictation. Any transcriptional errors that result from this process are unintentional      Goodman, [place], MD 10/21/18 1728  

## 2018-10-21 NOTE — Telephone Encounter (Signed)
Agree pt should go to ED   [place]

## 2018-10-21 NOTE — ED Notes (Signed)
Admitting MD at bedside.

## 2018-10-22 LAB — CBC
HCT: 33.7 % — ABNORMAL LOW (ref 36.0–46.0)
Hemoglobin: 10.9 g/dL — ABNORMAL LOW (ref 12.0–15.0)
MCH: 29.2 pg (ref 26.0–34.0)
MCHC: 32.3 g/dL (ref 30.0–36.0)
MCV: 90.3 fL (ref 80.0–100.0)
Platelets: 102 10*3/uL — ABNORMAL LOW (ref 150–400)
RBC: 3.73 MIL/uL — ABNORMAL LOW (ref 3.87–5.11)
RDW: 14.7 % (ref 11.5–15.5)
WBC: 4.8 10*3/uL (ref 4.0–10.5)
nRBC: 0 % (ref 0.0–0.2)

## 2018-10-22 LAB — BASIC METABOLIC PANEL
Anion gap: 6 (ref 5–15)
BUN: 16 mg/dL (ref 8–23)
CHLORIDE: 111 mmol/L (ref 98–111)
CO2: 23 mmol/L (ref 22–32)
Calcium: 8.8 mg/dL — ABNORMAL LOW (ref 8.9–10.3)
Creatinine, Ser: 0.75 mg/dL (ref 0.44–1.00)
GFR calc Af Amer: >60 mL/min (ref >60)
GFR calc non Af Amer: >60 mL/min (ref >60)
Glucose, Bld: 147 mg/dL — ABNORMAL HIGH (ref 70–99)
Potassium: 4.4 mmol/L (ref 3.5–5.1)
Sodium: 140 mmol/L (ref 135–145)

## 2018-10-22 MED ORDER — METHYLPREDNISOLONE SODIUM SUCC 40 MG IJ SOLR
40.0000 mg | Freq: Two times a day (BID) | INTRAMUSCULAR | Status: DC
  Administered 2018-10-22 – 2018-10-23 (×3): 40 mg via INTRAVENOUS
  Filled 2018-10-22 (×3): qty 1

## 2018-10-22 MED ORDER — IPRATROPIUM-ALBUTEROL 0.5-2.5 (3) MG/3ML IN SOLN
3.0000 mL | Freq: Four times a day (QID) | RESPIRATORY_TRACT | Status: DC
  Administered 2018-10-22 – 2018-10-23 (×4): 3 mL via RESPIRATORY_TRACT
  Filled 2018-10-22 (×4): qty 3

## 2018-10-22 NOTE — Evaluation (Signed)
Physical Therapy Evaluation Patient Details Name: Cassie Jones MRN: [id number] DOB: 03-01-1946 Today's Date: 10/22/2018   History of Present Illness  73 yo female with onset of SOB and no drops in O2 sats was admitted from home.  Had recent admission to [facility] in [place] for one day, with SOB and wheezing, weakness.  Currently repleting K+, receiving breathing treatments with steroids.  PMHx:  multiple myeloma (current treatment), bronchitis, colon polyps, LE edema, HTN, thyroid nodule, UTI, vit D defic  Clinical Impression  Pt was seen for mobility and noted minimal SOB but frequent stops.  Her O2 has been controlled but instead of HHA gait will need to use RW for more support.  Pt is stopping on the hall to rest briefly but wants to walk longer trips to increase her endurance.  Given her motivation and around the clock care, recommend return home with HHPT and acute therapy to work on strengthening her tolerance for activity until then.    Follow Up Recommendations Home health PT;Supervision for mobility/OOB    Equipment Recommendations  None recommended by PT    Recommendations for Other Services       Precautions / Restrictions Precautions Precautions: Fall(telemetry) Restrictions Weight Bearing Restrictions: No      Mobility  Bed Mobility Overal bed mobility: Needs Assistance Bed Mobility: Supine to Sit;Sit to Supine     Supine to sit: Min guard Sit to supine: Min guard   General bed mobility comments: min guard for safety given SOB and weakness recently  Transfers Overall transfer level: Needs assistance Equipment used: 1 person hand held assist Transfers: Sit to/from Stand Sit to Stand: Min guard         General transfer comment: min guard to steady her  Ambulation/Gait Ambulation/Gait assistance: Min guard Gait Distance (Feet): 110 Feet Assistive device: 1 person hand held assist Gait Pattern/deviations: Step-through pattern;Decreased stride  length;Wide base of support;Shuffle(very paced with short rests in standing) Gait velocity: reduc Gait velocity interpretation: <1.31 ft/sec, indicative of household ambulator    Financial trader Rankin (Stroke Patients Only)       Balance Overall balance assessment: Needs assistance Sitting-balance support: Feet supported Sitting balance-Leahy Scale: Good     Standing balance support: Single extremity supported Standing balance-Leahy Scale: Fair                               Pertinent Vitals/Pain Pain Assessment: No/denies pain    Home Living Family/patient expects to be discharged to:: Private residence Living Arrangements: Spouse/significant other;Other relatives Available Help at Discharge: Family;Available 24 hours/day Type of Home: House Home Access: Level entry     Home Layout: Two level;Able to live on main level with bedroom/bathroom Home Equipment: Gilford Rile - 2 wheels;Cane - single point;Walker - 4 wheels;Shower seat      Prior Function Level of Independence: Independent with assistive device(s)         Comments: holds the furniture and tends not to use AD's     Hand Dominance   Dominant Hand: Right    Extremity/Trunk Assessment   Upper Extremity Assessment Upper Extremity Assessment: Overall WFL for tasks assessed    Lower Extremity Assessment Lower Extremity Assessment: Overall WFL for tasks assessed    Cervical / Trunk Assessment Cervical / Trunk Assessment: Normal  Communication   Communication: No difficulties(can talk while walking)  Cognition Arousal/Alertness: Awake/alert Behavior During Therapy: WFL for tasks assessed/performed Overall Cognitive Status: Within Functional Limits for tasks assessed                                        General Comments General comments (skin integrity, edema, etc.): pt is much less SOB today and is able to walk in paced way, but  with RW will be able to manage wiht mod I     Exercises     Assessment/Plan    PT Assessment Patient needs continued PT services  PT Problem List Decreased range of motion;Decreased activity tolerance;Decreased balance;Decreased mobility;Cardiopulmonary status limiting activity;Decreased safety awareness       PT Treatment Interventions DME instruction;Gait training;Functional mobility training;Therapeutic activities;Therapeutic exercise;Balance training;Neuromuscular re-education;Patient/family education    PT Goals (Current goals can be found in the Care Plan section)  Acute Rehab PT Goals Patient Stated Goal: to get stronger and get home PT Goal Formulation: With patient Time For Goal Achievement: 11/05/18 Potential to Achieve Goals: Good    Frequency Min 2X/week   Barriers to discharge Other (comment)(has help at home and level environment with walkers)      Co-evaluation               AM-PAC PT "6 Clicks" Mobility  Outcome Measure Help needed turning from your back to your side while in a flat bed without using bedrails?: None Help needed moving from lying on your back to sitting on the side of a flat bed without using bedrails?: A Little Help needed moving to and from a bed to a chair (including a wheelchair)?: A Little Help needed standing up from a chair using your arms (e.g., wheelchair or bedside chair)?: A Little Help needed to walk in hospital room?: A Little Help needed climbing 3-5 steps with a railing? : A Lot 6 Click Score: 18    End of Session Equipment Utilized During Treatment: Gait belt Activity Tolerance: Patient tolerated treatment well;Patient limited by fatigue Patient left: in bed;with call bell/phone within reach;with bed alarm set;with family/visitor present Nurse Communication: Mobility status PT Visit Diagnosis: Unsteadiness on feet (R26.81)    Time: 4356-8616 PT Time Calculation (min) (ACUTE ONLY): 19 min   Charges:   PT  Evaluation $PT Eval Moderate Complexity: 1 Mod          Ramond Dial 10/22/2018, 9:40 AM  Mee Hives, PT MS Acute Rehab Dept. Number: [place] [facility] [place]

## 2018-10-22 NOTE — Progress Notes (Signed)
Assumed care of patient. No acute problems noted. No pain, CP, or SOB. No complaints at this time. Will continue to monitor.  

## 2018-10-22 NOTE — Care Management Note (Signed)
Case Management Note  Patient Details  Name: Cassie Jones MRN: [id number] Date of Birth: 01-08-1946   Patient admitted from home with acute bronchitis.  Patient lives at home with her husband in [place].  Patient receives treatment for Multiple Myeloma in [place]. While she is receiving her treatments she stays with her brother and sister in Sports [place] in [place] at [place] [address].  Patient states that she has a RW, cane and shower seat. PT has assessed patient and recommends home health PT.  Patient agreeable and states that she would like to use [facility] health again.  CMS Medicare.gov Compare Post Acute Care list reviewed with patient.  Heads up referral made to [place] with [facility], she is aware of which address she will be staying at.   Patient states that she has a "treatment appointment on Tuesday" and a follow up appointment with [facility] on Wednesday. Patient is wondering if she needs to go to her treatment on Tuesday or reschedule.  RNCM reached to Dr Tasia Catchings.  Dr Tasia Catchings request that patient skip appointment on Tuesday, and reschedule after her [facility] appointment.  [name] CMA at Dr Collie Siad office is taking care of this  Subjective/Objective:                    Action/Plan:   Expected Discharge Date:                  Expected Discharge Plan:  [place]  In-House Referral:     Discharge planning Services  CM Consult  Post Acute Care Choice:  Home Health Choice offered to:  Patient  DME Arranged:    DME Agency:     HH Arranged:  RN, PT [place] Agency:     Status of Service:  In process, will continue to follow  If discussed at Long Length of Stay Meetings, dates discussed:    Additional Comments:  Beverly Sessions, RN 10/22/2018, 4:17 PM

## 2018-10-22 NOTE — Progress Notes (Signed)
[facility] [place] NAME: Cassie Jones    MR#:  [id number]  DATE OF BIRTH:  03-16-1946  SUBJECTIVE:  CHIEF COMPLAINT: Patient is still coughing and shortness of breath is slightly better  REVIEW OF SYSTEMS:  CONSTITUTIONAL: No fever, fatigue or weakness.  EYES: No blurred or double vision.  EARS, NOSE, AND THROAT: No tinnitus or ear pain.  RESPIRATORY: No cough, shortness of breath, wheezing or hemoptysis.  CARDIOVASCULAR: No chest pain, orthopnea, edema.  GASTROINTESTINAL: No nausea, vomiting, diarrhea or abdominal pain.  GENITOURINARY: No dysuria, hematuria.  ENDOCRINE: No polyuria, nocturia,  HEMATOLOGY: No anemia, easy bruising or bleeding SKIN: No rash or lesion. MUSCULOSKELETAL: No joint pain or arthritis.   NEUROLOGIC: No tingling, numbness, weakness.  PSYCHIATRY: No anxiety or depression.   DRUG ALLERGIES:   Allergies  Allergen Reactions  . Codeine Nausea And Vomiting    VITALS:  Blood pressure (!) 153/81, pulse 75, temperature (!) 97.4 F (36.3 C), temperature source Oral, resp. rate 20, height '5\' 2"'$  (1.575 m), weight 67.1 kg, SpO2 95 %.  PHYSICAL EXAMINATION:  GENERAL:  73 y.o.-year-old patient lying in the bed with no acute distress.  EYES: Pupils equal, round, reactive to light and accommodation. No scleral icterus. Extraocular muscles intact.  HEENT: Head atraumatic, normocephalic. Oropharynx and nasopharynx clear.  NECK:  Supple, no jugular venous distention. No thyroid enlargement, no tenderness.  LUNGS: Coarse bronchial breath sounds bilaterally, minimal diffuse wheezing, no rales,rhonchi or crepitation. No use of accessory muscles of respiration.  CARDIOVASCULAR: S1, S2 normal. No murmurs, rubs, or gallops.  ABDOMEN: Soft, nontender, nondistended. Bowel sounds present. No organomegaly or mass.  EXTREMITIES: No pedal edema, cyanosis, or clubbing.  NEUROLOGIC: Awake, alert and oriented x3 sensation intact.  Gait not checked.  PSYCHIATRIC: The patient is alert and oriented x 3.  SKIN: No obvious rash, lesion, or ulcer.    LABORATORY PANEL:   CBC Recent Labs  Lab 10/22/18 0646  WBC 4.8  HGB 10.9*  HCT 33.7*  PLT 102*   ------------------------------------------------------------------------------------------------------------------  Chemistries  Recent Labs  Lab 10/21/18 1425 10/22/18 0646  NA 141 140  K 2.6* 4.4  CL 109 111  CO2 25 23  GLUCOSE 137* 147*  BUN 16 16  CREATININE 0.76 0.75  CALCIUM 8.6* 8.8*  MG 2.1  --    ------------------------------------------------------------------------------------------------------------------  Cardiac Enzymes Recent Labs  Lab 10/21/18 1425  TROPONINI <0.03   ------------------------------------------------------------------------------------------------------------------  RADIOLOGY:  Dg Chest 2 View  Result Date: 10/21/2018 CLINICAL DATA:  Worsening shortness of breath since hospital discharge 2 days ago. EXAM: CHEST - 2 VIEW COMPARISON:  Chest x-ray dated Mar 02, 2018. FINDINGS: The heart size and mediastinal contours are within normal limits. Normal pulmonary vascularity. Atherosclerotic calcification of the aortic arch. Biapical pleuroparenchymal scarring. No focal consolidation, pleural effusion, or pneumothorax. No acute osseous abnormality. IMPRESSION: No active cardiopulmonary disease. Electronically Signed   By: Titus Dubin M.D.   On: 10/21/2018 14:54    EKG:   Orders placed or performed during the hospital encounter of 10/21/18  . ED EKG within 10 minutes  . ED EKG within 10 minutes    ASSESSMENT AND PLAN:    *Acute bronchitis with recent hospital discharge.  Continues to worsen.    IV Solu-Medrol twice daily.  Taper steroids with clinical improvement  Scheduled nebulizers.    Oxygen as needed.  Consult pulmonary if no improvement. Flu test checked during previous hospitalization negative  *Severe  hypokalemia  with potassium of 2.6. Resolved with IV and p.o. potassium.  Potassium at 4.4 today. Magnesium at 2.1  *Hypertension.  Continue home medications.  *Chronic mild thrombocytopenia is stable.  *Multiple myeloma.  Follows with Dr. Tasia Catchings at the cancer center and also at [facility].  Dr. Tasia Catchings has recommended the patient to follow-up with her [facility] appointment on Wednesday and reschedule Dr. Collie Siad appointment    All the records are reviewed and case discussed with Care Management/Social Workerr. Management plans discussed with the patient, family and they are in agreement.  CODE STATUS:   TOTAL TIME TAKING CARE OF THIS PATIENT: 35 minutes.   POSSIBLE D/C IN 1-2  DAYS, DEPENDING ON CLINICAL CONDITION.  Note: This dictation was prepared with Dragon dictation along with smaller phrase technology. Any transcriptional errors that result from this process are unintentional.   Nicholes Mango M.D on 10/22/2018 at 4:04 PM  Between 7am to 6pm - Pager - [phone] After 6pm go to [email or web address] - password [email or web address] [facility]  [facility]  Office  [phone]  CC: Primary care physician; McLean-Scocuzza, Nino Glow, MD

## 2018-10-23 MED ORDER — DOXYCYCLINE HYCLATE 100 MG PO CAPS: 100.0000 mg | ORAL_CAPSULE | Freq: Two times a day (BID) | ORAL | 0 refills | Status: AC

## 2018-10-23 NOTE — Progress Notes (Signed)
Discharge instructions reviewed with patient including followup visits and new medications.  Understanding was verbalized and all questions were answered.  IV removed without complication; patient tolerated well.  Patient discharged home via wheelchair in stable condition escorted by nursing staff.  

## 2018-10-23 NOTE — Progress Notes (Addendum)
Previous RNCM completed disposition. Confirmed referral with brittany from [facility], no further needs.

## 2018-10-23 NOTE — Discharge Summary (Signed)
[facility] NAME: Cassie Jones    MR#:  [id number]  DATE OF BIRTH:  [birth year]  DATE OF ADMISSION:  10/21/2018 ADMITTING PHYSICIAN: Hillary Bow, MD  DATE OF DISCHARGE: October 23, 2018  PRIMARY CARE PHYSICIAN: McLean-Scocuzza, Nino Glow, MD    ADMISSION DIAGNOSIS:  Hypokalemia [E87.6] Bronchitis [J40]  DISCHARGE DIAGNOSIS:  Active Problems:   Acute bronchitis   SECONDARY DIAGNOSIS:   Past Medical History:  Diagnosis Date  . Ascites   . Asthma   . Bilateral leg edema   . Cancer ([place])   . Chicken pox   . Colon polyps   . Diverticulitis    with perforation 02/2017 and hosp with colostomy bag s/p removal   . Drug-induced neutropenia ([place]) 03/09/2018  . GERD (gastroesophageal reflux disease)   . Hyperlipidemia   . Hypertension   . Multiple myeloma ([place])    dx'ed 01/2018 follows Dr. Tasia Catchings and [facility] H/O   . Pleural effusion   . Pneumonia    03/02/18   . Thyroid nodule   . UTI (urinary tract infection)   . Vitamin D deficiency     HOSPITAL COURSE:  73 year old female with a history of multiple myeloma who presents to the emergency room due to shortness of breath.   1.  Acute bronchitis: Patient's bronchitis symptoms have improved.  She has no wheezing upon discharge.   She will continue with doxycycline for acute bronchitis and her inhalers as needed.  2.  Hypokalemia: This was repleted.  3 History of multiple myeloma: Patient will follow-up with [facility] and then Dr. Tasia Catchings.   DISCHARGE CONDITIONS AND DIET:   Stable for discharge on heart healthy diet  CONSULTS OBTAINED:    DRUG ALLERGIES:   Allergies  Allergen Reactions  . Codeine Nausea And Vomiting    DISCHARGE MEDICATIONS:   Allergies as of 10/23/2018      Reactions   Codeine Nausea And Vomiting      Medication List    STOP taking these medications   moxifloxacin 400 MG tablet Commonly known as:  AVELOX     TAKE these medications   acetaminophen 500 MG  tablet Commonly known as:  TYLENOL Take 1,000 mg by mouth every 6 (six) hours as needed for mild pain or fever.   albuterol 108 (90 Base) MCG/ACT inhaler Commonly known as:  VENTOLIN HFA Inhale 2 puffs into the lungs every 6 (six) hours as needed.   apixaban 5 MG Tabs tablet Commonly known as:  ELIQUIS Take 1 tablet (5 mg total) by mouth 2 (two) times daily.   benzonatate 100 MG capsule Commonly known as:  TESSALON Take 100 mg by mouth 3 (three) times daily.   calcium-vitamin D 500-200 MG-UNIT tablet Commonly known as:  OSCAL WITH D Take 1 tablet by mouth daily.   Cholecalciferol 1.25 MG (50000 UT) capsule Take 1 capsule (50,000 Units total) by mouth once a week.   dexamethasone 4 MG tablet Commonly known as:  DECADRON Take 5 tablets (20 mg total) by mouth once a week.   doxycycline 100 MG capsule Commonly known as:  VIBRAMYCIN Take 1 capsule (100 mg total) by mouth 2 (two) times daily for 3 days.   feeding supplement (ENSURE ENLIVE) Liqd Take 237 mLs by mouth 3 (three) times daily between meals.   fluticasone 50 MCG/ACT nasal spray Commonly known as:  FLONASE Place 1-2 sprays into both nostrils daily. What changed:    when to take this  reasons to take this   fluticasone 50 MCG/BLIST diskus inhaler Commonly known as:  FLOVENT DISKUS Inhale 1 puff into the lungs 2 (two) times daily. What changed:    when to take this  reasons to take this   furosemide 20 MG tablet Commonly known as:  LASIX Take 1 tablet (20 mg total) by mouth daily as needed.   loperamide 2 MG tablet Commonly known as:  IMODIUM A-D Take 2 mg by mouth 4 (four) times daily as needed for diarrhea or loose stools.   montelukast 10 MG tablet Commonly known as:  SINGULAIR Take 10 mg by mouth at bedtime.   ondansetron 8 MG tablet Commonly known as:  ZOFRAN Take 1 tablet (8 mg total) by mouth every 8 (eight) hours as needed for nausea.   oxyCODONE 5 MG immediate release tablet Commonly  known as:  Oxy IR/ROXICODONE Take 1 tablet (5 mg total) by mouth every 8 (eight) hours as needed for moderate pain.   pantoprazole 40 MG tablet Commonly known as:  PROTONIX Take 1 tablet (40 mg total) by mouth every morning. 30 minutes before food   pomalidomide 3 MG capsule Commonly known as:  POMALYST TAKE 1 CAPSULE (3MG) BY MOUTH EVERY DAY TAKE WITH WATER ON DAYS 1-21 REPEAT EVERY 28   potassium chloride SA 20 MEQ tablet Commonly known as:  K-DUR,KLOR-CON Take 1 tablet (20 mEq total) by mouth daily.   pravastatin 10 MG tablet Commonly known as:  PRAVACHOL Take 1 tablet by mouth at bedtime.   predniSONE 20 MG tablet Commonly known as:  DELTASONE Take 40 mg by mouth daily.   prochlorperazine 10 MG tablet Commonly known as:  COMPAZINE Take 10 mg by mouth every 6 (six) hours as needed for nausea.   spironolactone 25 MG tablet Commonly known as:  ALDACTONE Take 25 mg by mouth daily.   tiotropium 18 MCG inhalation capsule Commonly known as:  SPIRIVA Place 1 capsule (18 mcg total) into inhaler and inhale daily.         Today   CHIEF COMPLAINT:  Patient is doing well this morning.  No shortness of breath or wheezing.   VITAL SIGNS:  Blood pressure (!) 148/75, pulse 70, temperature (!) 97.5 F (36.4 C), temperature source Oral, resp. rate 16, height 5' 2" (1.575 m), weight 66.6 kg, SpO2 94 %.   REVIEW OF SYSTEMS:  Review of Systems  Constitutional: Negative.  Negative for chills, fever and malaise/fatigue.  HENT: Negative.  Negative for ear discharge, ear pain, hearing loss, nosebleeds and sore throat.   Eyes: Negative.  Negative for blurred vision and pain.  Respiratory: Negative.  Negative for cough, hemoptysis, shortness of breath and wheezing.   Cardiovascular: Negative.  Negative for chest pain, palpitations and leg swelling.  Gastrointestinal: Negative.  Negative for abdominal pain, blood in stool, diarrhea, nausea and vomiting.  Genitourinary: Negative.   Negative for dysuria.  Musculoskeletal: Negative.  Negative for back pain.  Skin: Negative.   Neurological: Negative for dizziness, tremors, speech change, focal weakness, seizures and headaches.  Endo/Heme/Allergies: Negative.  Does not bruise/bleed easily.  Psychiatric/Behavioral: Negative.  Negative for depression, hallucinations and suicidal ideas.     PHYSICAL EXAMINATION:  GENERAL:  73 y.o.-year-old patient lying in the bed with no acute distress.  NECK:  Supple, no jugular venous distention. No thyroid enlargement, no tenderness.  LUNGS: Normal breath sounds bilaterally, no wheezing, rales,rhonchi  No use of accessory muscles of respiration.  CARDIOVASCULAR: S1, S2 normal. No murmurs, rubs,  or gallops.  ABDOMEN: Soft, non-tender, non-distended. Bowel sounds present. No organomegaly or mass.  EXTREMITIES: No pedal edema, cyanosis, or clubbing.  PSYCHIATRIC: The patient is alert and oriented x 3.  SKIN: No obvious rash, lesion, or ulcer.   DATA REVIEW:   CBC Recent Labs  Lab 10/22/18 0646  WBC 4.8  HGB 10.9*  HCT 33.7*  PLT 102*    Chemistries  Recent Labs  Lab 10/21/18 1425 10/22/18 0646  NA 141 140  K 2.6* 4.4  CL 109 111  CO2 25 23  GLUCOSE 137* 147*  BUN 16 16  CREATININE 0.76 0.75  CALCIUM 8.6* 8.8*  MG 2.1  --     Cardiac Enzymes Recent Labs  Lab 10/21/18 1425  TROPONINI <0.03    Microbiology Results  _0 @  RADIOLOGY:  Dg Chest 2 View  Result Date: 10/21/2018 CLINICAL DATA:  Worsening shortness of breath since hospital discharge 2 days ago. EXAM: CHEST - 2 VIEW COMPARISON:  Chest x-ray dated Mar 02, 2018. FINDINGS: The heart size and mediastinal contours are within normal limits. Normal pulmonary vascularity. Atherosclerotic calcification of the aortic arch. Biapical pleuroparenchymal scarring. No focal consolidation, pleural effusion, or pneumothorax. No acute osseous abnormality. IMPRESSION: No active cardiopulmonary disease.  Electronically Signed   By: Titus Dubin M.D.   On: 10/21/2018 14:54      Allergies as of 10/23/2018      Reactions   Codeine Nausea And Vomiting      Medication List    STOP taking these medications   moxifloxacin 400 MG tablet Commonly known as:  AVELOX     TAKE these medications   acetaminophen 500 MG tablet Commonly known as:  TYLENOL Take 1,000 mg by mouth every 6 (six) hours as needed for mild pain or fever.   albuterol 108 (90 Base) MCG/ACT inhaler Commonly known as:  VENTOLIN HFA Inhale 2 puffs into the lungs every 6 (six) hours as needed.   apixaban 5 MG Tabs tablet Commonly known as:  ELIQUIS Take 1 tablet (5 mg total) by mouth 2 (two) times daily.   benzonatate 100 MG capsule Commonly known as:  TESSALON Take 100 mg by mouth 3 (three) times daily.   calcium-vitamin D 500-200 MG-UNIT tablet Commonly known as:  OSCAL WITH D Take 1 tablet by mouth daily.   Cholecalciferol 1.25 MG (50000 UT) capsule Take 1 capsule (50,000 Units total) by mouth once a week.   dexamethasone 4 MG tablet Commonly known as:  DECADRON Take 5 tablets (20 mg total) by mouth once a week.   doxycycline 100 MG capsule Commonly known as:  VIBRAMYCIN Take 1 capsule (100 mg total) by mouth 2 (two) times daily for 3 days.   feeding supplement (ENSURE ENLIVE) Liqd Take 237 mLs by mouth 3 (three) times daily between meals.   fluticasone 50 MCG/ACT nasal spray Commonly known as:  FLONASE Place 1-2 sprays into both nostrils daily. What changed:    when to take this  reasons to take this   fluticasone 50 MCG/BLIST diskus inhaler Commonly known as:  FLOVENT DISKUS Inhale 1 puff into the lungs 2 (two) times daily. What changed:    when to take this  reasons to take this   furosemide 20 MG tablet Commonly known as:  LASIX Take 1 tablet (20 mg total) by mouth daily as needed.   loperamide 2 MG tablet Commonly known as:  IMODIUM A-D Take 2 mg by mouth 4 (four) times daily as  needed for  diarrhea or loose stools.   montelukast 10 MG tablet Commonly known as:  SINGULAIR Take 10 mg by mouth at bedtime.   ondansetron 8 MG tablet Commonly known as:  ZOFRAN Take 1 tablet (8 mg total) by mouth every 8 (eight) hours as needed for nausea.   oxyCODONE 5 MG immediate release tablet Commonly known as:  Oxy IR/ROXICODONE Take 1 tablet (5 mg total) by mouth every 8 (eight) hours as needed for moderate pain.   pantoprazole 40 MG tablet Commonly known as:  PROTONIX Take 1 tablet (40 mg total) by mouth every morning. 30 minutes before food   pomalidomide 3 MG capsule Commonly known as:  POMALYST TAKE 1 CAPSULE (3MG) BY MOUTH EVERY DAY TAKE WITH WATER ON DAYS 1-21 REPEAT EVERY 28   potassium chloride SA 20 MEQ tablet Commonly known as:  K-DUR,KLOR-CON Take 1 tablet (20 mEq total) by mouth daily.   pravastatin 10 MG tablet Commonly known as:  PRAVACHOL Take 1 tablet by mouth at bedtime.   predniSONE 20 MG tablet Commonly known as:  DELTASONE Take 40 mg by mouth daily.   prochlorperazine 10 MG tablet Commonly known as:  COMPAZINE Take 10 mg by mouth every 6 (six) hours as needed for nausea.   spironolactone 25 MG tablet Commonly known as:  ALDACTONE Take 25 mg by mouth daily.   tiotropium 18 MCG inhalation capsule Commonly known as:  SPIRIVA Place 1 capsule (18 mcg total) into inhaler and inhale daily.         Management plans discussed with the patient and she is in agreement. Stable for discharge home  Patient should follow up with pcp  CODE STATUS:     Code Status Orders  (From admission, onward)         Start     Ordered   10/21/18 1745  Full code  Continuous     10/21/18 1746        Code Status History    Date Active Date Inactive Code Status Order ID Comments User Context   03/02/2018 2154 03/05/2018 1801 DNR [id number]  Demetrios Loll, MD Inpatient   02/22/2018 1418 02/25/2018 1921 DNR [id number]  Demetrios Loll, MD Inpatient   02/21/2018 2344  02/22/2018 1418 Full Code [id number]  Lance Coon, MD ED    Advance Directive Documentation     Most Recent Value  Type of Advance Directive  Healthcare Power of Attorney, Living will  Pre-existing out of facility DNR order (yellow form or pink MOST form)  -  "MOST" Form in Place?  -      TOTAL TIME TAKING CARE OF THIS PATIENT: 38 minutes.    Note: This dictation was prepared with Dragon dictation along with smaller phrase technology. Any transcriptional errors that result from this process are unintentional.  MODY, SITAL M.D on 10/23/2018 at 11:28 AM  Between 7am to 6pm - Pager - [phone] After 6pm go to [email or web address] - password [email or web address]  Office  [phone]  CC: Primary care physician; McLean-Scocuzza, Nino Glow, MD

## 2018-10-25 ENCOUNTER — Telehealth: Payer: Self-pay | Admitting: Internal Medicine

## 2018-10-25 NOTE — Telephone Encounter (Signed)
Transition Care Management Follow-up Telephone Call  How have you been since you were released from the hospital? Patient stated she is feeling better , but still weak and coughing.   Do you understand why you were in the hospital? yes   Do you understand the discharge instrcutions? yes  Items Reviewed:  Medications reviewed: yes  Allergies reviewed: yes  Dietary changes reviewed: yes  Referrals reviewed: yes   Functional Questionnaire:   Activities of Daily Living (ADLs):   She states they are independent in the following: ambulation, bathing and hygiene, feeding, continence, grooming, toileting and dressing States they require assistance with the following: No assistance needed at this time.   Any transportation issues/concerns?: no   Any patient concerns? no   Confirmed importance and date/time of follow-up visits scheduled: yes   Confirmed with patient if condition begins to worsen call PCP or go to the ER.  Patient was given the Call-a-Nurse line [phone]: yes

## 2018-10-26 ENCOUNTER — Ambulatory Visit: Payer: Medicare PPO

## 2018-10-26 ENCOUNTER — Ambulatory Visit: Payer: Medicare PPO | Admitting: Oncology

## 2018-10-26 ENCOUNTER — Other Ambulatory Visit: Payer: Medicare PPO

## 2018-10-28 ENCOUNTER — Other Ambulatory Visit: Payer: Self-pay | Admitting: Oncology

## 2018-10-28 ENCOUNTER — Ambulatory Visit: Payer: Medicare PPO | Admitting: Internal Medicine

## 2018-10-28 ENCOUNTER — Encounter: Payer: Self-pay | Admitting: Internal Medicine

## 2018-10-28 VITALS — BP 122/88 | HR 80 | Temp 97.8°F | Ht 62.0 in | Wt 152.2 lb

## 2018-10-28 DIAGNOSIS — C9002 Multiple myeloma in relapse: Secondary | ICD-10-CM | POA: Diagnosis not present

## 2018-10-28 DIAGNOSIS — E559 Vitamin D deficiency, unspecified: Secondary | ICD-10-CM | POA: Diagnosis not present

## 2018-10-28 DIAGNOSIS — M8580 Other specified disorders of bone density and structure, unspecified site: Secondary | ICD-10-CM | POA: Diagnosis not present

## 2018-10-28 DIAGNOSIS — J40 Bronchitis, not specified as acute or chronic: Secondary | ICD-10-CM

## 2018-10-28 DIAGNOSIS — M810 Age-related osteoporosis without current pathological fracture: Secondary | ICD-10-CM

## 2018-10-28 MED ORDER — PROCHLORPERAZINE MALEATE 10 MG PO TABS: 10.0000 mg | ORAL_TABLET | Freq: Four times a day (QID) | ORAL | 1 refills | Status: AC | PRN

## 2018-10-28 MED ORDER — DEXAMETHASONE 4 MG PO TABS: ORAL_TABLET | ORAL | 4 refills | Status: AC

## 2018-10-28 MED ORDER — ONDANSETRON HCL 8 MG PO TABS: 8.0000 mg | ORAL_TABLET | Freq: Two times a day (BID) | ORAL | 1 refills | Status: AC | PRN

## 2018-10-28 MED ORDER — ACYCLOVIR 400 MG PO TABS: 400.0000 mg | ORAL_TABLET | Freq: Every day | ORAL | 3 refills | Status: AC

## 2018-10-28 NOTE — Progress Notes (Addendum)
Chief Complaint  Patient presents with  . Follow-up   HFU 1. MM uncontrolled stopping Pomalyst and changing to Carfilzomib and DXMS per Dr. Amalia Hailey at [facility] will get infusion with Dr. Tasia Catchings. Has chronic back pain Xray 10/27/2018 below [facility] consider MRI  IMPRESSION: Mild compression deformities involving the L2-L5 vertebral bodies, likely chronic. No definite acute osseous findings.  Moderate spondylosis of the lumbar spine. 2. 1/2-10/23/2018 in hospital for bronchitis given 3 days outpt of doxycycline stopped 10/26/2018 feeling better still with productive cough no wheezing tessalon is helping. She still feels tired CXR in hospital chronic scarring lungs K was 2.6 by time of discharge 4.4    Review of Systems  Constitutional: Positive for malaise/fatigue. Negative for weight loss.  HENT: Negative for hearing loss.   Respiratory: Positive for cough and sputum production. Negative for wheezing.   Cardiovascular: Negative for chest pain.  Musculoskeletal: Positive for back pain.  Skin: Negative for rash.  Psychiatric/Behavioral: Negative for memory loss.   Past Medical History:  Diagnosis Date  . Ascites   . Asthma   . Bilateral leg edema   . Cancer ([place])   . Chicken pox   . Colon polyps   . Diverticulitis    with perforation 02/2017 and hosp with colostomy bag s/p removal   . Drug-induced neutropenia ([place]) 03/09/2018  . GERD (gastroesophageal reflux disease)   . Hyperlipidemia   . Hypertension   . Multiple myeloma ([place])    dx'ed 01/2018 follows Dr. Tasia Catchings and [facility] H/O   . Pleural effusion   . Pneumonia    03/02/18   . Thyroid nodule   . UTI (urinary tract infection)   . Vitamin D deficiency    Past Surgical History:  Procedure Laterality Date  . ABDOMINAL HYSTERECTOMY    . BREAST BIOPSY Bilateral yrs ago   benign  . CHOLECYSTECTOMY    . COLOSTOMY  2018  . COLOSTOMY REVERSAL     11 2018  . KYPHOPLASTY     11/2017 [place]    Family History  Problem Relation Age of Onset  .  Breast cancer Mother 53  . Cancer Mother        breast   . Diabetes Mother   . Heart disease Mother   . Hyperlipidemia Mother   . Hypertension Mother   . Birth defects Brother   . Diabetes Brother   . Heart disease Brother   . Hyperlipidemia Brother   . Cancer Father        prostate met to liver    Social History   Socioeconomic History  . Marital status: Married    Spouse name: Not on file  . Number of children: Not on file  . Years of education: Not on file  . Highest education level: Not on file  Occupational History  . Not on file  Social Needs  . Financial resource strain: Not hard at all  . Food insecurity:    Worry: Never true    Inability: Never true  . Transportation needs:    Medical: No    Non-medical: No  Tobacco Use  . Smoking status: Former Research scientist (life sciences)  . Smokeless tobacco: Never Used  Substance and Sexual Activity  . Alcohol use: Not Currently  . Drug use: Never  . Sexual activity: Yes    Comment: husband   Lifestyle  . Physical activity:    Days per week: 0 days    Minutes per session: 0 min  . Stress: Not at all  Relationships  . Social connections:    Talks on phone: More than three times a week    Gets together: Once a week    Attends religious service: 1 to 4 times per year    Active member of club or organization: No    Attends meetings of clubs or organizations: Never    Relationship status: Married  . Intimate partner violence:    Fear of current or ex partner: No    Emotionally abused: No    Physically abused: No    Forced sexual activity: No  Other Topics Concern  . Not on file  Social History Narrative   Married    Husband lives in [place] while she gets treatment in this area    She lives with sister in law when receiving treatment   Current Meds  Medication Sig  . acetaminophen (TYLENOL) 500 MG tablet Take 1,000 mg by mouth every 6 (six) hours as needed for mild pain or fever.  Marland Kitchen albuterol (VENTOLIN HFA) 108 (90 Base)  MCG/ACT inhaler Inhale 2 puffs into the lungs every 6 (six) hours as needed.  Marland Kitchen apixaban (ELIQUIS) 5 MG TABS tablet Take 1 tablet (5 mg total) by mouth 2 (two) times daily.  . benzonatate (TESSALON) 100 MG capsule Take 100 mg by mouth 3 (three) times daily.  . calcium-vitamin D (OSCAL WITH D) 500-200 MG-UNIT tablet Take 1 tablet by mouth daily.  . Cholecalciferol 50000 units capsule Take 1 capsule (50,000 Units total) by mouth once a week.  Marland Kitchen dexamethasone (DECADRON) 4 MG tablet Take 5 tablets (20 mg total) by mouth once a week.  . feeding supplement, ENSURE ENLIVE, (ENSURE ENLIVE) LIQD Take 237 mLs by mouth 3 (three) times daily between meals.  . fluticasone (FLONASE) 50 MCG/ACT nasal spray Place 1-2 sprays into both nostrils daily. (Patient taking differently: Place 1-2 sprays into both nostrils daily as needed. )  . fluticasone (FLOVENT DISKUS) 50 MCG/BLIST diskus inhaler Inhale 1 puff into the lungs 2 (two) times daily. (Patient taking differently: Inhale 1 puff into the lungs 2 (two) times daily as needed. )  . furosemide (LASIX) 20 MG tablet Take 1 tablet (20 mg total) by mouth daily as needed.  . loperamide (IMODIUM A-D) 2 MG tablet Take 2 mg by mouth 4 (four) times daily as needed for diarrhea or loose stools.  . montelukast (SINGULAIR) 10 MG tablet Take 10 mg by mouth at bedtime.  . ondansetron (ZOFRAN) 8 MG tablet Take 1 tablet (8 mg total) by mouth every 8 (eight) hours as needed for nausea.  Marland Kitchen oxyCODONE (OXY IR/ROXICODONE) 5 MG immediate release tablet Take 1 tablet (5 mg total) by mouth every 8 (eight) hours as needed for moderate pain.  . pantoprazole (PROTONIX) 40 MG tablet Take 1 tablet (40 mg total) by mouth every morning. 30 minutes before food  . pomalidomide (POMALYST) 3 MG capsule TAKE 1 CAPSULE ('3MG'$ ) BY MOUTH EVERY DAY TAKE WITH WATER ON DAYS 1-21 REPEAT EVERY 28  . potassium chloride SA (K-DUR,KLOR-CON) 20 MEQ tablet Take 1 tablet (20 mEq total) by mouth daily.  . pravastatin  (PRAVACHOL) 10 MG tablet Take 1 tablet by mouth at bedtime.  . prochlorperazine (COMPAZINE) 10 MG tablet Take 10 mg by mouth every 6 (six) hours as needed for nausea.  Marland Kitchen spironolactone (ALDACTONE) 25 MG tablet Take 25 mg by mouth daily.  Marland Kitchen tiotropium (SPIRIVA) 18 MCG inhalation capsule Place 1 capsule (18 mcg total) into inhaler and inhale daily.  Allergies  Allergen Reactions  . Codeine Nausea And Vomiting   Recent Results (from the past 2160 hour(s))  Kappa/lambda light chains     Status: Abnormal   Collection Time: 08/30/18  8:27 AM  Result Value Ref Range   Kappa free light chain 1.4 (L) 3.3 - 19.4 mg/L   Lamda free light chains 93.5 (H) 5.7 - 26.3 mg/L   Kappa, lamda light chain ratio 0.01 (L) 0.26 - 1.65    Comment: (NOTE) Performed At: [facility] [place], [place] [id number] Rush Farmer MD [id number]   Multiple Myeloma Panel (SPEP&IFE w/QIG)     Status: Abnormal   Collection Time: 08/30/18  8:27 AM  Result Value Ref Range   IgG (Immunoglobin G), Serum 994 700 - 1,600 mg/dL   IgA 6 (L) 64 - 422 mg/dL    Comment: Result confirmed on concentration.   IgM (Immunoglobulin M), Srm 9 (L) 26 - 217 mg/dL    Comment: Result confirmed on concentration.   Total Protein ELP 5.7 (L) 6.0 - 8.5 g/dL   Albumin SerPl Elph-Mcnc 3.1 2.9 - 4.4 g/dL   Alpha 1 0.2 0.0 - 0.4 g/dL   Alpha2 Glob SerPl Elph-Mcnc 0.8 0.4 - 1.0 g/dL   B-Globulin SerPl Elph-Mcnc 0.8 0.7 - 1.3 g/dL   Gamma Glob SerPl Elph-Mcnc 0.7 0.4 - 1.8 g/dL   M Protein SerPl Elph-Mcnc 0.6 (H) Not Observed g/dL    Comment: An additional m spike was observed at a concentration of 0.1 g/dl    Globulin, Total 2.6 2.2 - 3.9 g/dL   Albumin/Glob SerPl 1.2 0.7 - 1.7   IFE 1 Comment     Comment: (NOTE) Immunofixation shows IgG monoclonal protein with lambda light chain specificity. Immunofixation shows IgG monoclonal protein with kappa light chain specificity. Please note that samples from patients  receiving DARZALEX(R) (daratumumab) treatment can appear as an "IgG kappa" and mask a complete response. If this patient is receiving DARA, this IFE assay interference can be removed by ordering test number [id number]-"Immunofixation, Daratumumab-Specific, Serum" and submitting a new sample for testing or by calling the lab to add this test to the current sample.    Please Note Comment     Comment: (NOTE) Protein electrophoresis scan will follow via computer, mail, or courier delivery. Performed At: [facility] [place], [place] [id number] Rush Farmer MD [id number]   Comprehensive metabolic panel     Status: Abnormal   Collection Time: 08/30/18  8:27 AM  Result Value Ref Range   Sodium 139 135 - 145 mmol/L   Potassium 3.0 (L) 3.5 - 5.1 mmol/L   Chloride 105 98 - 111 mmol/L   CO2 26 22 - 32 mmol/L   Glucose, Bld 154 (H) 70 - 99 mg/dL   BUN 9 8 - 23 mg/dL   Creatinine, Ser 0.79 0.44 - 1.00 mg/dL   Calcium 8.7 (L) 8.9 - 10.3 mg/dL   Total Protein 6.6 6.5 - 8.1 g/dL   Albumin 3.4 (L) 3.5 - 5.0 g/dL   AST 20 15 - 41 U/L   ALT 20 0 - 44 U/L   Alkaline Phosphatase 46 38 - 126 U/L   Total Bilirubin 0.9 0.3 - 1.2 mg/dL   GFR calc non Af Amer >60 >60 mL/min   GFR calc Af Amer >60 >60 mL/min    Comment: (NOTE) The eGFR has been calculated using the CKD EPI equation. This calculation has not been validated in all clinical situations. eGFR's  persistently <60 mL/min signify possible Chronic Kidney Disease.    Anion gap 8 5 - 15    Comment: Performed at [facility], [address] [place], [place] [address]  CBC with Differential/Platelet     Status: Abnormal   Collection Time: 08/30/18  8:27 AM  Result Value Ref Range   WBC 5.0 4.0 - 10.5 K/uL   RBC 3.63 (L) 3.87 - 5.11 MIL/uL   Hemoglobin 10.5 (L) 12.0 - 15.0 g/dL   HCT 33.1 (L) 36.0 - 46.0 %   MCV 91.2 80.0 - 100.0 fL   MCH 28.9 26.0 - 34.0 pg   MCHC 31.7 30.0 - 36.0 g/dL   RDW 15.2 11.5 -  15.5 %   Platelets 210 150 - 400 K/uL   nRBC 0.0 0.0 - 0.2 %   Neutrophils Relative % 51 %   Neutro Abs 2.5 1.7 - 7.7 K/uL   Lymphocytes Relative 25 %   Lymphs Abs 1.3 0.7 - 4.0 K/uL   Monocytes Relative 20 %   Monocytes Absolute 1.0 0.1 - 1.0 K/uL   Eosinophils Relative 3 %   Eosinophils Absolute 0.1 0.0 - 0.5 K/uL   Basophils Relative 1 %   Basophils Absolute 0.0 0.0 - 0.1 K/uL   Immature Granulocytes 0 %   Abs Immature Granulocytes 0.02 0.00 - 0.07 K/uL    Comment: Performed at [facility], [address] [place], [place] [address]  Kappa/lambda light chains     Status: Abnormal   Collection Time: 09/28/18  9:11 AM  Result Value Ref Range   Kappa free light chain 1.7 (L) 3.3 - 19.4 mg/L   Lamda free light chains 289.7 (H) 5.7 - 26.3 mg/L   Kappa, lamda light chain ratio 0.01 (L) 0.26 - 1.65    Comment: (NOTE) Performed At: [facility] [place], [place] [id number] Rush Farmer MD [id number]   Multiple Myeloma Panel (SPEP&IFE w/QIG)     Status: Abnormal   Collection Time: 09/28/18  9:11 AM  Result Value Ref Range   IgG (Immunoglobin G), Serum 1,717 (H) 700 - 1,600 mg/dL   IgA 5 (L) 64 - 422 mg/dL    Comment: Result confirmed on concentration.   IgM (Immunoglobulin M), Srm 9 (L) 26 - 217 mg/dL    Comment: Result confirmed on concentration.   Total Protein ELP 6.2 6.0 - 8.5 g/dL   Albumin SerPl Elph-Mcnc 3.2 2.9 - 4.4 g/dL   Alpha 1 0.2 0.0 - 0.4 g/dL   Alpha2 Glob SerPl Elph-Mcnc 0.8 0.4 - 1.0 g/dL   B-Globulin SerPl Elph-Mcnc 0.8 0.7 - 1.3 g/dL   Gamma Glob SerPl Elph-Mcnc 1.2 0.4 - 1.8 g/dL   M Protein SerPl Elph-Mcnc 1.1 (H) Not Observed g/dL   Globulin, Total 3.0 2.2 - 3.9 g/dL   Albumin/Glob SerPl 1.1 0.7 - 1.7   IFE 1 Comment     Comment: (NOTE) Immunofixation shows IgG monoclonal protein with lambda light chain specificity.    Please Note Comment     Comment: (NOTE) Protein electrophoresis scan will follow via computer,  mail, or courier delivery. Performed At: [facility] [place], [place] [id number] Rush Farmer MD [id number]   Comprehensive metabolic panel     Status: Abnormal   Collection Time: 09/28/18  9:11 AM  Result Value Ref Range   Sodium 139 135 - 145 mmol/L   Potassium 3.4 (L) 3.5 - 5.1 mmol/L   Chloride 107 98 - 111 mmol/L  CO2 26 22 - 32 mmol/L   Glucose, Bld 96 70 - 99 mg/dL   BUN 13 8 - 23 mg/dL   Creatinine, Ser 0.80 0.44 - 1.00 mg/dL   Calcium 9.4 8.9 - 10.3 mg/dL   Total Protein 6.8 6.5 - 8.1 g/dL   Albumin 3.3 (L) 3.5 - 5.0 g/dL   AST 13 (L) 15 - 41 U/L   ALT 17 0 - 44 U/L   Alkaline Phosphatase 42 38 - 126 U/L   Total Bilirubin 0.9 0.3 - 1.2 mg/dL   GFR calc non Af Amer >60 >60 mL/min   GFR calc Af Amer >60 >60 mL/min   Anion gap 6 5 - 15    Comment: Performed at [facility], [address] [place], [place] [address]  CBC with Differential/Platelet     Status: Abnormal   Collection Time: 09/28/18  9:11 AM  Result Value Ref Range   WBC 4.2 4.0 - 10.5 K/uL   RBC 3.87 3.87 - 5.11 MIL/uL   Hemoglobin 11.2 (L) 12.0 - 15.0 g/dL   HCT 35.1 (L) 36.0 - 46.0 %   MCV 90.7 80.0 - 100.0 fL   MCH 28.9 26.0 - 34.0 pg   MCHC 31.9 30.0 - 36.0 g/dL   RDW 14.7 11.5 - 15.5 %   Platelets 104 (L) 150 - 400 K/uL   nRBC 0.0 0.0 - 0.2 %   Neutrophils Relative % 25 %   Neutro Abs 1.1 (L) 1.7 - 7.7 K/uL   Lymphocytes Relative 25 %   Lymphs Abs 1.0 0.7 - 4.0 K/uL   Monocytes Relative 27 %   Monocytes Absolute 1.2 (H) 0.1 - 1.0 K/uL   Eosinophils Relative 21 %   Eosinophils Absolute 0.9 (H) 0.0 - 0.5 K/uL   Basophils Relative 1 %   Basophils Absolute 0.0 0.0 - 0.1 K/uL   Immature Granulocytes 1 %   Abs Immature Granulocytes 0.02 0.00 - 0.07 K/uL    Comment: Performed at [facility], [address] [place], [place] [address]  Basic metabolic panel     Status: Abnormal   Collection Time: 10/21/18  2:25 PM  Result Value Ref Range   Sodium 141  135 - 145 mmol/L   Potassium 2.6 (LL) 3.5 - 5.1 mmol/L    Comment: CRITICAL RESULT CALLED TO, READ BACK BY AND VERIFIED WITH STEPHANIE RUDD 10/21/18 1505 KLW    Chloride 109 98 - 111 mmol/L   CO2 25 22 - 32 mmol/L   Glucose, Bld 137 (H) 70 - 99 mg/dL   BUN 16 8 - 23 mg/dL   Creatinine, Ser 0.76 0.44 - 1.00 mg/dL   Calcium 8.6 (L) 8.9 - 10.3 mg/dL   GFR calc non Af Amer >60 >60 mL/min   GFR calc Af Amer >60 >60 mL/min   Anion gap 7 5 - 15    Comment: Performed at [facility], [address] [place], [place] [address]  CBC     Status: Abnormal   Collection Time: 10/21/18  2:25 PM  Result Value Ref Range   WBC 4.8 4.0 - 10.5 K/uL   RBC 3.83 (L) 3.87 - 5.11 MIL/uL   Hemoglobin 11.2 (L) 12.0 - 15.0 g/dL   HCT 35.1 (L) 36.0 - 46.0 %   MCV 91.6 80.0 - 100.0 fL   MCH 29.2 26.0 - 34.0 pg   MCHC 31.9 30.0 - 36.0 g/dL   RDW 15.0 11.5 - 15.5 %   Platelets  103 (L) 150 - 400 K/uL    Comment: Immature Platelet Fraction may be clinically indicated, consider ordering this additional test [id number]    nRBC 0.0 0.0 - 0.2 %    Comment: Performed at [facility], [address] [place], [place] [address]  Troponin I - ONCE - STAT     Status: None   Collection Time: 10/21/18  2:25 PM  Result Value Ref Range   Troponin I <0.03 <0.03 ng/mL    Comment: Performed at [facility], [address] [place], [place] [address]  Magnesium     Status: None   Collection Time: 10/21/18  2:25 PM  Result Value Ref Range   Magnesium 2.1 1.7 - 2.4 mg/dL    Comment: Performed at [facility], [address] [place], [place] [address]  Basic metabolic panel     Status: Abnormal   Collection Time: 10/22/18  6:46 AM  Result Value Ref Range   Sodium 140 135 - 145 mmol/L   Potassium 4.4 3.5 - 5.1 mmol/L   Chloride 111 98 - 111 mmol/L   CO2 23 22 - 32 mmol/L   Glucose, Bld 147 (H) 70 - 99 mg/dL   BUN 16 8 - 23 mg/dL   Creatinine, Ser 0.75 0.44 - 1.00 mg/dL   Calcium  8.8 (L) 8.9 - 10.3 mg/dL   GFR calc non Af Amer >60 >60 mL/min   GFR calc Af Amer >60 >60 mL/min   Anion gap 6 5 - 15    Comment: Performed at [facility], [address] [place],  [address]  CBC     Status: Abnormal   Collection Time: 10/22/18  6:46 AM  Result Value Ref Range   WBC 4.8 4.0 - 10.5 K/uL   RBC 3.73 (L) 3.87 - 5.11 MIL/uL   Hemoglobin 10.9 (L) 12.0 - 15.0 g/dL   HCT 33.7 (L) 36.0 - 46.0 %   MCV 90.3 80.0 - 100.0 fL   MCH 29.2 26.0 - 34.0 pg   MCHC 32.3 30.0 - 36.0 g/dL   RDW 14.7 11.5 - 15.5 %   Platelets 102 (L) 150 - 400 K/uL    Comment: Immature Platelet Fraction may be clinically indicated, consider ordering this additional test [id number]    nRBC 0.0 0.0 - 0.2 %    Comment: Performed at [facility], [address] [place],  [address]   Objective  Body mass index is 27.84 kg/m. Wt Readings from Last 3 Encounters:  10/28/18 152 lb 3.2 oz (69 kg)  10/23/18 146 lb 13.2 oz (66.6 kg)  09/28/18 146 lb 9.6 oz (66.5 kg)   Temp Readings from Last 3 Encounters:  10/28/18 97.8 F (36.6 C) (Oral)  10/23/18 (!) 97.5 F (36.4 C) (Oral)  09/28/18 (!) 96.5 F (35.8 C) (Tympanic)   BP Readings from Last 3 Encounters:  10/28/18 122/88  10/23/18 (!) 148/75  09/28/18 119/69   Pulse Readings from Last 3 Encounters:  10/28/18 80  10/23/18 70  09/28/18 66    Physical Exam Vitals signs and nursing note reviewed.  Constitutional:      Appearance: Normal appearance. She is well-developed.  HENT:     Head: Normocephalic and atraumatic.     Nose: Nose normal.     Mouth/Throat:     Mouth: Mucous membranes are moist.     Pharynx: Oropharynx is clear.  Eyes:     Conjunctiva/sclera: Conjunctivae normal.     Pupils: Pupils  are equal, round, and reactive to light.  Cardiovascular:     Rate and Rhythm: Normal rate and regular rhythm.     Heart sounds: Normal heart sounds.  Pulmonary:     Effort: Pulmonary effort is normal.      Breath sounds: Normal breath sounds. No wheezing.  Skin:    General: Skin is warm and dry.  Neurological:     General: No focal deficit present.     Mental Status: She is alert and oriented to person, place, and time.     Gait: Gait normal.  Psychiatric:        Attention and Perception: Attention and perception normal.        Mood and Affect: Mood and affect normal.        Speech: Speech normal.        Behavior: Behavior normal. Behavior is cooperative.        Thought Content: Thought content normal.        Cognition and Memory: Cognition and memory normal.        Judgment: Judgment normal.     Assessment   1. MM uncontrolled with chronic back pain and abnormal Xray 10/27/2018 see HPI  2. Bronchitis improved 3. HM Plan   1.  saw H/o 10/27/2018 [facility] stopping Pomalyst and changing to Carfilzomib and DXMS per Dr. Amalia Hailey at [facility] will get infusion with Dr. Tasia Catchings. Has chronic back pain Xray 10/27/2018 below [facility] consider MRI  2. rec take tessalon perles and otc robitussin DM  Completed doxycycline course  3.  Had flu shot  Tdap 10/25/15  pna 23 utd consider prevnar 08/26/2019 if ok with H/o  Need to check other vaccines shingrix, prevnar  mammo 05/17/18 normal   Colonoscopy per pt had 3-4 years ago rectal bleeding h/o sigmoid colon resection  -declines for now  Pap sp hysterectomy endometrosis DEXA will disc in future per pt had 06/2014 osteopenia will need to check vit D in future -DEXA 12/02/17 osteoporosis -2.5 L1/4 and -2.8 L3 and -2.9 L4, -2.7 left neck femur  -per pt had [facility] Resides ortho [place] 2019  -need to get records sent release again today 2nd request  Vit D def rec D3 5000 IU qd   F/u [facility] H/O Dr. Amalia Hailey 03/10/18 disc Zometa start  Provider: Dr. Olivia Mackie McLean-Scocuzza-Internal Medicine

## 2018-10-28 NOTE — Patient Instructions (Addendum)
Xray 10/27/2018 lower back looks like Dr. Amalia Hailey is considering MRI of low back   FINDINGS:   Counting reference: Lumbosacral junction. For the purposes of this report, L4-5 is considered the level of the iliac crest.     Levocurvature of the lumbar spine. No listhesis. Mild to moderate multilevel compression deformities involving the L2-L5 vertebral bodies which appear chronic. Findings of vertebroplasty are noted in the L3 vertebral body. Mild multilevel discogenic degenerative change. Moderate to advanced facet arthrosis in the lower lumbar spine. No well delineated focal lytic lesion in the visualized osseous structures.     Pick up over the counter medications 1. Vitamin D3 5000 IU daily  2.  Robitussin DM for cough with Tessalon Perles for cough  Cough drops Halls lemon and honey  Warm tea with honey and lemon  Rest    Vitamin D Deficiency Vitamin D deficiency is when your body does not have enough vitamin D. Vitamin D is important to your body for many reasons:  It helps the body to absorb two important minerals, called calcium and phosphorus.  It plays a role in bone health.  It may help to prevent some diseases, such as diabetes and multiple sclerosis.  It plays a role in muscle function, including heart function. You can get vitamin D by:  Eating foods that naturally contain vitamin D.  Eating or drinking milk or other dairy products that have vitamin D added to them.  Taking a vitamin D supplement or a multivitamin supplement that contains vitamin D.  Being in the sun. Your body naturally makes vitamin D when your skin is exposed to sunlight. Your body changes the sunlight into a form of the vitamin that the body can use. If vitamin D deficiency is severe, it can cause a condition in which your bones become soft. In adults, this condition is called osteomalacia. In children, this condition is called rickets. What are the causes? Vitamin D deficiency may be caused  by:  Not eating enough foods that contain vitamin D.  Not getting enough sun exposure.  Having certain digestive system diseases that make it difficult for your body to absorb vitamin D. These diseases include Crohn disease, chronic pancreatitis, and cystic fibrosis.  Having a surgery in which a part of the stomach or a part of the small intestine is removed.  Being obese.  Having chronic kidney disease or liver disease. What increases the risk? This condition is more likely to develop in:  Older people.  People who do not spend much time outdoors.  People who live in a long-term care facility.  People who have had broken bones.  People with weak or thin bones (osteoporosis).  People who have a disease or condition that changes how the body absorbs vitamin D.  People who have dark skin.  People who take certain medicines, such as steroid medicines or certain seizure medicines.  People who are overweight or obese. What are the signs or symptoms? In mild cases of vitamin D deficiency, there may not be any symptoms. If the condition is severe, symptoms may include:  Bone pain.  Muscle pain.  Falling often.  Broken bones caused by a minor injury. How is this diagnosed? This condition is usually diagnosed with a blood test. How is this treated? Treatment for this condition may depend on what caused the condition. Treatment options include:  Taking vitamin D supplements.  Taking a calcium supplement. Your health care provider will suggest what dose is best for  you. Follow these instructions at home:  Take medicines and supplements only as told by your health care provider.  Eat foods that contain vitamin D. Choices include: ? Fortified dairy products, cereals, or juices. Fortified means that vitamin D has been added to the food. Check the label on the package to be sure. ? Fatty fish, such as salmon or trout. ? Eggs. ? Oysters.  Do not use a tanning  bed.  Maintain a healthy weight. Lose weight, if needed.  Keep all follow-up visits as told by your health care provider. This is important. Contact a health care provider if:  Your symptoms do not go away.  You feel like throwing up (nausea) or you throw up (vomit).  You have fewer bowel movements than usual or it is difficult for you to have a bowel movement (constipation). This information is not intended to replace advice given to you by your health care provider. Make sure you discuss any questions you have with your health care provider. Document Released: 12/29/2011 Document Revised: 03/19/2016 Document Reviewed: 02/21/2015 Elsevier Interactive Patient Education  2019 [employer].

## 2018-10-28 NOTE — Progress Notes (Signed)
DISCONTINUE ON PATHWAY REGIMEN - Multiple Myeloma and Other Plasma Cell Dyscrasias     A cycle is every 28 days:     Daratumumab      Pomalidomide      Dexamethasone      Dexamethasone   **Always confirm dose/schedule in your pharmacy ordering system**  REASON: Disease Progression PRIOR TREATMENT: [id number]: DaraPd (Daratumumab + Pomalidomide + Dexamethasone) Until Progression or Unacceptable Toxicity TREATMENT RESPONSE: Progressive Disease (PD)  START ON PATHWAY REGIMEN - Multiple Myeloma and Other Plasma Cell Dyscrasias     A cycle is every 28 days:     Carfilzomib      Carfilzomib      Carfilzomib      Dexamethasone      Dexamethasone   **Always confirm dose/schedule in your pharmacy ordering system**  Patient Characteristics: Relapsed / Refractory, Second through Fourth Lines of Therapy R-ISS Staging: II Disease Classification: Relapsed Line of Therapy: Third Line Intent of Therapy: Non-Curative / Palliative Intent, Discussed with Patient

## 2018-10-28 NOTE — Progress Notes (Signed)
Pre visit review using our clinic review tool, if applicable. No additional management support is needed unless otherwise documented below in the visit note. 

## 2018-10-29 ENCOUNTER — Other Ambulatory Visit: Payer: Self-pay

## 2018-10-29 DIAGNOSIS — C9002 Multiple myeloma in relapse: Secondary | ICD-10-CM

## 2018-10-29 DIAGNOSIS — M810 Age-related osteoporosis without current pathological fracture: Secondary | ICD-10-CM | POA: Insufficient documentation

## 2018-11-01 ENCOUNTER — Other Ambulatory Visit: Payer: Self-pay | Admitting: Oncology

## 2018-11-01 ENCOUNTER — Encounter: Payer: Self-pay | Admitting: Oncology

## 2018-11-02 ENCOUNTER — Inpatient Hospital Stay: Payer: Medicare PPO | Attending: Oncology

## 2018-11-02 ENCOUNTER — Inpatient Hospital Stay (HOSPITAL_BASED_OUTPATIENT_CLINIC_OR_DEPARTMENT_OTHER): Payer: Medicare PPO | Admitting: Oncology

## 2018-11-02 ENCOUNTER — Inpatient Hospital Stay: Payer: Medicare PPO

## 2018-11-02 ENCOUNTER — Other Ambulatory Visit: Payer: Self-pay

## 2018-11-02 ENCOUNTER — Telehealth: Payer: Self-pay | Admitting: Gastroenterology

## 2018-11-02 ENCOUNTER — Encounter: Payer: Self-pay | Admitting: Oncology

## 2018-11-02 VITALS — BP 126/75 | HR 72 | Temp 96.7°F | Resp 18 | Wt 143.6 lb

## 2018-11-02 VITALS — BP 121/67 | HR 76 | Temp 97.6°F | Resp 18

## 2018-11-02 DIAGNOSIS — C7951 Secondary malignant neoplasm of bone: Secondary | ICD-10-CM | POA: Insufficient documentation

## 2018-11-02 DIAGNOSIS — Z8701 Personal history of pneumonia (recurrent): Secondary | ICD-10-CM | POA: Diagnosis not present

## 2018-11-02 DIAGNOSIS — D649 Anemia, unspecified: Secondary | ICD-10-CM | POA: Diagnosis not present

## 2018-11-02 DIAGNOSIS — Z79899 Other long term (current) drug therapy: Secondary | ICD-10-CM | POA: Diagnosis not present

## 2018-11-02 DIAGNOSIS — Z7901 Long term (current) use of anticoagulants: Secondary | ICD-10-CM

## 2018-11-02 DIAGNOSIS — C9002 Multiple myeloma in relapse: Secondary | ICD-10-CM

## 2018-11-02 DIAGNOSIS — I10 Essential (primary) hypertension: Secondary | ICD-10-CM | POA: Diagnosis not present

## 2018-11-02 DIAGNOSIS — Z87891 Personal history of nicotine dependence: Secondary | ICD-10-CM | POA: Diagnosis not present

## 2018-11-02 DIAGNOSIS — E785 Hyperlipidemia, unspecified: Secondary | ICD-10-CM | POA: Diagnosis not present

## 2018-11-02 DIAGNOSIS — Z5111 Encounter for antineoplastic chemotherapy: Secondary | ICD-10-CM | POA: Diagnosis not present

## 2018-11-02 DIAGNOSIS — D702 Other drug-induced agranulocytosis: Secondary | ICD-10-CM

## 2018-11-02 DIAGNOSIS — Z86718 Personal history of other venous thrombosis and embolism: Secondary | ICD-10-CM | POA: Diagnosis not present

## 2018-11-02 DIAGNOSIS — Z5112 Encounter for antineoplastic immunotherapy: Secondary | ICD-10-CM

## 2018-11-02 DIAGNOSIS — N390 Urinary tract infection, site not specified: Secondary | ICD-10-CM

## 2018-11-02 DIAGNOSIS — E876 Hypokalemia: Secondary | ICD-10-CM | POA: Diagnosis not present

## 2018-11-02 DIAGNOSIS — M549 Dorsalgia, unspecified: Secondary | ICD-10-CM

## 2018-11-02 DIAGNOSIS — R1319 Other dysphagia: Secondary | ICD-10-CM | POA: Insufficient documentation

## 2018-11-02 DIAGNOSIS — J449 Chronic obstructive pulmonary disease, unspecified: Secondary | ICD-10-CM | POA: Diagnosis not present

## 2018-11-02 DIAGNOSIS — K219 Gastro-esophageal reflux disease without esophagitis: Secondary | ICD-10-CM

## 2018-11-02 LAB — CBC WITH DIFFERENTIAL/PLATELET
ABS IMMATURE GRANULOCYTES: 0.1 10*3/uL — AB (ref 0.00–0.07)
BASOS ABS: 0 10*3/uL (ref 0.0–0.1)
Basophils Relative: 0 %
Eosinophils Absolute: 0.1 10*3/uL (ref 0.0–0.5)
Eosinophils Relative: 2 %
HCT: 33.7 % — ABNORMAL LOW (ref 36.0–46.0)
Hemoglobin: 10.7 g/dL — ABNORMAL LOW (ref 12.0–15.0)
Immature Granulocytes: 2 %
Lymphocytes Relative: 23 %
Lymphs Abs: 1.4 10*3/uL (ref 0.7–4.0)
MCH: 28.8 pg (ref 26.0–34.0)
MCHC: 31.8 g/dL (ref 30.0–36.0)
MCV: 90.6 fL (ref 80.0–100.0)
Monocytes Absolute: 1 10*3/uL (ref 0.1–1.0)
Monocytes Relative: 18 %
NRBC: 0 % (ref 0.0–0.2)
Neutro Abs: 3.2 10*3/uL (ref 1.7–7.7)
Neutrophils Relative %: 55 %
Platelets: 195 10*3/uL (ref 150–400)
RBC: 3.72 MIL/uL — ABNORMAL LOW (ref 3.87–5.11)
RDW: 14.6 % (ref 11.5–15.5)
WBC: 5.9 10*3/uL (ref 4.0–10.5)

## 2018-11-02 LAB — COMPREHENSIVE METABOLIC PANEL
ALK PHOS: 48 U/L (ref 38–126)
ALT: 19 U/L (ref 0–44)
AST: 17 U/L (ref 15–41)
Albumin: 2.9 g/dL — ABNORMAL LOW (ref 3.5–5.0)
Anion gap: 9 (ref 5–15)
BUN: 16 mg/dL (ref 8–23)
CALCIUM: 9.7 mg/dL (ref 8.9–10.3)
CO2: 25 mmol/L (ref 22–32)
Chloride: 104 mmol/L (ref 98–111)
Creatinine, Ser: 0.92 mg/dL (ref 0.44–1.00)
GFR calc Af Amer: >60 mL/min (ref >60)
GFR calc non Af Amer: >60 mL/min (ref >60)
Glucose, Bld: 97 mg/dL (ref 70–99)
Potassium: 4.1 mmol/L (ref 3.5–5.1)
Sodium: 138 mmol/L (ref 135–145)
TOTAL PROTEIN: 7.8 g/dL (ref 6.5–8.1)
Total Bilirubin: 0.7 mg/dL (ref 0.3–1.2)

## 2018-11-02 MED ORDER — SODIUM CHLORIDE 0.9 % IV SOLN
Freq: Once | INTRAVENOUS | Status: AC
  Administered 2018-11-02: 10:00:00 via INTRAVENOUS
  Filled 2018-11-02: qty 250

## 2018-11-02 MED ORDER — DEXTROSE 5 % IV SOLN
20.0000 mg/m2 | Freq: Once | INTRAVENOUS | Status: AC
  Administered 2018-11-02: 34 mg via INTRAVENOUS
  Filled 2018-11-02: qty 17

## 2018-11-02 MED ORDER — PROCHLORPERAZINE MALEATE 10 MG PO TABS
10.0000 mg | ORAL_TABLET | Freq: Once | ORAL | Status: AC
  Administered 2018-11-02: 10 mg via ORAL
  Filled 2018-11-02: qty 1

## 2018-11-02 NOTE — Telephone Encounter (Signed)
Ihave left a message for patient to return my call to schedule an appointment with Dr Vicente Males per [place] patient dysphagia the week on 11-08-2018

## 2018-11-02 NOTE — Progress Notes (Signed)
Patient here for follow up. Pt complaining of back pain. Her reflux is getting worse and she feels like it gets stuck in the middle of her chest.

## 2018-11-02 NOTE — Progress Notes (Signed)
Hematology/Oncology Follow up note [facility] Telephone:([phone]) [phone] Fax:([phone]) [phone]   Patient Care Team: McLean-Scocuzza, Nino Glow, MD as PCP - General (Internal Medicine)  REFERRING PROVIDER: Dr. Baruch Gouty, Donette  REASON FOR VISIT Follow up for treatment of multiple myeloma.   HISTORY OF PRESENTING ILLNESS:  Cassie Jones is a  73 y.o.  female with PMH listed below who was referred to me for evaluation of multiple myeloma.  Patient used to follow-up with local oncologist Dr. Tonia Brooms and [facility]ologist Dr. Amalia Hailey.  Patient tells me that as her husband works every day and has a busy working schedule, she is not able to get transportation to her treatment.  As an alternative she is now living with her brother who is close to [facility] [place] and want to transfer care here. Extensive medical record review was performed.    Patient was diagnosed with IgG lambda, ISS2, t (4,14) multiple myeloma on February 03, 2017, she has hemoglobin of 9, calcium 8.9, LDH 132, beta microglobulin 4.6, albumin 3.2.  Skeletal survey negative.  PET scan with single nonspecific humeral lesion and a thyroid nodule.  Marrow with 60% plasmacytosis by CD138 IHC, FISH with hyperdiploidy t(4, 14), 1 q. gain.. Per note, she was treated with CyborD (M spike 3.6 at start)  from 01/2017 to 02/2017. Her treatment was switched to Revlimid Velcade dexamethasone from 02/2017, M spike 1.2 at start,and was found to have progression of disease in 09/2017, when her M spike increased to 1.8, and developed compression fracture in 10/2016. She was restarted on RVD using weekly Velcade.  She was recently seen by Dr. Boris Sharper and recommended to start on a combination of daratumumab, Pomalyst, dexamethasone. Per patient she received her first daratumumab last week with split dose 54m/kg given on 3/14 and 3/15.  Patient reports that she tolerate the treatment well except mild infusion  reaction.  # Patient also reports that she has got dental clearance and has been given 1 dose of Zometa by Dr. VRemus Loffler(unkown dates).  [facility]  # Patient was seen by nurse practitioner last week prior to her cycle 2 daratumumab.  She reports feeling shortness of breath, having cough, believed to have bronchitis and was prescribed Levaquin 500 mg p.o. daily for 7 days, and albuterol as needed.  She also had a VQ scan low probability for pulmonary embolism.  # 01/29/2018 5th weekly treatment of Daratumumab,  Cycle 2 Pomalyst was started for a week and was nterrupted due to acute bronchitis treated with outpatient Azithromycin.    # 02/16/2018 developed right upper extremity swelling and tenderness, confirmed to be DVT. She was taking Aspirin.  Started on Eliquis. Aspirin is stopped.  # 02/16/2018 6th Daratumumab treatment. Was started on Cycle 3 Pomalyst, she has completed about 2 weeks treatment. She was admitted to hospital on 02/21/2018 for Klebsiella UTI and oncology was not consulted. She was treatment with antibiotics and continued on Pomalyst. She was hospitalized again on 03/02/2018 for pneumonia, COPD exacerbation/emphasema and oncology was consulted and patient was seen by my colleague Dr.Finnergan and advise patient to stop Pomalyst.  # [facility] recommends dose reduced Pomalyst 340m This was previously discussed with patient, and she was reluctant to be restarted on Pomalyst. Today she seems to be more acceptable for restarting Pomalyst, but requests to start in a few weeks so that she can have  a nice break.   #Previously declined bone marrow transplant evaluation.  # admitted to [facility] 08/22/2018 due to UTI.  Renal ultrasound did not show any kidney stone.  Urine culture showed pansensitive E. coli.  Patient was discharged on a course of oral ciprofloxacin.   INTERVAL HISTORY Cassie Jones is a 73 y.o. female who has above history reviewed by me presents for assessment prior to antineoplastic chemotherapy toxicity assessment.   #During the interval, patient was admitted from 10/21/2020 to  10/23/2018 due to acute bronchitis. #Multiple myeloma, recent multiple myeloma panels confirmed that she has disease progression. I called and discussed with on-call physician Dr. Evelene Croon and a communicated about patient's multiple myeloma panel. She has followed up with Dr. Amalia Hailey at [facility] on 10/27/2018 and was recommended a few options including Carfilzomib plus dexamethasone,Carfilzomib  plus dexamethasone plus Selinexor as part of clinical trial. Patient open to do carfilzomib and dexamethasone.  #Back pain, reports becoming more severe recently.  Limited ability to perform IADLs.  She uses oxycodone 5 mg twice a day which offers some relief.  Her pain regimen has been increased to 88m Q6 hours as needed by Dr.Tuchman.   X-ray obtained showed moderate spondylosis of the lumbar spine and the mild compression deformities at L2-L5.  Likely chronic. She has upcoming MRI lumbar spine scheduled.  #History of right upper extremity DVT Eliquis 5 mg twice daily.  Denies any active bleeding events. #Today she complained sensation of food stuck in the esophagus, for the past few weeks.  She has history of GERD and has been taking Protonix 40 mg every morning.  Do not feel symptoms improving.  Data reviewed:  Multiple myeloma labs  03/06/2018 SPEP showed VGPR with >90% reduction of her M protein. Normalized free light chain ratio. 05/04/2018 SPEP continued to show stable M protein, at 0.2g/L,  06/01/2018  SPEP showed M protein 0.2g/L light chain ratio 0.27 07/27/2018 SPEP showed M protien  0.4g/L light chain ration 0.05 08/30/2018 SPEP showed M protein 0.6g/L light chain ration 0.01 09/28/2018 SPEP showed M protein 1.1g/L light chain ratio 0.01   Current Treatment  # 3/14, 3/15 at outside  facility, she got split dose for first treatment of daratumumab. #  01/08/2018 Start on weekly daratumumab 153mm2, dexamethasone 2090m S/p # Palliative RT to spine. 01/08/2018 started cycle 1 Pomalyst 4mg46my 1-21 ogf 28 day cycle.  Pomalyst 4 mg was held after 3 cycles with multiple interruptions due to neutropenia and recurrent pneumonia. Pomalyst was restarted at a lower dose of 3 mg on 04/06/2018 [cycle 4] . This cycle of Pomalyst was  disrreupted for 4 days due to UTI. She was instructed to restart and was able to finish this cycle.   05/11/2018 Cycle 5 Pomalyst delayed one week as she needs to get deep dental clean before obtaining dental clearance for Xgeva.  06/01/2018  Xgeva.  06/08/2018 Cycle 6 Pomalyst 3mg 39m10/2019 Cycle 7 Pomalyst 3mg  47m15/2019 Cycle 8 Plmalyst 3mg   27m18/2019 Cycle 9 Pomalyst 3mg [de30med due to UTI].  DS[place]umumab monthly   11/02/2017 Carfilzimab and Dexamethasone.   Current Pain medication: started to take oxycodone 10mg Q6h61m  Review of Systems  Constitutional: Negative for chills, fever, malaise/fatigue and weight loss.  HENT: Negative for sore throat.   Eyes: Negative for redness.  Respiratory: Negative for cough, shortness of breath and wheezing.   Cardiovascular: Negative for chest pain, palpitations and leg swelling.  Gastrointestinal: Negative for abdominal pain, blood  in stool, nausea and vomiting.  Genitourinary: Negative for dysuria.  Musculoskeletal: Positive for back pain. Negative for myalgias.  Skin: Negative for rash.  Neurological: Negative for dizziness, tingling and tremors.  Endo/Heme/Allergies: Does not bruise/bleed easily.  Psychiatric/Behavioral: Negative for hallucinations.    MEDICAL HISTORY:  Past Medical History:  Diagnosis Date  . Ascites   . Asthma   . Bilateral leg edema   . Cancer ([place])   . Chicken pox   . Colon polyps   . Diverticulitis    with perforation 02/2017 and hosp with colostomy bag s/p removal   .  Drug-induced neutropenia ([place]) 03/09/2018  . GERD (gastroesophageal reflux disease)   . Hyperlipidemia   . Hypertension   . Multiple myeloma ([place])    dx'ed 01/2018 follows Dr. Tasia Catchings and [facility] H/O   . Pleural effusion   . Pneumonia    03/02/18   . Thyroid nodule   . UTI (urinary tract infection)   . Vitamin D deficiency     SURGICAL HISTORY: Past Surgical History:  Procedure Laterality Date  . ABDOMINAL HYSTERECTOMY    . BREAST BIOPSY Bilateral yrs ago   benign  . CHOLECYSTECTOMY    . COLOSTOMY  2018  . COLOSTOMY REVERSAL     11 2018  . KYPHOPLASTY     11/2017 [place]     SOCIAL HISTORY: Social History   Socioeconomic History  . Marital status: Married    Spouse name: Not on file  . Number of children: Not on file  . Years of education: Not on file  . Highest education level: Not on file  Occupational History  . Not on file  Social Needs  . Financial resource strain: Not hard at all  . Food insecurity:    Worry: Never true    Inability: Never true  . Transportation needs:    Medical: No    Non-medical: No  Tobacco Use  . Smoking status: Former Research scientist (life sciences)  . Smokeless tobacco: Never Used  Substance and Sexual Activity  . Alcohol use: Not Currently  . Drug use: Never  . Sexual activity: Yes    Comment: husband   Lifestyle  . Physical activity:    Days per week: 0 days    Minutes per session: 0 min  . Stress: Not at all  Relationships  . Social connections:    Talks on phone: More than three times a week    Gets together: Once a week    Attends religious service: 1 to 4 times per year    Active member of club or organization: No    Attends meetings of clubs or organizations: Never    Relationship status: Married  . Intimate partner violence:    Fear of current or ex partner: No    Emotionally abused: No    Physically abused: No    Forced sexual activity: No  Other Topics Concern  . Not on file  Social History Narrative   Married    Husband lives in  [place] while she gets treatment in this area    She lives with sister in law when receiving treatment    FAMILY HISTORY: Family History  Problem Relation Age of Onset  . Breast cancer Mother 4  . Cancer Mother        breast   . Diabetes Mother   . Heart disease Mother   . Hyperlipidemia Mother   . Hypertension Mother   . Birth defects Brother   .  Diabetes Brother   . Heart disease Brother   . Hyperlipidemia Brother   . Cancer Father        prostate met to liver     ALLERGIES:  is allergic to codeine.  MEDICATIONS:  Current Outpatient Medications  Medication Sig Dispense Refill  . acetaminophen (TYLENOL) 500 MG tablet Take 1,000 mg by mouth every 6 (six) hours as needed for mild pain or fever.    Marland Kitchen acyclovir (ZOVIRAX) 400 MG tablet Take 1 tablet (400 mg total) by mouth daily. 30 tablet 3  . albuterol (VENTOLIN HFA) 108 (90 Base) MCG/ACT inhaler Inhale 2 puffs into the lungs every 6 (six) hours as needed. 1 Inhaler 1  . apixaban (ELIQUIS) 5 MG TABS tablet Take 1 tablet (5 mg total) by mouth 2 (two) times daily. 60 tablet 3  . benzonatate (TESSALON) 100 MG capsule Take 100 mg by mouth 3 (three) times daily as needed.     . calcium-vitamin D (OSCAL WITH D) 500-200 MG-UNIT tablet Take 1 tablet by mouth daily. 30 tablet 0  . Cholecalciferol 50000 units capsule Take 1 capsule (50,000 Units total) by mouth once a week. 13 capsule 1  . dexamethasone (DECADRON) 4 MG tablet Take 10 tablets (52m) on days 1,8,15, and 22 of cycles 1-9. Then take on days 1,8,15 of cycles 10 and beyond. 40 tablet 4  . feeding supplement, ENSURE ENLIVE, (ENSURE ENLIVE) LIQD Take 237 mLs by mouth 3 (three) times daily between meals. 237 mL 12  . fluticasone (FLONASE) 50 MCG/ACT nasal spray Place 1-2 sprays into both nostrils daily. (Patient taking differently: Place 1-2 sprays into both nostrils daily as needed. ) 16 g 2  . fluticasone (FLOVENT DISKUS) 50 MCG/BLIST diskus inhaler Inhale 1 puff into the lungs  2 (two) times daily. (Patient taking differently: Inhale 1 puff into the lungs 2 (two) times daily as needed. ) 1 Inhaler 3  . furosemide (LASIX) 20 MG tablet Take 1 tablet (20 mg total) by mouth daily as needed. 30 tablet 2  . loperamide (IMODIUM A-D) 2 MG tablet Take 2 mg by mouth 4 (four) times daily as needed for diarrhea or loose stools.    . montelukast (SINGULAIR) 10 MG tablet Take 10 mg by mouth at bedtime.    . ondansetron (ZOFRAN) 8 MG tablet Take 1 tablet (8 mg total) by mouth 2 (two) times daily as needed (Nausea or vomiting). 30 tablet 1  . oxyCODONE (OXY IR/ROXICODONE) 5 MG immediate release tablet Take 1 tablet (5 mg total) by mouth every 8 (eight) hours as needed for moderate pain. (Patient taking differently: Take 5-10 mg by mouth every 6 (six) hours as needed for moderate pain. ) 30 tablet 0  . pantoprazole (PROTONIX) 40 MG tablet Take 1 tablet (40 mg total) by mouth every morning. 30 minutes before food 90 tablet 1  . potassium chloride SA (K-DUR,KLOR-CON) 20 MEQ tablet Take 1 tablet (20 mEq total) by mouth daily. 30 tablet 2  . pravastatin (PRAVACHOL) 10 MG tablet Take 1 tablet by mouth at bedtime.    . prochlorperazine (COMPAZINE) 10 MG tablet Take 1 tablet (10 mg total) by mouth every 6 (six) hours as needed (Nausea or vomiting). 30 tablet 1  . spironolactone (ALDACTONE) 25 MG tablet Take 25 mg by mouth daily.    .Marland Kitchentiotropium (SPIRIVA) 18 MCG inhalation capsule Place 1 capsule (18 mcg total) into inhaler and inhale daily. 30 capsule 6   No current facility-administered medications for this visit.  PHYSICAL EXAMINATION: ECOG PERFORMANCE STATUS: 1 - Symptomatic but completely ambulatory Vitals:   11/02/18 0924  BP: 126/75  Pulse: 72  Resp: 18  Temp: (!) 96.7 F (35.9 C)   Filed Weights   11/02/18 0924  Weight: 143 lb 9.6 oz (65.1 kg)    Physical Exam  Constitutional: She is oriented to person, place, and time. No distress.  HENT:  Head: Normocephalic and  atraumatic.  Nose: Nose normal.  Mouth/Throat: Oropharynx is clear and moist. No oropharyngeal exudate.  Eyes: Pupils are equal, round, and reactive to light. EOM are normal. No scleral icterus.  Neck: Normal range of motion. Neck supple.  Cardiovascular: Normal rate and regular rhythm.  No murmur heard. Pulmonary/Chest: Effort normal. No respiratory distress. She has no rales. She exhibits no tenderness.  Abdominal: Soft. She exhibits no distension. There is no abdominal tenderness.  Musculoskeletal: Normal range of motion.        General: No edema.  Neurological: She is alert and oriented to person, place, and time. No cranial nerve deficit. She exhibits normal muscle tone. Coordination normal.  Skin: Skin is warm and dry. She is not diaphoretic. No erythema.  Psychiatric: Affect normal.     LABORATORY DATA:  I have reviewed the data as listed Lab Results  Component Value Date   WBC 5.9 11/02/2018   HGB 10.7 (L) 11/02/2018   HCT 33.7 (L) 11/02/2018   MCV 90.6 11/02/2018   PLT 195 11/02/2018   Recent Labs    08/30/18 0827 09/28/18 0911 10/21/18 1425 10/22/18 0646 11/02/18 0905  NA 139 139 141 140 138  K 3.0* 3.4* 2.6* 4.4 4.1  CL 105 107 109 111 104  CO2 _0 GLUCOSE 154* 96 137* 147* 97  BUN _1 CREATININE 0.79 0.80 0.76 0.75 0.92  CALCIUM 8.7* 9.4 8.6* 8.8* 9.7  GFRNONAA >60 >60 >60 >60 >60  GFRAA >60 >60 >60 >60 >60  PROT 6.6 6.8  --   --  7.8  ALBUMIN 3.4* 3.3*  --   --  2.9*  AST 20 13*  --   --  17  ALT 20 17  --   --  19  ALKPHOS 46 42  --   --  48  BILITOT 0.9 0.9  --   --  0.7   RADIOGRAPHIC STUDIES: I have personally reviewed the radiological images as listed and agreed with the findings in the report. 08/23/2018  US KIDNEY Unremarkable ultrasound examination of the kidneys, bladder and retroperitoneum  07/09/2018 BONE SCAN No focal radiotracer accumulation within the axillary or appendicular skeleton to localize multiple  myeloma. Mild scoliosis of the lumbar spine. IMPRESSION: No evidence of multiple myeloma.   ASSESSMENT & PLAN:  1. Multiple myeloma in relapse ([place])   2. Bone metastasis ([place])   3. Encounter for antineoplastic chemotherapy   4. Normocytic anemia   5. History of DVT (deep vein thrombosis)    #Multiple myeloma:  IgG Lamda Disease progressed.  She has been seen by [facility] Dr. Amalia Hailey and was recommended to switch to next line of treatment with carfilzomib and dexamethasone.  Clinical trial was offered and [facility] and patient declined. Labs are reviewed and discussed with patient. Chemotherapy education was provided.  We had discussed the composition of visit Mab and dexamethasone regimen, length of chemo cycle, duration of treatment and the time to assess response to treatment.  I explained to the patient the risks and benefits of  chemotherapy including all but not limited to hair loss, mouth sore, nausea, vomiting, low blood counts, bleeding, and risk of life threatening infection and even death, secondary malignancy etc.  Patient voices understanding and willing to proceed carfilzomib Will proceed with cycle 1 day 1 treatment today.  She has taken dexamethasone 40 mg today.  Patient expresses her wishes to go back to her previous local oncologist.  She was referred by her local oncologist to me as her husband was not able to take care of her at that time and she moved closer to other family members to receive her oncology here at [facility].  Now her husband has more flexibility and patient wants to switch her care back to local.  I advised patient to give her local cancer center a call and I am happy to send any documents that as needed to facilitate her switch.  Meanwhile we will continue treatments and to she reestablish care with local oncologist.  #Dysphagia, esophageal phase, chronic history of GERD on PPI.  Symptom is getting worse.  Recommend patient to establish care with  gastroenterology for EGD evaluation.  Refer to Dr. Vicente Males  #Back pain, her oxycodone dose have been adjusted by Dr. Amalia Hailey at [facility] recently.  Currently she is on 10 mg every 6 hours as needed.  Reports back pain is better controlled.  She has an x-ray done at [facility] which did not show any significant acute findings. Her lumbar MRI has been scheduled.  #Bone metastasis, she has been on Xgeva treatments.  Her insurance no longer covers Xgeva and patient is switched to Zometa 4 mg monthly.  We will proceed at next visit.  Return of visit: 1 week to assess tolerability and evaluation prior to next dose.  Earlie Server, MD, PhD Hematology Oncology [facility] at [facility] Pager- [phone] 11/02/2018

## 2018-11-03 ENCOUNTER — Telehealth: Payer: Self-pay | Admitting: Gastroenterology

## 2018-11-03 ENCOUNTER — Encounter: Payer: Self-pay | Admitting: Gastroenterology

## 2018-11-03 ENCOUNTER — Ambulatory Visit: Payer: Medicare PPO | Admitting: Gastroenterology

## 2018-11-03 ENCOUNTER — Other Ambulatory Visit: Payer: Self-pay

## 2018-11-03 VITALS — BP 115/68 | HR 67 | Ht 62.0 in | Wt 145.0 lb

## 2018-11-03 DIAGNOSIS — R1310 Dysphagia, unspecified: Secondary | ICD-10-CM

## 2018-11-03 LAB — KAPPA/LAMBDA LIGHT CHAINS
Kappa free light chain: 3.4 mg/L (ref 3.3–19.4)
Kappa, lambda light chain ratio: 0 — ABNORMAL LOW (ref 0.26–1.65)
Lambda free light chains: 893.2 mg/L — ABNORMAL HIGH (ref 5.7–26.3)

## 2018-11-03 MED ORDER — SUCRALFATE 1 GM/10ML PO SUSP: 1.0000 g | Freq: Four times a day (QID) | ORAL | 1 refills | Status: DC

## 2018-11-03 MED ORDER — SUCRALFATE 1 G PO TABS: 1.0000 g | ORAL_TABLET | Freq: Three times a day (TID) | ORAL | 2 refills | Status: AC

## 2018-11-03 NOTE — Telephone Encounter (Signed)
Cassie Jones from [place] left vm regarding pt rx carefade suspension is not covered under insurance but the tablets are please call her at [phone]

## 2018-11-03 NOTE — Progress Notes (Signed)
Jonathon Bellows MD, MRCP(U.K) [address]  [place], [place] [address]  Main: [phone]  Fax: [phone]   Gastroenterology Consultation  Referring Provider:     McLean-Scocuzza, Olivia Mackie * Primary Care Physician:  McLean-Scocuzza, Nino Glow, MD Primary Gastroenterologist:  Dr. Jonathon Bellows  Reason for Consultation:     Dyspgia         HPI:   Cassie Jones is a 73 y.o. y/o female referred for consultation & management  by Dr. Terese Door, Nino Glow, MD.    She has been referred for GERD/Dysphagia. She follows with Dr Tasia Catchings for Myeloma. On chemotherapy last two years.    Dysphagia: Onset and any progression: says the food goes down and stops in the upper part of the abdomen, began last Thursday . Associated with pain in the back, able to eat . When she drinks a coke , causes severe pain . Does take protonix for GERD. Takes it first thing in the AM on an empty stomach. She is on Eloquis.   H/o colectomy , S/p colostomy and reversal for colon perforation- was performed at [facility]   Says she is not interested for a colonoscopy .   Past Medical History:  Diagnosis Date  . Ascites   . Asthma   . Bilateral leg edema   . Cancer ([place])   . Chicken pox   . Colon polyps   . Diverticulitis    with perforation 02/2017 and hosp with colostomy bag s/p removal   . Drug-induced neutropenia ([place]) 03/09/2018  . GERD (gastroesophageal reflux disease)   . Hyperlipidemia   . Hypertension   . Multiple myeloma ([place])    dx'ed 01/2018 follows Dr. Tasia Catchings and [facility] H/O   . Pleural effusion   . Pneumonia    03/02/18   . Thyroid nodule   . UTI (urinary tract infection)   . Vitamin D deficiency     Past Surgical History:  Procedure Laterality Date  . ABDOMINAL HYSTERECTOMY    . BREAST BIOPSY Bilateral yrs ago   benign  . CHOLECYSTECTOMY    . COLOSTOMY  2018  . COLOSTOMY REVERSAL     11 2018  . KYPHOPLASTY     11/2017 [place]     Prior to Admission medications     Medication Sig Start Date End Date Taking? Authorizing Provider  acetaminophen (TYLENOL) 500 MG tablet Take 1,000 mg by mouth every 6 (six) hours as needed for mild pain or fever.   Yes [provider]  acyclovir (ZOVIRAX) 400 MG tablet Take 1 tablet (400 mg total) by mouth daily. 10/28/18  Yes Earlie Server, MD  albuterol (VENTOLIN HFA) 108 (90 Base) MCG/ACT inhaler Inhale 2 puffs into the lungs every 6 (six) hours as needed. 01/15/18  Yes Verlon Au, NP  apixaban (ELIQUIS) 5 MG TABS tablet Take 1 tablet (5 mg total) by mouth 2 (two) times daily. 04/06/18  Yes Earlie Server, MD  benzonatate (TESSALON) 100 MG capsule Take 100 mg by mouth 3 (three) times daily as needed.  10/19/18 11/17/18 Yes [provider]  calcium-vitamin D (OSCAL WITH D) 500-200 MG-UNIT tablet Take 1 tablet by mouth daily. 05/18/18  Yes Earlie Server, MD  Cholecalciferol 50000 units capsule Take 1 capsule (50,000 Units total) by mouth once a week. 03/04/18  Yes McLean-Scocuzza, Nino Glow, MD  dexamethasone (DECADRON) 4 MG tablet Take 10 tablets ('40mg'$ ) on days 1,8,15, and 22 of cycles 1-9. Then take on days 1,8,15 of cycles 10  and beyond. 10/28/18  Yes Earlie Server, MD  feeding supplement, ENSURE ENLIVE, (ENSURE ENLIVE) LIQD Take 237 mLs by mouth 3 (three) times daily between meals. 03/05/18  Yes Fritzi Mandes, MD  fluticasone (FLONASE) 50 MCG/ACT nasal spray Place 1-2 sprays into both nostrils daily. Patient taking differently: Place 1-2 sprays into both nostrils daily as needed.  03/02/18  Yes McLean-Scocuzza, Nino Glow, MD  fluticasone (FLOVENT DISKUS) 50 MCG/BLIST diskus inhaler Inhale 1 puff into the lungs 2 (two) times daily. Patient taking differently: Inhale 1 puff into the lungs 2 (two) times daily as needed.  01/22/18  Yes Earlie Server, MD  furosemide (LASIX) 20 MG tablet Take 1 tablet (20 mg total) by mouth daily as needed. 01/28/18  Yes McLean-Scocuzza, Nino Glow, MD  loperamide (IMODIUM A-D) 2 MG tablet Take 2 mg by mouth 4 (four) times  daily as needed for diarrhea or loose stools.   Yes [provider]  montelukast (SINGULAIR) 10 MG tablet Take 10 mg by mouth at bedtime.   Yes [provider]  ondansetron (ZOFRAN) 8 MG tablet Take 1 tablet (8 mg total) by mouth 2 (two) times daily as needed (Nausea or vomiting). 10/28/18  Yes Earlie Server, MD  oxyCODONE (OXY IR/ROXICODONE) 5 MG immediate release tablet Take 1 tablet (5 mg total) by mouth every 8 (eight) hours as needed for moderate pain. Patient taking differently: Take 5-10 mg by mouth every 6 (six) hours as needed for moderate pain.  06/29/18  Yes Earlie Server, MD  pantoprazole (PROTONIX) 40 MG tablet Take 1 tablet (40 mg total) by mouth every morning. 30 minutes before food 06/29/18  Yes McLean-Scocuzza, Nino Glow, MD  potassium chloride SA (K-DUR,KLOR-CON) 20 MEQ tablet Take 1 tablet (20 mEq total) by mouth daily. 07/15/18  Yes Jacquelin Hawking, NP  pravastatin (PRAVACHOL) 10 MG tablet Take 1 tablet by mouth at bedtime. 12/11/16  Yes [provider]  prochlorperazine (COMPAZINE) 10 MG tablet Take 1 tablet (10 mg total) by mouth every 6 (six) hours as needed (Nausea or vomiting). 10/28/18  Yes Earlie Server, MD  spironolactone (ALDACTONE) 25 MG tablet Take 25 mg by mouth daily.   Yes [provider]  tiotropium (SPIRIVA) 18 MCG inhalation capsule Place 1 capsule (18 mcg total) into inhaler and inhale daily. 05/31/18  Yes Laverle Hobby, MD    Family History  Problem Relation Age of Onset  . Breast cancer Mother 36  . Cancer Mother        breast   . Diabetes Mother   . Heart disease Mother   . Hyperlipidemia Mother   . Hypertension Mother   . Birth defects Brother   . Diabetes Brother   . Heart disease Brother   . Hyperlipidemia Brother   . Cancer Father        prostate met to liver      Social History   Tobacco Use  . Smoking status: Former Research scientist (life sciences)  . Smokeless tobacco: Never Used  Substance Use Topics  . Alcohol use: Not Currently  . Drug  use: Never    Allergies as of 11/03/2018 - Review Complete 11/03/2018  Allergen Reaction Noted  . Codeine Nausea And Vomiting 03/19/2014    Review of Systems:    All systems reviewed and negative except where noted in HPI.   Physical Exam:  BP 115/68   Pulse 67   Ht '5\' 2"'$  (1.575 m)   Wt 145 lb (65.8 kg)   BMI 26.52 kg/m  No  LMP recorded. Patient is postmenopausal. Psych:  Alert and cooperative. Normal mood and affect. General:   Alert,  Well-developed, well-nourished, pleasant and cooperative in NAD Head:  Normocephalic and atraumatic. Eyes:  Sclera clear, no icterus.   Conjunctiva pink. Ears:  Normal auditory acuity. Nose:  No deformity, discharge, or lesions. Mouth:  No deformity or lesions,oropharynx pink & moist. Neck:  Supple; no masses or thyromegaly. Lungs:  Respirations even and unlabored.  Clear throughout to auscultation.   No wheezes, crackles, or rhonchi. No acute distress. Heart:  Regular rate and rhythm; no murmurs, clicks, rubs, or gallops. Abdomen:  Normal bowel sounds.  No bruits.  Soft, non-tender and non-distended without masses, hepatosplenomegaly or hernias noted.  No guarding or rebound tenderness.    Neurologic:  Alert and oriented x3;  grossly normal neurologically. Skin:  Intact without significant lesions or rashes. No jaundice. Lymph Nodes:  No significant cervical adenopathy. Psych:  Alert and cooperative. Normal mood and affect.  Imaging Studies: Dg Chest 2 View  Result Date: 10/21/2018 CLINICAL DATA:  Worsening shortness of breath since hospital discharge 2 days ago. EXAM: CHEST - 2 VIEW COMPARISON:  Chest x-ray dated Mar 02, 2018. FINDINGS: The heart size and mediastinal contours are within normal limits. Normal pulmonary vascularity. Atherosclerotic calcification of the aortic arch. Biapical pleuroparenchymal scarring. No focal consolidation, pleural effusion, or pneumothorax. No acute osseous abnormality. IMPRESSION: No active cardiopulmonary  disease. Electronically Signed   By: Titus Dubin M.D.   On: 10/21/2018 14:54    Assessment and Plan:   Krystan Northrop is a 73 y.o. y/o female has been referred for  GERD and dysphagia. She has odynophagia.  R/o esophagitis  Plan  1. GERD : continue protonix   2. EGD with Eloquis holding instructions.   3. Sucralfate PRN  I have discussed alternative options, risks & benefits,  which include, but are not limited to, bleeding, infection, perforation,respiratory complication & drug reaction.  The patient agrees with this plan & written consent will be obtained.     Follow up in 2 weeks   Dr Jonathon Bellows MD,MRCP(U.K)

## 2018-11-03 NOTE — Telephone Encounter (Signed)
Patient called & needs prescription resent to [facility] for sucralfate (CARAFATE) 1 GM/10ML suspension in pill for. The liquid form will be over $200 dollars & the pill a $1. She can put the pill in a medicine cup with water & per the pharmacy this would be the same as the liquid. She uses [facility] on [address]. Please call today if possible she is going out of town.(why marked as high priority)

## 2018-11-03 NOTE — Telephone Encounter (Signed)
Pt prescription has been changed to pill form and sent to preferred pharmacy.

## 2018-11-03 NOTE — Addendum Note (Signed)
Addended by: Dorethea Clan on: 11/03/2018 03:22 PM   Modules accepted: Orders

## 2018-11-04 ENCOUNTER — Telehealth: Payer: Self-pay | Admitting: *Deleted

## 2018-11-04 ENCOUNTER — Telehealth: Payer: Self-pay

## 2018-11-04 LAB — MULTIPLE MYELOMA PANEL, SERUM
ALPHA2 GLOB SERPL ELPH-MCNC: 1.1 g/dL — AB (ref 0.4–1.0)
Albumin SerPl Elph-Mcnc: 3 g/dL (ref 2.9–4.4)
Albumin/Glob SerPl: 0.7 (ref 0.7–1.7)
Alpha 1: 0.3 g/dL (ref 0.0–0.4)
B-Globulin SerPl Elph-Mcnc: 0.9 g/dL (ref 0.7–1.3)
Gamma Glob SerPl Elph-Mcnc: 2.1 g/dL — ABNORMAL HIGH (ref 0.4–1.8)
Globulin, Total: 4.3 g/dL — ABNORMAL HIGH (ref 2.2–3.9)
IgA: <5 mg/dL — ABNORMAL LOW (ref 64–422)
IgG (Immunoglobin G), Serum: 2893 mg/dL — ABNORMAL HIGH (ref 700–1600)
IgM (Immunoglobulin M), Srm: 8 mg/dL — ABNORMAL LOW (ref 26–217)
M Protein SerPl Elph-Mcnc: 1.9 g/dL — ABNORMAL HIGH
Total Protein ELP: 7.3 g/dL (ref 6.0–8.5)

## 2018-11-04 NOTE — Telephone Encounter (Signed)
Per Dr Tasia Catchings notified pt to stop taking Eliquis since she is no longer taking Pomalyst.  Patient verbalized understanding

## 2018-11-04 NOTE — Addendum Note (Signed)
Addended by: Dorethea Clan on: 11/04/2018 03:39 PM   Modules accepted: Orders, SmartSet

## 2018-11-04 NOTE — Telephone Encounter (Signed)
Spoke with pt and informed her that we have received the blood thinner holding instructions from Dr. Tasia Catchings. Dr. Collie Siad office also contacted pt and gave instructions to stop the Eliquis. We have now scheduled pt endoscopy procedure.

## 2018-11-09 ENCOUNTER — Inpatient Hospital Stay (HOSPITAL_BASED_OUTPATIENT_CLINIC_OR_DEPARTMENT_OTHER): Payer: Medicare PPO | Admitting: Oncology

## 2018-11-09 ENCOUNTER — Ambulatory Visit: Payer: Self-pay | Admitting: Urology

## 2018-11-09 ENCOUNTER — Encounter: Payer: Self-pay | Admitting: Oncology

## 2018-11-09 ENCOUNTER — Inpatient Hospital Stay: Payer: Medicare PPO

## 2018-11-09 ENCOUNTER — Other Ambulatory Visit: Payer: Self-pay

## 2018-11-09 VITALS — BP 139/80 | HR 66 | Temp 96.7°F | Resp 18 | Wt 146.1 lb

## 2018-11-09 DIAGNOSIS — Z86718 Personal history of other venous thrombosis and embolism: Secondary | ICD-10-CM

## 2018-11-09 DIAGNOSIS — C7951 Secondary malignant neoplasm of bone: Secondary | ICD-10-CM | POA: Diagnosis not present

## 2018-11-09 DIAGNOSIS — E876 Hypokalemia: Secondary | ICD-10-CM

## 2018-11-09 DIAGNOSIS — D649 Anemia, unspecified: Secondary | ICD-10-CM | POA: Diagnosis not present

## 2018-11-09 DIAGNOSIS — C9002 Multiple myeloma in relapse: Secondary | ICD-10-CM | POA: Diagnosis not present

## 2018-11-09 DIAGNOSIS — E785 Hyperlipidemia, unspecified: Secondary | ICD-10-CM

## 2018-11-09 DIAGNOSIS — Z8701 Personal history of pneumonia (recurrent): Secondary | ICD-10-CM

## 2018-11-09 DIAGNOSIS — J449 Chronic obstructive pulmonary disease, unspecified: Secondary | ICD-10-CM

## 2018-11-09 DIAGNOSIS — I10 Essential (primary) hypertension: Secondary | ICD-10-CM

## 2018-11-09 DIAGNOSIS — Z5111 Encounter for antineoplastic chemotherapy: Secondary | ICD-10-CM

## 2018-11-09 DIAGNOSIS — Z87891 Personal history of nicotine dependence: Secondary | ICD-10-CM

## 2018-11-09 DIAGNOSIS — D702 Other drug-induced agranulocytosis: Secondary | ICD-10-CM

## 2018-11-09 DIAGNOSIS — K219 Gastro-esophageal reflux disease without esophagitis: Secondary | ICD-10-CM

## 2018-11-09 DIAGNOSIS — Z7901 Long term (current) use of anticoagulants: Secondary | ICD-10-CM

## 2018-11-09 DIAGNOSIS — R1319 Other dysphagia: Secondary | ICD-10-CM

## 2018-11-09 DIAGNOSIS — M549 Dorsalgia, unspecified: Secondary | ICD-10-CM

## 2018-11-09 DIAGNOSIS — Z79899 Other long term (current) drug therapy: Secondary | ICD-10-CM

## 2018-11-09 LAB — CBC WITH DIFFERENTIAL/PLATELET
Abs Immature Granulocytes: 0.06 10*3/uL (ref 0.00–0.07)
Basophils Absolute: 0 10*3/uL (ref 0.0–0.1)
Basophils Relative: 0 %
Eosinophils Absolute: 0.1 10*3/uL (ref 0.0–0.5)
Eosinophils Relative: 2 %
HCT: 33.8 % — ABNORMAL LOW (ref 36.0–46.0)
Hemoglobin: 10.6 g/dL — ABNORMAL LOW (ref 12.0–15.0)
Immature Granulocytes: 1 %
Lymphocytes Relative: 25 %
Lymphs Abs: 1.3 10*3/uL (ref 0.7–4.0)
MCH: 29.1 pg (ref 26.0–34.0)
MCHC: 31.4 g/dL (ref 30.0–36.0)
MCV: 92.9 fL (ref 80.0–100.0)
Monocytes Absolute: 0.7 10*3/uL (ref 0.1–1.0)
Monocytes Relative: 13 %
Neutro Abs: 3.2 10*3/uL (ref 1.7–7.7)
Neutrophils Relative %: 59 %
Platelets: 194 10*3/uL (ref 150–400)
RBC: 3.64 MIL/uL — ABNORMAL LOW (ref 3.87–5.11)
RDW: 15 % (ref 11.5–15.5)
WBC: 5.4 10*3/uL (ref 4.0–10.5)
nRBC: 0 % (ref 0.0–0.2)

## 2018-11-09 LAB — COMPREHENSIVE METABOLIC PANEL WITH GFR
ALT: 25 U/L (ref 0–44)
AST: 24 U/L (ref 15–41)
Albumin: 2.9 g/dL — ABNORMAL LOW (ref 3.5–5.0)
Alkaline Phosphatase: 44 U/L (ref 38–126)
Anion gap: 5 (ref 5–15)
BUN: 10 mg/dL (ref 8–23)
CO2: 26 mmol/L (ref 22–32)
Calcium: 8.9 mg/dL (ref 8.9–10.3)
Chloride: 107 mmol/L (ref 98–111)
Creatinine, Ser: 0.79 mg/dL (ref 0.44–1.00)
GFR calc Af Amer: >60 mL/min (ref >60)
GFR calc non Af Amer: >60 mL/min (ref >60)
Glucose, Bld: 121 mg/dL — ABNORMAL HIGH (ref 70–99)
Potassium: 3.1 mmol/L — ABNORMAL LOW (ref 3.5–5.1)
Sodium: 138 mmol/L (ref 135–145)
Total Bilirubin: 1 mg/dL (ref 0.3–1.2)
Total Protein: 7.7 g/dL (ref 6.5–8.1)

## 2018-11-09 MED ORDER — SODIUM CHLORIDE 0.9 % IV SOLN
Freq: Once | INTRAVENOUS | Status: AC
  Administered 2018-11-09: 12:00:00 via INTRAVENOUS
  Filled 2018-11-09: qty 250

## 2018-11-09 MED ORDER — SODIUM CHLORIDE 0.9 % IV SOLN
Freq: Once | INTRAVENOUS | Status: AC
  Administered 2018-11-09: 10:00:00 via INTRAVENOUS
  Filled 2018-11-09: qty 250

## 2018-11-09 MED ORDER — DEXTROSE 5 % IV SOLN
120.0000 mg | Freq: Once | INTRAVENOUS | Status: AC
  Administered 2018-11-09: 120 mg via INTRAVENOUS
  Filled 2018-11-09: qty 60

## 2018-11-09 MED ORDER — POTASSIUM CHLORIDE 20 MEQ/100ML IV SOLN: 20.0000 meq | Freq: Once | INTRAVENOUS | Status: AC

## 2018-11-09 MED ORDER — POTASSIUM CHLORIDE 20 MEQ/100ML IV SOLN: 20.0000 meq | Freq: Once | INTRAVENOUS | Status: DC

## 2018-11-09 MED ORDER — ALPRAZOLAM 0.5 MG PO TABS: 0.5000 mg | ORAL_TABLET | Freq: Two times a day (BID) | ORAL | 0 refills | Status: AC | PRN

## 2018-11-09 MED ORDER — ZOLEDRONIC ACID 4 MG/100ML IV SOLN
4.0000 mg | Freq: Once | INTRAVENOUS | Status: AC
  Administered 2018-11-09: 4 mg via INTRAVENOUS
  Filled 2018-11-09: qty 100

## 2018-11-09 MED ORDER — PROCHLORPERAZINE MALEATE 10 MG PO TABS
10.0000 mg | ORAL_TABLET | Freq: Once | ORAL | Status: AC
  Administered 2018-11-09: 10 mg via ORAL
  Filled 2018-11-09: qty 1

## 2018-11-09 NOTE — Progress Notes (Signed)
Hematology/Oncology Follow up note [facility] [phone] [phone]) [phone]   Patient Care Team: McLean-Scocuzza, Nino Glow, MD as PCP - General (Internal Medicine)  REFERRING PROVIDER: Dr. Baruch Gouty, Donette  REASON FOR VISIT Follow up for treatment of multiple myeloma.   HISTORY OF PRESENTING ILLNESS:  Atley Scarboro is a  73 y.o.  female with PMH listed below who was referred to me for evaluation of multiple myeloma.  Patient used to follow-up with local oncologist Dr. Tonia Brooms and [facility] oncologist Dr. Amalia Hailey.  Patient tells me that as her husband works every day and has a busy working schedule, she is not able to get transportation to her treatment.  As an alternative she is now living with her brother who is close to [facility] [place] and want to transfer care here. Extensive medical record review was performed.    Patient was diagnosed with IgG lambda, ISS2, t (4,14) multiple myeloma on February 03, 2017, she has hemoglobin of 9, calcium 8.9, LDH 132, beta microglobulin 4.6, albumin 3.2.  Skeletal survey negative.  PET scan with single nonspecific humeral lesion and a thyroid nodule.  Marrow with 60% plasmacytosis by CD138 IHC, FISH with hyperdiploidy t(4, 14), 1 q. gain.. Per note, she was treated with CyborD (M spike 3.6 at start)  from 01/2017 to 02/2017. Her treatment was switched to Revlimid Velcade dexamethasone from 02/2017, M spike 1.2 at start,and was found to have progression of disease in 09/2017, when her M spike increased to 1.8, and developed compression fracture in 10/2016. She was restarted on RVD using weekly Velcade.  She was recently seen by Dr. Boris Sharper and recommended to start on a combination of daratumumab, Pomalyst, dexamethasone. Per patient she received her first daratumumab last week with split dose 54m/kg given on 3/14 and 3/15.  Patient reports that she tolerate the treatment well except mild infusion  reaction.  # Patient also reports that she has got dental clearance and has been given 1 dose of Zometa by Dr. VRemus Loffler(unkown dates).  [facility]  # Patient was seen by nurse practitioner last week prior to her cycle 2 daratumumab.  She reports feeling shortness of breath, having cough, believed to have bronchitis and was prescribed Levaquin 500 mg p.o. daily for 7 days, and albuterol as needed.  She also had a VQ scan low probability for pulmonary embolism.  # 01/29/2018 5th weekly treatment of Daratumumab,  Cycle 2 Pomalyst was started for a week and was nterrupted due to acute bronchitis treated with outpatient Azithromycin.    # 02/16/2018 developed right upper extremity swelling and tenderness, confirmed to be DVT. She was taking Aspirin.  Started on Eliquis. Aspirin is stopped.  # 02/16/2018 6th Daratumumab treatment. Was started on Cycle 3 Pomalyst, she has completed about 2 weeks treatment. She was admitted to hospital on 02/21/2018 for Klebsiella UTI and oncology was not consulted. She was treatment with antibiotics and continued on Pomalyst. She was hospitalized again on 03/02/2018 for pneumonia, COPD exacerbation/emphasema and oncology was consulted and patient was seen by my colleague Dr.Finnergan and advise patient to stop Pomalyst.  # [facility] recommends dose reduced Pomalyst 340m This was previously discussed with patient, and she was reluctant to be restarted on Pomalyst. Today she seems to be more acceptable for restarting Pomalyst, but requests to start in a few weeks so that she can have  a nice break.   #Previously declined bone marrow transplant evaluation.  # admitted to [facility] 08/22/2018 due to UTI.  Renal ultrasound did not show any kidney stone.  Urine culture showed pansensitive E. coli.  Patient was discharged on a course of oral ciprofloxacin.  # During the interval,  patient was admitted from 10/21/2020 to  10/23/2018 due to acute bronchitis. #Multiple myeloma, recent multiple myeloma panels confirmed that she has disease progression. I called and discussed with on-call physician Dr. Evelene Croon and a communicated about patient's multiple myeloma panel. She has followed up with Dr. Amalia Hailey at [facility] on 10/27/2018 and was recommended a few options including Carfilzomib plus dexamethasone,Carfilzomib  plus dexamethasone plus Selinexor as part of clinical trial. Patient open to do carfilzomib and dexamethasone.  INTERVAL HISTORY Etienne Millward is a 73 y.o. female who has above history reviewed by me presents for assessment prior to antineoplastic chemotherapy toxicity assessment.   #Patient is currently on third line multiple myeloma treatment carfilzomib plus dexamethasone.  She received first dose of carfilzomib last week. Tolerates well.   #Back pain, reports becoming more severe recently.  Limited ability to perform IADLs.  Takes oxycodone 10 mg every 6 hours as needed.  Usually she takes 1 to 2 tablets/day. X-ray obtained showed moderate spondylosis of the lumbar spine and the mild compression deformities at L2-L5.  Likely chronic. MRI lumbar spine was scheduled to be done tomorrow.   #History of right upper extremity DVT likely secondary to being on Pomalyst.  She was on Eliquis which was discontinued at last visit as she is no longer on Pomalyst.  #Today she complained sensation of food stuck in the esophagus, for the past few weeks.  She has establish care with gastroenterology.  Data reviewed:  Multiple myeloma labs  03/06/2018 SPEP showed VGPR with >90% reduction of her M protein. Normalized free light chain ratio. 05/04/2018 SPEP continued to show stable M protein, at 0.2g/L,  06/01/2018  SPEP showed M protein 0.2g/L light chain ratio 0.27 07/27/2018 SPEP showed M protien  0.4g/L light chain ration 0.05 08/30/2018 SPEP showed M protein 0.6g/L light chain  ration 0.01 09/28/2018 SPEP showed M protein 1.1g/L light chain ratio 0.01   Current Treatment  # 3/14, 3/15 at outside facility, she got split dose for first treatment of daratumumab. #  01/08/2018 Start on weekly daratumumab '16mg'$ /m2, dexamethasone '20mg'$ ,  S/p # Palliative RT to spine. 01/08/2018 started cycle 1 Pomalyst '4mg'$  Day 1-21 ogf 28 day cycle.  Pomalyst 4 mg was held after 3 cycles with multiple interruptions due to neutropenia and recurrent pneumonia. Pomalyst was restarted at a lower dose of 3 mg on 04/06/2018 [cycle 4] . This cycle of Pomalyst was  disrreupted for 4 days due to UTI. She was instructed to restart and was able to finish this cycle.   05/11/2018 Cycle 5 Pomalyst delayed one week as she needs to get deep dental clean before obtaining dental clearance for Xgeva.  06/01/2018  Xgeva.  06/08/2018 Cycle 6 Pomalyst '3mg'$   06/29/2018 Cycle 7 Pomalyst '3mg'$   08/03/2018 Cycle 8 Plmalyst '3mg'$    09/06/2018 Cycle 9 Pomalyst '3mg'$  [delayed due to UTI].  Daratumumab monthly   11/02/2017 Carfilzimab and Dexamethasone.   Current Pain medication: started to take oxycodone '10mg'$  Q6h PRN  Review of Systems  Constitutional: Negative for chills, fever, malaise/fatigue and weight loss.  HENT: Negative for sore throat.   Eyes: Negative for redness.  Respiratory: Negative for cough, shortness of breath and wheezing.  Cardiovascular: Negative for chest pain, palpitations and leg swelling.  Gastrointestinal: Negative for abdominal pain, blood in stool, nausea and vomiting.  Genitourinary: Negative for dysuria.  Musculoskeletal: Positive for back pain. Negative for myalgias.  Skin: Negative for rash.  Neurological: Negative for dizziness, tingling and tremors.  Endo/Heme/Allergies: Does not bruise/bleed easily.  Psychiatric/Behavioral: Negative for hallucinations.    MEDICAL HISTORY:  Past Medical History:  Diagnosis Date  . Ascites   . Asthma   . Bilateral leg edema   . Cancer ([place])   .  Chicken pox   . Colon polyps   . Diverticulitis    with perforation 02/2017 and hosp with colostomy bag s/p removal   . Drug-induced neutropenia ([place]) 03/09/2018  . GERD (gastroesophageal reflux disease)   . Hyperlipidemia   . Hypertension   . Multiple myeloma ([place])    dx'ed 01/2018 follows Dr. Tasia Catchings and [facility] H/O   . Pleural effusion   . Pneumonia    03/02/18   . Thyroid nodule   . UTI (urinary tract infection)   . Vitamin D deficiency     SURGICAL HISTORY: Past Surgical History:  Procedure Laterality Date  . ABDOMINAL HYSTERECTOMY    . BREAST BIOPSY Bilateral yrs ago   benign  . CHOLECYSTECTOMY    . COLOSTOMY  2018  . COLOSTOMY REVERSAL     11 2018  . KYPHOPLASTY     11/2017 [place]     SOCIAL HISTORY: Social History   Socioeconomic History  . Marital status: Married    Spouse name: Not on file  . Number of children: Not on file  . Years of education: Not on file  . Highest education level: Not on file  Occupational History  . Not on file  Social Needs  . Financial resource strain: Not hard at all  . Food insecurity:    Worry: Never true    Inability: Never true  . Transportation needs:    Medical: No    Non-medical: No  Tobacco Use  . Smoking status: Former Research scientist (life sciences)  . Smokeless tobacco: Never Used  Substance and Sexual Activity  . Alcohol use: Not Currently  . Drug use: Never  . Sexual activity: Yes    Comment: husband   Lifestyle  . Physical activity:    Days per week: 0 days    Minutes per session: 0 min  . Stress: Not at all  Relationships  . Social connections:    Talks on phone: More than three times a week    Gets together: Once a week    Attends religious service: 1 to 4 times per year    Active member of club or organization: No    Attends meetings of clubs or organizations: Never    Relationship status: Married  . Intimate partner violence:    Fear of current or ex partner: No    Emotionally abused: No    Physically abused: No     Forced sexual activity: No  Other Topics Concern  . Not on file  Social History Narrative   Married    Husband lives in [place] while she gets treatment in this area    She lives with sister in law when receiving treatment    FAMILY HISTORY: Family History  Problem Relation Age of Onset  . Breast cancer Mother 67  . Cancer Mother        breast   . Diabetes Mother   . Heart disease Mother   .  Hyperlipidemia Mother   . Hypertension Mother   . Birth defects Brother   . Diabetes Brother   . Heart disease Brother   . Hyperlipidemia Brother   . Cancer Father        prostate met to liver     ALLERGIES:  is allergic to codeine.  MEDICATIONS:  Current Outpatient Medications  Medication Sig Dispense Refill  . acetaminophen (TYLENOL) 500 MG tablet Take 1,000 mg by mouth every 6 (six) hours as needed for mild pain or fever.    Marland Kitchen acyclovir (ZOVIRAX) 400 MG tablet Take 1 tablet (400 mg total) by mouth daily. 30 tablet 3  . albuterol (VENTOLIN HFA) 108 (90 Base) MCG/ACT inhaler Inhale 2 puffs into the lungs every 6 (six) hours as needed. 1 Inhaler 1  . benzonatate (TESSALON) 100 MG capsule Take 100 mg by mouth 3 (three) times daily as needed.     . calcium-vitamin D (OSCAL WITH D) 500-200 MG-UNIT tablet Take 1 tablet by mouth daily. 30 tablet 0  . Cholecalciferol 50000 units capsule Take 1 capsule (50,000 Units total) by mouth once a week. 13 capsule 1  . dexamethasone (DECADRON) 4 MG tablet Take 10 tablets ('40mg'$ ) on days 1,8,15, and 22 of cycles 1-9. Then take on days 1,8,15 of cycles 10 and beyond. 40 tablet 4  . feeding supplement, ENSURE ENLIVE, (ENSURE ENLIVE) LIQD Take 237 mLs by mouth 3 (three) times daily between meals. 237 mL 12  . fluticasone (FLONASE) 50 MCG/ACT nasal spray Place 1-2 sprays into both nostrils daily. (Patient taking differently: Place 1-2 sprays into both nostrils daily as needed. ) 16 g 2  . fluticasone (FLOVENT DISKUS) 50 MCG/BLIST diskus inhaler Inhale 1  puff into the lungs 2 (two) times daily. (Patient taking differently: Inhale 1 puff into the lungs 2 (two) times daily as needed. ) 1 Inhaler 3  . furosemide (LASIX) 20 MG tablet Take 1 tablet (20 mg total) by mouth daily as needed. 30 tablet 2  . loperamide (IMODIUM A-D) 2 MG tablet Take 2 mg by mouth 4 (four) times daily as needed for diarrhea or loose stools.    . montelukast (SINGULAIR) 10 MG tablet Take 10 mg by mouth at bedtime.    . ondansetron (ZOFRAN) 8 MG tablet Take 1 tablet (8 mg total) by mouth 2 (two) times daily as needed (Nausea or vomiting). 30 tablet 1  . oxyCODONE (OXY IR/ROXICODONE) 5 MG immediate release tablet Take 1 tablet (5 mg total) by mouth every 8 (eight) hours as needed for moderate pain. (Patient taking differently: Take 5-10 mg by mouth every 6 (six) hours as needed for moderate pain. ) 30 tablet 0  . pantoprazole (PROTONIX) 40 MG tablet Take 1 tablet (40 mg total) by mouth every morning. 30 minutes before food 90 tablet 1  . potassium chloride SA (K-DUR,KLOR-CON) 20 MEQ tablet Take 1 tablet (20 mEq total) by mouth daily. 30 tablet 2  . pravastatin (PRAVACHOL) 10 MG tablet Take 1 tablet by mouth at bedtime.    . prochlorperazine (COMPAZINE) 10 MG tablet Take 1 tablet (10 mg total) by mouth every 6 (six) hours as needed (Nausea or vomiting). 30 tablet 1  . spironolactone (ALDACTONE) 25 MG tablet Take 25 mg by mouth daily.    . sucralfate (CARAFATE) 1 g tablet Take 1 tablet (1 g total) by mouth 4 (four) times daily -  with meals and at bedtime. 120 tablet 2  . tiotropium (SPIRIVA) 18 MCG inhalation capsule Place 1  capsule (18 mcg total) into inhaler and inhale daily. 30 capsule 6  . ALPRAZolam (XANAX) 0.5 MG tablet Take 1 tablet (0.5 mg total) by mouth 2 (two) times daily as needed for anxiety. 2 tablet 0  . apixaban (ELIQUIS) 5 MG TABS tablet Take 1 tablet (5 mg total) by mouth 2 (two) times daily. (Patient not taking: Reported on 11/09/2018) 60 tablet 3   No current  facility-administered medications for this visit.    Facility-Administered Medications Ordered in Other Visits  Medication Dose Route Frequency Provider Last Rate Last Dose  . potassium chloride 20 mEq in 100 mL IVPB  20 mEq Intravenous Once Earlie Server, MD         PHYSICAL EXAMINATION: ECOG PERFORMANCE STATUS: 1 - Symptomatic but completely ambulatory Vitals:   11/09/18 0901  BP: 139/80  Pulse: 66  Resp: 18  Temp: (!) 96.7 F (35.9 C)   Filed Weights   11/09/18 0901  Weight: 146 lb 1.6 oz (66.3 kg)    Physical Exam  Constitutional: She is oriented to person, place, and time. No distress.  HENT:  Head: Normocephalic and atraumatic.  Nose: Nose normal.  Mouth/Throat: Oropharynx is clear and moist. No oropharyngeal exudate.  Eyes: Pupils are equal, round, and reactive to light. EOM are normal. No scleral icterus.  Neck: Normal range of motion. Neck supple.  Cardiovascular: Normal rate and regular rhythm.  No murmur heard. Pulmonary/Chest: Effort normal. No respiratory distress. She has no rales. She exhibits no tenderness.  Abdominal: Soft. She exhibits no distension. There is no abdominal tenderness.  Musculoskeletal: Normal range of motion.        General: No edema.  Neurological: She is alert and oriented to person, place, and time. No cranial nerve deficit. She exhibits normal muscle tone. Coordination normal.  Skin: Skin is warm and dry. She is not diaphoretic. No erythema.  Psychiatric: Affect normal.     LABORATORY DATA:  I have reviewed the data as listed Lab Results  Component Value Date   WBC 5.4 11/09/2018   HGB 10.6 (L) 11/09/2018   HCT 33.8 (L) 11/09/2018   MCV 92.9 11/09/2018   PLT 194 11/09/2018   Recent Labs    09/28/18 0911  10/22/18 0646 11/02/18 0905 11/09/18 0843  NA 139   < > 140 138 138  K 3.4*   < > 4.4 4.1 3.1*  CL 107   < > 111 104 107  CO2 26   < > '23 25 26  '$ GLUCOSE 96   < > 147* 97 121*  BUN 13   < > '16 16 10  '$ CREATININE 0.80   <  > 0.75 0.92 0.79  CALCIUM 9.4   < > 8.8* 9.7 8.9  GFRNONAA >60   < > >60 >60 >60  GFRAA >60   < > >60 >60 >60  PROT 6.8  --   --  7.8 7.7  ALBUMIN 3.3*  --   --  2.9* 2.9*  AST 13*  --   --  17 24  ALT 17  --   --  19 25  ALKPHOS 42  --   --  48 44  BILITOT 0.9  --   --  0.7 1.0   < > = values in this interval not displayed.   RADIOGRAPHIC STUDIES: I have personally reviewed the radiological images as listed and agreed with the findings in the report. 08/23/2018  US KIDNEY Unremarkable ultrasound examination of the kidneys, bladder and retroperitoneum  07/09/2018 BONE SCAN No focal radiotracer accumulation within the axillary or appendicular skeleton to localize multiple myeloma. Mild scoliosis of the lumbar spine. IMPRESSION: No evidence of multiple myeloma.   ASSESSMENT & PLAN:  1. Multiple myeloma in relapse ([place])   2. Bone metastasis ([place])   3. Encounter for antineoplastic chemotherapy   4. Hypokalemia    #Multiple myeloma:  IgG Lamda Tolerate cycle 1 day 1 carfilzomib and dexamethasone.  No concerns of side effects. Labs are reviewed and discussed with patient.  Proceed with day 8 carfilzomib and dexamethasone today. She has also establish care with her local oncologist and has an appointment on November 24, 2018. Advised patient to obtain labs and proceed with day 15 of carfilzomib and dexamethasone on November 16, 2018 And then she will have 1 week off and she will establish care with her local oncologist.  #Back pain continue oxycodone 10 mg every 6 hours as needed.  MRI lumbar spine scheduled to be done tomorrow. #Dysphagia, esophageal phase.  Chronic history of GERD on PPI.  She has establish care with gastroenterology for further evaluation.  #Bone metastasis, she has been on Xgeva treatments.  Her insurance no longer covers Xgeva and patient is switched to Zometa 4 mg monthly.  Proceed with Zometa today.  #Hypokalemia, potassium 3.1.  I plan to give her 1 dose of IV  potassium however patient reports burning of her peripheral IV and cannot tolerate.  Advised patient to start taking potassium chloride 20 mEq daily.  She will have repeat labs next week.  Return of visit: Patient is establishing care with her local oncologist.  Would not schedule follow-up appointment at this point.  Earlie Server, MD, PhD Hematology Oncology [facility] at [facility] Pager- [phone] 11/09/2018

## 2018-11-09 NOTE — Progress Notes (Signed)
Patient here for follow up. Pt states that every time she eats, she has loose stools about 1 hour after she eats.

## 2018-11-10 ENCOUNTER — Encounter: Payer: Self-pay | Admitting: Radiology

## 2018-11-10 ENCOUNTER — Ambulatory Visit
Admission: RE | Admit: 2018-11-10 | Discharge: 2018-11-10 | Disposition: A | Discharge status: Home / Self Care | Payer: Medicare PPO | Source: Ambulatory Visit | Attending: Oncology | Admitting: Oncology

## 2018-11-10 DIAGNOSIS — C9002 Multiple myeloma in relapse: Secondary | ICD-10-CM | POA: Diagnosis present

## 2018-11-10 MED ORDER — GADOBUTROL 1 MMOL/ML IV SOLN
6.0000 mL | Freq: Once | INTRAVENOUS | Status: AC | PRN
  Administered 2018-11-10: 6 mL via INTRAVENOUS

## 2018-11-11 ENCOUNTER — Ambulatory Visit: Payer: Medicare PPO | Admitting: Anesthesiology

## 2018-11-11 ENCOUNTER — Encounter
Admission: RE | Disposition: A | Discharge status: Home / Self Care | Payer: Self-pay | Source: Home / Self Care | Attending: Gastroenterology

## 2018-11-11 ENCOUNTER — Ambulatory Visit
Admission: RE | Admit: 2018-11-11 | Discharge: 2018-11-11 | Disposition: A | Discharge status: Home / Self Care | Payer: Medicare PPO | Attending: Gastroenterology | Admitting: Gastroenterology

## 2018-11-11 ENCOUNTER — Encounter: Payer: Self-pay | Admitting: Anesthesiology

## 2018-11-11 DIAGNOSIS — K219 Gastro-esophageal reflux disease without esophagitis: Secondary | ICD-10-CM | POA: Insufficient documentation

## 2018-11-11 DIAGNOSIS — R1310 Dysphagia, unspecified: Secondary | ICD-10-CM | POA: Diagnosis present

## 2018-11-11 DIAGNOSIS — E559 Vitamin D deficiency, unspecified: Secondary | ICD-10-CM | POA: Diagnosis not present

## 2018-11-11 DIAGNOSIS — Z79899 Other long term (current) drug therapy: Secondary | ICD-10-CM | POA: Insufficient documentation

## 2018-11-11 DIAGNOSIS — K222 Esophageal obstruction: Secondary | ICD-10-CM | POA: Diagnosis not present

## 2018-11-11 DIAGNOSIS — B3781 Candidal esophagitis: Secondary | ICD-10-CM | POA: Diagnosis not present

## 2018-11-11 DIAGNOSIS — E785 Hyperlipidemia, unspecified: Secondary | ICD-10-CM | POA: Insufficient documentation

## 2018-11-11 DIAGNOSIS — J45909 Unspecified asthma, uncomplicated: Secondary | ICD-10-CM | POA: Insufficient documentation

## 2018-11-11 DIAGNOSIS — K449 Diaphragmatic hernia without obstruction or gangrene: Secondary | ICD-10-CM | POA: Diagnosis not present

## 2018-11-11 DIAGNOSIS — I10 Essential (primary) hypertension: Secondary | ICD-10-CM | POA: Diagnosis not present

## 2018-11-11 DIAGNOSIS — Z87891 Personal history of nicotine dependence: Secondary | ICD-10-CM | POA: Diagnosis not present

## 2018-11-11 HISTORY — PX: ESOPHAGOGASTRODUODENOSCOPY (EGD) WITH PROPOFOL: SHX5813

## 2018-11-11 LAB — KOH PREP

## 2018-11-11 SURGERY — ESOPHAGOGASTRODUODENOSCOPY (EGD) WITH PROPOFOL
Anesthesia: General

## 2018-11-11 MED ORDER — LIDOCAINE HCL (CARDIAC) PF 100 MG/5ML IV SOSY
PREFILLED_SYRINGE | INTRAVENOUS | Status: DC | PRN
  Administered 2018-11-11: 30 mg via INTRAVENOUS

## 2018-11-11 MED ORDER — MIDAZOLAM HCL 2 MG/2ML IJ SOLN
INTRAMUSCULAR | Status: AC
  Filled 2018-11-11: qty 2

## 2018-11-11 MED ORDER — FENTANYL CITRATE (PF) 100 MCG/2ML IJ SOLN
INTRAMUSCULAR | Status: AC
  Filled 2018-11-11: qty 2

## 2018-11-11 MED ORDER — BUTAMBEN-TETRACAINE-BENZOCAINE 2-2-14 % EX AERO
INHALATION_SPRAY | CUTANEOUS | Status: AC
  Filled 2018-11-11: qty 5

## 2018-11-11 MED ORDER — FENTANYL CITRATE (PF) 100 MCG/2ML IJ SOLN
INTRAMUSCULAR | Status: DC | PRN
  Administered 2018-11-11 (×2): 50 ug via INTRAVENOUS

## 2018-11-11 MED ORDER — PROPOFOL 500 MG/50ML IV EMUL
INTRAVENOUS | Status: DC | PRN
  Administered 2018-11-11: 100 ug/kg/min via INTRAVENOUS

## 2018-11-11 MED ORDER — MIDAZOLAM HCL 2 MG/2ML IJ SOLN
INTRAMUSCULAR | Status: DC | PRN
  Administered 2018-11-11 (×2): 1 mg via INTRAVENOUS

## 2018-11-11 MED ORDER — LIDOCAINE HCL (PF) 2 % IJ SOLN
INTRAMUSCULAR | Status: AC
  Filled 2018-11-11: qty 10

## 2018-11-11 MED ORDER — PROPOFOL 500 MG/50ML IV EMUL
INTRAVENOUS | Status: AC
  Filled 2018-11-11: qty 50

## 2018-11-11 MED ORDER — SODIUM CHLORIDE 0.9 % IV SOLN
INTRAVENOUS | Status: DC
  Administered 2018-11-11: 1000 mL via INTRAVENOUS

## 2018-11-11 NOTE — H&P (Signed)
Jonathon Bellows, MD [address] [place], [place], [place], [address] [address], [place], [place], [place], [address] Phone: [phone]  Fax: [phone]  Primary Care Physician:  McLean-Scocuzza, Nino Glow, MD   Pre-Procedure History & Physical: HPI:  Cassie Jones is a 73 y.o. female is here for an endoscopy    Past Medical History:  Diagnosis Date  . Ascites   . Asthma   . Bilateral leg edema   . Cancer ([place])   . Chicken pox   . Colon polyps   . Diverticulitis    with perforation 02/2017 and hosp with colostomy bag s/p removal   . Drug-induced neutropenia ([place]) 03/09/2018  . GERD (gastroesophageal reflux disease)   . Hyperlipidemia   . Hypertension   . Multiple myeloma ([place])    dx'ed 01/2018 follows Dr. Tasia Catchings and [facility]/O   . Pleural effusion   . Pneumonia    03/02/18   . Thyroid nodule   . UTI (urinary tract infection)   . Vitamin D deficiency     Past Surgical History:  Procedure Laterality Date  . ABDOMINAL HYSTERECTOMY    . BREAST BIOPSY Bilateral yrs ago   benign  . CHOLECYSTECTOMY    . COLOSTOMY  2018  . COLOSTOMY REVERSAL     11 2018  . KYPHOPLASTY     11/2017 [place]     Prior to Admission medications   Medication Sig Start Date End Date Taking? Authorizing Provider  acetaminophen (TYLENOL) 500 MG tablet Take 1,000 mg by mouth every 6 (six) hours as needed for mild pain or fever.   Yes [provider]  acyclovir (ZOVIRAX) 400 MG tablet Take 1 tablet (400 mg total) by mouth daily. 10/28/18  Yes Earlie Server, MD  albuterol (VENTOLIN HFA) 108 (90 Base) MCG/ACT inhaler Inhale 2 puffs into the lungs every 6 (six) hours as needed. 01/15/18  Yes Verlon Au, NP  ALPRAZolam Duanne Moron) 0.5 MG tablet Take 1 tablet (0.5 mg total) by mouth 2 (two) times daily as needed for anxiety. 11/09/18  Yes Earlie Server, MD  calcium-vitamin D (OSCAL WITH D) 500-200 MG-UNIT tablet Take 1 tablet by mouth daily. 05/18/18  Yes Earlie Server, MD  Cholecalciferol 50000  units capsule Take 1 capsule (50,000 Units total) by mouth once a week. 03/04/18  Yes McLean-Scocuzza, Nino Glow, MD  dexamethasone (DECADRON) 4 MG tablet Take 10 tablets ('40mg'$ ) on days 1,8,15, and 22 of cycles 1-9. Then take on days 1,8,15 of cycles 10 and beyond. 10/28/18  Yes Earlie Server, MD  feeding supplement, ENSURE ENLIVE, (ENSURE ENLIVE) LIQD Take 237 mLs by mouth 3 (three) times daily between meals. 03/05/18  Yes Fritzi Mandes, MD  furosemide (LASIX) 20 MG tablet Take 1 tablet (20 mg total) by mouth daily as needed. 01/28/18  Yes McLean-Scocuzza, Nino Glow, MD  loperamide (IMODIUM A-D) 2 MG tablet Take 2 mg by mouth 4 (four) times daily as needed for diarrhea or loose stools.   Yes [provider]  ondansetron (ZOFRAN) 8 MG tablet Take 1 tablet (8 mg total) by mouth 2 (two) times daily as needed (Nausea or vomiting). 10/28/18  Yes Earlie Server, MD  oxyCODONE (OXY IR/ROXICODONE) 5 MG immediate release tablet Take 1 tablet (5 mg total) by mouth every 8 (eight) hours as needed for moderate pain. Patient taking differently: Take 5-10 mg by mouth every 6 (six) hours as needed for moderate pain.  06/29/18  Yes Earlie Server, MD  pantoprazole ([place])  40 MG tablet Take 1 tablet (40 mg total) by mouth every morning. 30 minutes before food 06/29/18  Yes McLean-Scocuzza, Nino Glow, MD  potassium chloride SA (K-DUR,KLOR-CON) 20 MEQ tablet Take 1 tablet (20 mEq total) by mouth daily. 07/15/18  Yes Jacquelin Hawking, NP  pravastatin (PRAVACHOL) 10 MG tablet Take 1 tablet by mouth at bedtime. 12/11/16  Yes [provider]  prochlorperazine (COMPAZINE) 10 MG tablet Take 1 tablet (10 mg total) by mouth every 6 (six) hours as needed (Nausea or vomiting). 10/28/18  Yes Earlie Server, MD  spironolactone (ALDACTONE) 25 MG tablet Take 25 mg by mouth daily.   Yes [provider]  sucralfate (CARAFATE) 1 g tablet Take 1 tablet (1 g total) by mouth 4 (four) times daily -  with meals and at bedtime. 11/03/18  Yes Jonathon Bellows, MD   tiotropium (SPIRIVA) 18 MCG inhalation capsule Place 1 capsule (18 mcg total) into inhaler and inhale daily. 05/31/18  Yes Laverle Hobby, MD  apixaban (ELIQUIS) 5 MG TABS tablet Take 1 tablet (5 mg total) by mouth 2 (two) times daily. Patient not taking: Reported on 11/09/2018 04/06/18   Earlie Server, MD  benzonatate (TESSALON) 100 MG capsule Take 100 mg by mouth 3 (three) times daily as needed.  10/19/18 11/17/18  [provider]  fluticasone (FLONASE) 50 MCG/ACT nasal spray Place 1-2 sprays into both nostrils daily. Patient not taking: Reported on 11/11/2018 03/02/18   McLean-Scocuzza, Nino Glow, MD  fluticasone (FLOVENT DISKUS) 50 MCG/BLIST diskus inhaler Inhale 1 puff into the lungs 2 (two) times daily. Patient not taking: Reported on 11/11/2018 01/22/18   Earlie Server, MD  montelukast (SINGULAIR) 10 MG tablet Take 10 mg by mouth at bedtime.    [provider]    Allergies as of 11/05/2018 - Review Complete 11/03/2018  Allergen Reaction Noted  . Codeine Nausea And Vomiting 03/19/2014    Family History  Problem Relation Age of Onset  . Breast cancer Mother 45  . Cancer Mother        breast   . Diabetes Mother   . Heart disease Mother   . Hyperlipidemia Mother   . Hypertension Mother   . Birth defects Brother   . Diabetes Brother   . Heart disease Brother   . Hyperlipidemia Brother   . Cancer Father        prostate met to liver     Social History   Socioeconomic History  . Marital status: Married    Spouse name: Not on file  . Number of children: Not on file  . Years of education: Not on file  . Highest education level: Not on file  Occupational History  . Not on file  Social Needs  . Financial resource strain: Not hard at all  . Food insecurity:    Worry: Never true    Inability: Never true  . Transportation needs:    Medical: No    Non-medical: No  Tobacco Use  . Smoking status: Former Research scientist (life sciences)  . Smokeless tobacco: Never Used  Substance and Sexual  Activity  . Alcohol use: Not Currently  . Drug use: Never  . Sexual activity: Yes    Comment: husband   Lifestyle  . Physical activity:    Days per week: 0 days    Minutes per session: 0 min  . Stress: Not at all  Relationships  . Social connections:    Talks on phone: More than three times a week  Gets together: Once a week    Attends religious service: 1 to 4 times per year    Active member of club or organization: No    Attends meetings of clubs or organizations: Never    Relationship status: Married  . Intimate partner violence:    Fear of current or ex partner: No    Emotionally abused: No    Physically abused: No    Forced sexual activity: No  Other Topics Concern  . Not on file  Social History Narrative   Married    Husband lives in [place] while she gets treatment in this area    She lives with sister in law when receiving treatment    Review of Systems: See HPI, otherwise negative ROS  Physical Exam: BP (!) 153/86   Pulse 75   Temp (!) 96 F (35.6 C) (Tympanic)   Resp 18   Ht '5\' 2"'$  (1.575 m)   Wt 66.2 kg   SpO2 99%   BMI 26.70 kg/m  General:   Alert,  pleasant and cooperative in NAD Head:  Normocephalic and atraumatic. Neck:  Supple; no masses or thyromegaly. Lungs:  Clear throughout to auscultation, normal respiratory effort.    Heart:  +S1, +S2, Regular rate and rhythm, No edema. Abdomen:  Soft, nontender and nondistended. Normal bowel sounds, without guarding, and without rebound.   Neurologic:  Alert and  oriented x4;  grossly normal neurologically.  Impression/Plan: Cassie Jones is here for an endoscopy  to be performed for  evaluation of dysphagia    Risks, benefits, limitations, and alternatives regarding endoscopy and dilation have been reviewed with the patient.  Questions have been answered.  All parties agreeable.   Jonathon Bellows, MD  11/11/2018, 8:08 AM

## 2018-11-11 NOTE — Anesthesia Postprocedure Evaluation (Signed)
Anesthesia Post Note  Patient: Cassie Jones  Procedure(s) Performed: ESOPHAGOGASTRODUODENOSCOPY (EGD) WITH PROPOFOL (N/A )  Patient location during evaluation: PACU Anesthesia Type: General Level of consciousness: awake and alert Pain management: pain level controlled Vital Signs Assessment: post-procedure vital signs reviewed and stable Respiratory status: spontaneous breathing, nonlabored ventilation and respiratory function stable Cardiovascular status: blood pressure returned to baseline and stable Postop Assessment: no apparent nausea or vomiting Anesthetic complications: no     Last Vitals:  Vitals:   11/11/18 0835 11/11/18 0845  BP: (!) 146/60 (!) 163/73  Pulse:    Resp: 18   Temp: (!) 36.1 C   SpO2: 95%     Last Pain:  Vitals:   11/11/18 0905  TempSrc:   PainSc: 0-No pain                 Durenda Hurt

## 2018-11-11 NOTE — Anesthesia Preprocedure Evaluation (Addendum)
Anesthesia Evaluation  Patient identified by MRN, date of birth, ID band Patient awake    Reviewed: Allergy & Precautions, H&P , NPO status , Patient's Chart, lab work & pertinent test results  Airway Mallampati: II  TM Distance: <3 FB    Comment: Small chin Dental  (+) Teeth Intact, Caps   Pulmonary asthma , pneumonia (treated for PNA earlier this month, now feels back to baseline, no residual cough), resolved, former smoker,           Cardiovascular hypertension,      Neuro/Psych negative neurological ROS  negative psych ROS   GI/Hepatic Neg liver ROS, GERD  Controlled,  Endo/Other  negative endocrine ROS  Renal/GU negative Renal ROS  negative genitourinary   Musculoskeletal   Abdominal   Peds  Hematology  (+) Blood dyscrasia, anemia ,   Anesthesia Other Findings Past Medical History: No date: Ascites No date: Asthma No date: Bilateral leg edema No date: Cancer ([place]) No date: Chicken pox No date: Colon polyps No date: Diverticulitis     Comment:  with perforation 02/2017 and hosp with colostomy bag s/p               removal  03/09/2018: Drug-induced neutropenia ([facility]) No date: GERD (gastroesophageal reflux disease) No date: Hyperlipidemia No date: Hypertension No date: Multiple myeloma ([place])     Comment:  dx'ed 01/2018 follows Dr. Tasia Catchings and [facility] H/O  No date: Pleural effusion No date: Pneumonia     Comment:  03/02/18  No date: Thyroid nodule No date: UTI (urinary tract infection) No date: Vitamin D deficiency  Past Surgical History: No date: ABDOMINAL HYSTERECTOMY yrs ago: BREAST BIOPSY; Bilateral     Comment:  benign No date: CHOLECYSTECTOMY 2018: COLOSTOMY No date: COLOSTOMY REVERSAL     Comment:  11 2018 No date: KYPHOPLASTY     Comment:  11/2017 [place]   BMI    Body Mass Index:  26.70 kg/m      Reproductive/Obstetrics negative OB ROS                            Anesthesia Physical Anesthesia Plan  ASA: III  Anesthesia Plan: General   Post-op Pain Management:    Induction:   PONV Risk Score and Plan: Propofol infusion and TIVA  Airway Management Planned:   Additional Equipment:   Intra-op Plan:   Post-operative Plan:   Informed Consent: I have reviewed the patients History and Physical, chart, labs and discussed the procedure including the risks, benefits and alternatives for the proposed anesthesia with the patient or authorized representative who has indicated his/her understanding and acceptance.     Dental Advisory Given  Plan Discussed with: Anesthesiologist and CRNA  Anesthesia Plan Comments:         Anesthesia Quick Evaluation

## 2018-11-11 NOTE — Op Note (Signed)
[facility] Gastroenterology Patient Name: Cassie Jones Procedure Date: 11/11/2018 7:37 AM MRN: [id number] Account #: [id number] Date of Birth: May 04, 1946 Admit Type: Outpatient Age: 73 Room: [facility] ENDO ROOM 3 Gender: Female Note Status: Finalized Procedure:            Upper GI endoscopy Indications:          Dysphagia Providers:            Jonathon Bellows MD, MD Referring MD:         Nino Glow Mclean-Scocuzza MD, MD (Referring MD) Medicines:            Monitored Anesthesia Care Complications:        No immediate complications. Procedure:            Pre-Anesthesia Assessment:                       - Prior to the procedure, a History and Physical was                        performed, and patient medications, allergies and                        sensitivities were reviewed. The patient's tolerance of                        previous anesthesia was reviewed.                       - The risks and benefits of the procedure and the                        sedation options and risks were discussed with the                        patient. All questions were answered and informed                        consent was obtained.                       - ASA Grade Assessment: II - A patient with mild                        systemic disease.                       After obtaining informed consent, the endoscope was                        passed under direct vision. Throughout the procedure,                        the patient's blood pressure, pulse, and oxygen                        saturations were monitored continuously. The Endoscope                        was introduced through the mouth, and advanced to the  third part of duodenum. The upper GI endoscopy was                        accomplished with ease. The patient tolerated the                        procedure well. Findings:      The examined duodenum was normal.      A medium-sized hiatal hernia was  present.      The cardia and gastric fundus were normal on retroflexion.      One benign-appearing, intrinsic moderate (circumferential scarring or       stenosis; an endoscope may pass) stenosis was found at the       gastroesophageal junction. This stenosis measured less than one cm (in       length). The stenosis was traversed. A TTS dilator was passed through       the scope. Dilation with a 12-13.5-15 mm balloon and a 15-16.5-18 mm       balloon dilator was performed to 18 mm. The dilation site was examined       and showed complete resolution of luminal narrowing.      Patchy mucosal changes characterized by congestion and white specks were       found in the entire esophagus. Biopsies were taken with a cold forceps       for histology. R/o EOE,Candida,HSV,CMV      The exam was otherwise without abnormality. Impression:           - Normal examined duodenum.                       - Medium-sized hiatal hernia.                       - Benign-appearing esophageal stenosis. Dilated.                       - Congested, white specked mucosa in the esophagus.                        Biopsied.                       - The examination was otherwise normal. Recommendation:       - Discharge patient to home (with escort).                       - Resume previous diet.                       - Continue present medications.                       - Await pathology results.                       - Return to my office in 1 week. Procedure Code(s):    --- Professional ---                       [phone], Esophagogastroduodenoscopy, flexible, transoral;                        with transendoscopic balloon dilation of esophagus                        (  less than 30 mm diameter)                       43239, 59, Esophagogastroduodenoscopy, flexible,                        transoral; with biopsy, single or multiple Diagnosis Code(s):    --- Professional ---                       K44.9, Diaphragmatic hernia  without obstruction or                        gangrene                       K22.2, Esophageal obstruction                       K22.8, Other specified diseases of esophagus                       R13.10, Dysphagia, unspecified CPT copyright 2018 American Medical Association. All rights reserved. The codes documented in this report are preliminary and upon coder review may  be revised to meet current compliance requirements. Jonathon Bellows, MD Jonathon Bellows MD, MD 11/11/2018 8:31:54 AM This report has been signed electronically. Number of Addenda: 0 Note Initiated On: 11/11/2018 7:37 AM      [facility]

## 2018-11-11 NOTE — Brief Op Note (Signed)
Specimens sent to lab for CMV and HSV, also KOH

## 2018-11-11 NOTE — Transfer of Care (Signed)
Immediate Anesthesia Transfer of Care Note  Patient: Cassie Jones  Procedure(s) Performed: ESOPHAGOGASTRODUODENOSCOPY (EGD) WITH PROPOFOL (N/A )  Patient Location: PACU  Anesthesia Type:General  Level of Consciousness: awake and sedated  Airway & Oxygen Therapy: Patient Spontanous Breathing and Patient connected to nasal cannula oxygen  Post-op Assessment: Report given to RN and Post -op Vital signs reviewed and stable  Post vital signs: Reviewed and stable  Last Vitals:  Vitals Value Taken Time  BP    Temp    Pulse    Resp    SpO2      Last Pain:  Vitals:   11/11/18 0741  TempSrc: Tympanic  PainSc: 0-No pain         Complications: No apparent anesthesia complications

## 2018-11-11 NOTE — Anesthesia Procedure Notes (Signed)
Performed by: Cook-Martin, Mattisyn Cardona Pre-anesthesia Checklist: Patient identified, Emergency Drugs available, Suction available, Patient being monitored and Timeout performed Patient Re-evaluated:Patient Re-evaluated prior to induction Oxygen Delivery Method: Nasal cannula Preoxygenation: Pre-oxygenation with 100% oxygen Induction Type: IV induction Airway Equipment and Method: Bite block Placement Confirmation: CO2 detector and positive ETCO2       

## 2018-11-11 NOTE — Anesthesia Post-op Follow-up Note (Signed)
Anesthesia QCDR form completed.        

## 2018-11-12 ENCOUNTER — Other Ambulatory Visit: Payer: Self-pay

## 2018-11-12 ENCOUNTER — Encounter: Payer: Self-pay | Admitting: Gastroenterology

## 2018-11-12 ENCOUNTER — Telehealth: Payer: Self-pay

## 2018-11-12 LAB — SURGICAL PATHOLOGY

## 2018-11-12 MED ORDER — FLUCONAZOLE 200 MG PO TABS: 200.0000 mg | ORAL_TABLET | Freq: Every day | ORAL | 0 refills | Status: AC

## 2018-11-12 NOTE — Telephone Encounter (Signed)
-----   Message from Jonathon Bellows, MD sent at 11/11/2018 10:19 AM EST ----- Sherald Hess inform - yeast seen on lab tests of her esophagus bx- commence on Diflucan 200 mg daily for 14 days- inform likely candida caused her painful swallowing  C/c McLean-Scocuzza, Nino Glow, MD    Dr Jonathon Bellows MD,MRCP [facility]) Gastroenterology/Hepatology Pager: [phone]

## 2018-11-12 NOTE — Telephone Encounter (Signed)
Spoke with pt and informed her of biopsy results and Dr. Georgeann Oppenheim directions to commence on Diflucan 200mg  daily for 14 days for treatment. I explained to pt that we will send the prescription to her preferred pharmacy.

## 2018-11-13 LAB — HSV DNA BY PCR (REFERENCE LAB): HSV 2 DNA: NEGATIVE

## 2018-11-13 LAB — HERPES SIMPLEX VIRUS(HSV) DNA BY PCR: HSV 1 DNA: POSITIVE — AB

## 2018-11-16 ENCOUNTER — Inpatient Hospital Stay: Payer: Medicare PPO

## 2018-11-16 VITALS — BP 121/76 | HR 82 | Temp 98.3°F | Resp 20

## 2018-11-16 DIAGNOSIS — C9002 Multiple myeloma in relapse: Secondary | ICD-10-CM

## 2018-11-16 DIAGNOSIS — Z5111 Encounter for antineoplastic chemotherapy: Secondary | ICD-10-CM | POA: Diagnosis not present

## 2018-11-16 LAB — CBC WITH DIFFERENTIAL/PLATELET
ABS IMMATURE GRANULOCYTES: 0.05 10*3/uL (ref 0.00–0.07)
BASOS ABS: 0 10*3/uL (ref 0.0–0.1)
Basophils Relative: 0 %
Eosinophils Absolute: 0.2 10*3/uL (ref 0.0–0.5)
Eosinophils Relative: 3 %
HCT: 35 % — ABNORMAL LOW (ref 36.0–46.0)
Hemoglobin: 10.9 g/dL — ABNORMAL LOW (ref 12.0–15.0)
IMMATURE GRANULOCYTES: 1 %
Lymphocytes Relative: 22 %
Lymphs Abs: 1.4 10*3/uL (ref 0.7–4.0)
MCH: 29.1 pg (ref 26.0–34.0)
MCHC: 31.1 g/dL (ref 30.0–36.0)
MCV: 93.6 fL (ref 80.0–100.0)
Monocytes Absolute: 1.7 10*3/uL — ABNORMAL HIGH (ref 0.1–1.0)
Monocytes Relative: 28 %
NEUTROS ABS: 2.9 10*3/uL (ref 1.7–7.7)
NRBC: 0 % (ref 0.0–0.2)
Neutrophils Relative %: 46 %
Platelets: 128 10*3/uL — ABNORMAL LOW (ref 150–400)
RBC: 3.74 MIL/uL — ABNORMAL LOW (ref 3.87–5.11)
RDW: 16.1 % — ABNORMAL HIGH (ref 11.5–15.5)
WBC: 6.2 10*3/uL (ref 4.0–10.5)

## 2018-11-16 LAB — COMPREHENSIVE METABOLIC PANEL
ALT: 20 U/L (ref 0–44)
AST: 19 U/L (ref 15–41)
Albumin: 3.1 g/dL — ABNORMAL LOW (ref 3.5–5.0)
Alkaline Phosphatase: 49 U/L (ref 38–126)
Anion gap: 9 (ref 5–15)
BUN: 14 mg/dL (ref 8–23)
CO2: 26 mmol/L (ref 22–32)
Calcium: 9.4 mg/dL (ref 8.9–10.3)
Chloride: 104 mmol/L (ref 98–111)
Creatinine, Ser: 0.83 mg/dL (ref 0.44–1.00)
GFR calc Af Amer: >60 mL/min (ref >60)
GFR calc non Af Amer: >60 mL/min (ref >60)
Glucose, Bld: 112 mg/dL — ABNORMAL HIGH (ref 70–99)
Potassium: 3.9 mmol/L (ref 3.5–5.1)
Sodium: 139 mmol/L (ref 135–145)
Total Bilirubin: 1 mg/dL (ref 0.3–1.2)
Total Protein: 7.5 g/dL (ref 6.5–8.1)

## 2018-11-16 MED ORDER — SODIUM CHLORIDE 0.9 % IV SOLN
Freq: Once | INTRAVENOUS | Status: AC
  Administered 2018-11-16: 12:00:00 via INTRAVENOUS
  Filled 2018-11-16: qty 250

## 2018-11-16 MED ORDER — DEXTROSE 5 % IV SOLN
120.0000 mg | Freq: Once | INTRAVENOUS | Status: AC
  Administered 2018-11-16: 120 mg via INTRAVENOUS
  Filled 2018-11-16: qty 60

## 2018-11-16 MED ORDER — PROCHLORPERAZINE MALEATE 10 MG PO TABS
10.0000 mg | ORAL_TABLET | Freq: Once | ORAL | Status: AC
  Administered 2018-11-16: 10 mg via ORAL
  Filled 2018-11-16: qty 1

## 2018-11-18 ENCOUNTER — Ambulatory Visit (INDEPENDENT_AMBULATORY_CARE_PROVIDER_SITE_OTHER): Payer: Medicare PPO | Admitting: Gastroenterology

## 2018-11-18 ENCOUNTER — Encounter: Payer: Self-pay | Admitting: Gastroenterology

## 2018-11-18 VITALS — BP 134/76 | HR 67 | Ht 62.0 in | Wt 145.6 lb

## 2018-11-18 DIAGNOSIS — R1310 Dysphagia, unspecified: Secondary | ICD-10-CM | POA: Diagnosis not present

## 2018-11-18 DIAGNOSIS — B3781 Candidal esophagitis: Secondary | ICD-10-CM | POA: Diagnosis not present

## 2018-11-18 DIAGNOSIS — B0089 Other herpesviral infection: Secondary | ICD-10-CM

## 2018-11-18 DIAGNOSIS — K208 Other esophagitis without bleeding: Secondary | ICD-10-CM

## 2018-11-18 NOTE — Progress Notes (Signed)
Jonathon Bellows MD, MRCP(U.K) [address]  [place]  [place], [place] [address]  Main: [phone]  Fax: [phone]   Primary Care Physician: McLean-Scocuzza, Nino Glow, MD  Primary Gastroenterologist:  Dr. Jonathon Bellows   No chief complaint on file.   HPI: Cassie Jones is a 73 y.o. female    Summary of history :  She was initially referred and seen on 11/03/2018 for dysphagia.  She follows with Dr. Tasia Catchings for multiple myeloma and on chemotherapy.  Dysphagia began acutely a week prior to her initial visit.  Associated with odynophagia.  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  The upper and middle part of the esophagus appeared to be covered with a slough-like material and did not have a completely normal appearance and hence biopsies were taken.  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  It was negative for cytomegalovirus.   Presently no odynophagia or dysphagia.    Current Outpatient Medications  Medication Sig Dispense Refill  . acetaminophen (TYLENOL) 500 MG tablet Take 1,000 mg by mouth every 6 (six) hours as needed for mild pain or fever.    Marland Kitchen acyclovir (ZOVIRAX) 400 MG tablet Take 1 tablet (400 mg total) by mouth daily. 30 tablet 3  . albuterol (VENTOLIN HFA) 108 (90 Base) MCG/ACT inhaler Inhale 2 puffs into the lungs every 6 (six) hours as needed. 1 Inhaler 1  . ALPRAZolam (XANAX) 0.5 MG tablet Take 1 tablet (0.5 mg total) by mouth 2 (two) times daily as needed for anxiety. 2 tablet 0  . apixaban (ELIQUIS) 5 MG TABS tablet Take 1 tablet (5 mg total) by mouth 2 (two) times daily. (Patient not taking: Reported on 11/09/2018) 60 tablet 3  . calcium-vitamin D (OSCAL WITH D) 500-200 MG-UNIT tablet Take 1 tablet by mouth daily. 30 tablet 0  . Cholecalciferol 50000 units capsule Take 1 capsule (50,000 Units total) by mouth once a week. 13 capsule 1  . dexamethasone  (DECADRON) 4 MG tablet Take 10 tablets ('40mg'$ ) on days 1,8,15, and 22 of cycles 1-9. Then take on days 1,8,15 of cycles 10 and beyond. 40 tablet 4  . feeding supplement, ENSURE ENLIVE, (ENSURE ENLIVE) LIQD Take 237 mLs by mouth 3 (three) times daily between meals. 237 mL 12  . fluconazole (DIFLUCAN) 200 MG tablet Take 1 tablet (200 mg total) by mouth daily for 14 days. 14 tablet 0  . fluticasone (FLONASE) 50 MCG/ACT nasal spray Place 1-2 sprays into both nostrils daily. (Patient not taking: Reported on 11/11/2018) 16 g 2  . fluticasone (FLOVENT DISKUS) 50 MCG/BLIST diskus inhaler Inhale 1 puff into the lungs 2 (two) times daily. (Patient not taking: Reported on 11/11/2018) 1 Inhaler 3  . furosemide (LASIX) 20 MG tablet Take 1 tablet (20 mg total) by mouth daily as needed. 30 tablet 2  . loperamide (IMODIUM A-D) 2 MG tablet Take 2 mg by mouth 4 (four) times daily as needed for diarrhea or loose stools.    . montelukast (SINGULAIR) 10 MG tablet Take 10 mg by mouth at bedtime.    . ondansetron (ZOFRAN) 8 MG tablet Take 1 tablet (8 mg total) by mouth 2 (two) times daily as needed (Nausea or vomiting). 30 tablet 1  . oxyCODONE (OXY IR/ROXICODONE) 5 MG immediate release tablet Take 1 tablet (5 mg total) by mouth every 8 (eight) hours as needed for moderate pain. (Patient taking  differently: Take 5-10 mg by mouth every 6 (six) hours as needed for moderate pain. ) 30 tablet 0  . pantoprazole (PROTONIX) 40 MG tablet Take 1 tablet (40 mg total) by mouth every morning. 30 minutes before food 90 tablet 1  . potassium chloride SA (K-DUR,KLOR-CON) 20 MEQ tablet Take 1 tablet (20 mEq total) by mouth daily. 30 tablet 2  . pravastatin (PRAVACHOL) 10 MG tablet Take 1 tablet by mouth at bedtime.    . prochlorperazine (COMPAZINE) 10 MG tablet Take 1 tablet (10 mg total) by mouth every 6 (six) hours as needed (Nausea or vomiting). 30 tablet 1  . spironolactone (ALDACTONE) 25 MG tablet Take 25 mg by mouth daily.    .  sucralfate (CARAFATE) 1 g tablet Take 1 tablet (1 g total) by mouth 4 (four) times daily -  with meals and at bedtime. 120 tablet 2  . tiotropium (SPIRIVA) 18 MCG inhalation capsule Place 1 capsule (18 mcg total) into inhaler and inhale daily. 30 capsule 6   No current facility-administered medications for this visit.    Facility-Administered Medications Ordered in Other Visits  Medication Dose Route Frequency Provider Last Rate Last Dose  . potassium chloride 20 mEq in 100 mL IVPB  20 mEq Intravenous Once Earlie Server, MD        Allergies as of 11/18/2018 - Review Complete 11/11/2018  Allergen Reaction Noted  . Codeine Nausea And Vomiting 03/19/2014    ROS:  General: Negative for anorexia, weight loss, fever, chills, fatigue, weakness. ENT: Negative for hoarseness, difficulty swallowing , nasal congestion. CV: Negative for chest pain, angina, palpitations, dyspnea on exertion, peripheral edema.  Respiratory: Negative for dyspnea at rest, dyspnea on exertion, cough, sputum, wheezing.  GI: See history of present illness. GU:  Negative for dysuria, hematuria, urinary incontinence, urinary frequency, nocturnal urination.  Endo: Negative for unusual weight change.    Physical Examination:   There were no vitals taken for this visit.  General: Well-nourished, well-developed in no acute distress.  Eyes: No icterus. Conjunctivae pink. Mouth: Oropharyngeal mucosa moist and pink , no lesions erythema or exudate. Lungs: Clear to auscultation bilaterally. Non-labored. Heart: Regular rate and rhythm, no murmurs rubs or gallops.  Abdomen: Bowel sounds are normal, nontender, nondistended, no hepatosplenomegaly or masses, no abdominal bruits or hernia , no rebound or guarding.   Extremities: No lower extremity edema. No clubbing or deformities. Neuro: Alert and oriented x 3.  Grossly intact. Skin: Warm and dry, no jaundice.   Psych: Alert and cooperative, normal mood and affect.   Imaging  Studies: Dg Chest 2 View  Result Date: 10/21/2018 CLINICAL DATA:  Worsening shortness of breath since hospital discharge 2 days ago. EXAM: CHEST - 2 VIEW COMPARISON:  Chest x-ray dated Mar 02, 2018. FINDINGS: The heart size and mediastinal contours are within normal limits. Normal pulmonary vascularity. Atherosclerotic calcification of the aortic arch. Biapical pleuroparenchymal scarring. No focal consolidation, pleural effusion, or pneumothorax. No acute osseous abnormality. IMPRESSION: No active cardiopulmonary disease. Electronically Signed   By: Titus Dubin M.D.   On: 10/21/2018 14:54   Mr Lumbar Spine W Wo Contrast  Result Date: 11/11/2018 CLINICAL DATA:  Multiple myeloma follow-up EXAM: MRI LUMBAR SPINE WITHOUT AND WITH CONTRAST TECHNIQUE: Multiplanar and multiecho pulse sequences of the lumbar spine were obtained without and with intravenous contrast. CONTRAST:  6 cc Gadavist intravenous COMPARISON:  Report from lumbar spine MRI at outside facility 11/23/2017 FINDINGS: Segmentation:  Standard based on the available coverage. Alignment:  Slight  anterolisthesis at L5-S1 Vertebrae: Remote L2 and L3 compression fractures with L3 cement augmentation. No residual marrow edema. No acute fracture. Prominent fatty marrow within the lumbar vertebrae of L2 below, suspect prior radiotherapy. No evidence of bone lesion or myeloma. Conus medullaris and cauda equina: Conus extends to the L1 level. Conus and cauda equina appear normal. Paraspinal and other soft tissues: No worrisome finding Disc levels: T12- L1: Unremarkable. L1-L2: Spondylosis with mild disc narrowing and bulging. No impingement L2-L3: Spondylosis and mild disc bulging.  No impingement L3-L4: Mild annulus bulging. L4-L5: Disc narrowing and bulging. Mild degenerative posterior element hypertrophy. No neural compression L5-S1:Prior surgery versus dysmorphic posterior elements. Minor annulus bulging and asymmetric right facet spurring. No  impingement. IMPRESSION: 1. No evidence of myeloma. 2. Remote L2 and L3 compression fractures. Electronically Signed   By: Monte Fantasia M.D.   On: 11/11/2018 08:48    Assessment and Plan:   Cassie Jones is a 73 y.o. y/o female here to follow-up for odynophagia and dysphagia.  Recent EGD demonstrated a GE junction stricture that was dilated.  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  Commenced on Diflucan and has received a course of acyclovir.All symptoms have resolved   Plan  F/u as needed    Dr Jonathon Bellows  MD,MRCP [facility])

## 2018-11-19 ENCOUNTER — Other Ambulatory Visit: Payer: Self-pay | Admitting: Internal Medicine

## 2018-11-19 DIAGNOSIS — I10 Essential (primary) hypertension: Secondary | ICD-10-CM

## 2018-11-19 LAB — CYTOMEGALOVIRUS (CMV) CULTURE - CMVCUL

## 2018-11-19 MED ORDER — SPIRONOLACTONE 25 MG PO TABS: 25.0000 mg | ORAL_TABLET | Freq: Every day | ORAL | 3 refills | Status: AC

## 2018-12-19 DEATH — deceased

## 2019-02-02 ENCOUNTER — Ambulatory Visit: Payer: Medicare PPO | Admitting: Internal Medicine

## 2019-10-21 DEATH — deceased

## 2019-11-15 IMAGING — NM NM PULMONARY VENT & PERF
2 series · 16 of 16 positions shown · non-contrast
Comparison: Chest radiograph January 14, 2018

CLINICAL DATA: Shortness of Breath

EXAM:
NUCLEAR MEDICINE VENTILATION - PERFUSION LUNG SCAN
VIEWS:
Anterior, posterior, right lateral, left lateral, RPO, LPO, RAO,
LAO-ventilation and perfusion
RADIOPHARMACEUTICALS:  32.56 mCi of Mc-EEm DTPA aerosol inhalation
and 4.03 mCi ZcXXm-GLL IV

[Series 1000: lung perfusion · 1.95mm/px · 4 acquisitions, 8 frames shown]
[im 1/4]
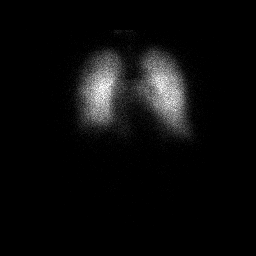
[im 1/4]
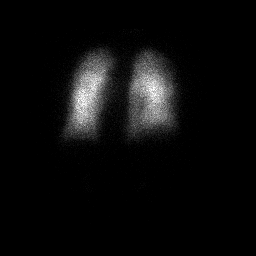
[im 2/4]
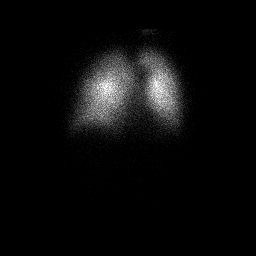
[im 2/4]
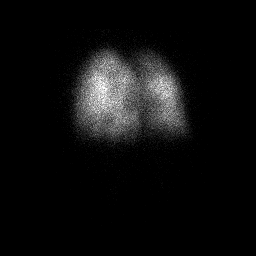
[im 3/4]
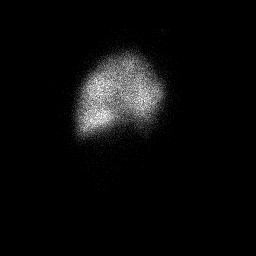
[im 3/4]
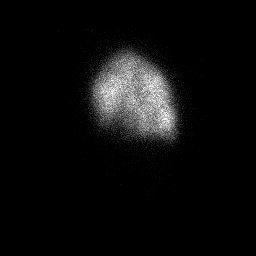
[im 4/4]
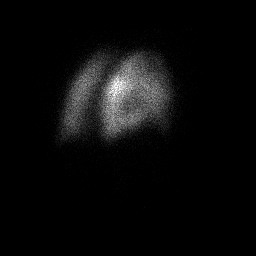
[im 4/4]
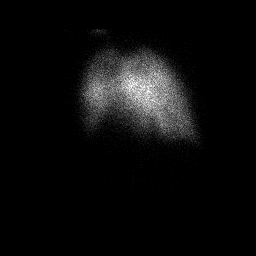

[Series 1000: lung ventilation · 3.90mm/px · 4 acquisitions, 8 frames shown]
[im 1/4]
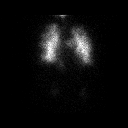
[im 1/4]
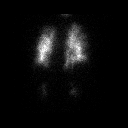
[im 2/4]
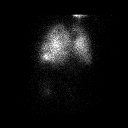
[im 2/4]
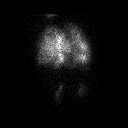
[im 3/4]
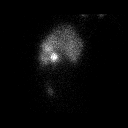
[im 3/4]
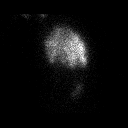
[im 4/4]
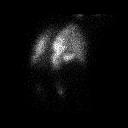
[im 4/4]
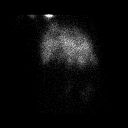

[16 of 16 positions shown; findings below may reference images not displayed]

FINDINGS: Ventilation: There are multiple patchy subsegmental defects
throughout the lungs bilaterally on the ventilation study. No
well-defined segmental ventilation defect is evident.

Perfusion: There are a few rather minimal subsegmental defects on
the perfusion study. No segmental or major subsegmental perfusion
defect identified. Perfusion defects are smaller than corresponding
ventilation defects. There is no perfusion defect disproportionate
to a ventilation defect.
IMPRESSION: There are scattered small perfusion defects, none of which are
segmental or significant subsegmental. There is no perfusion defect
disproportionate to a ventilation defect. Several ventilation
defects are larger than corresponding perfusion defects. This study
constitutes a low probability of pulmonary embolus.

## 2019-11-15 IMAGING — CR DG CHEST 2V
2 series · 2 of 2 positions shown · non-contrast
Comparison: Chest x-ray report 02/26/2015.

CLINICAL DATA: Cough and congestion.

EXAM:
CHEST - 2 VIEW

[chest pa]
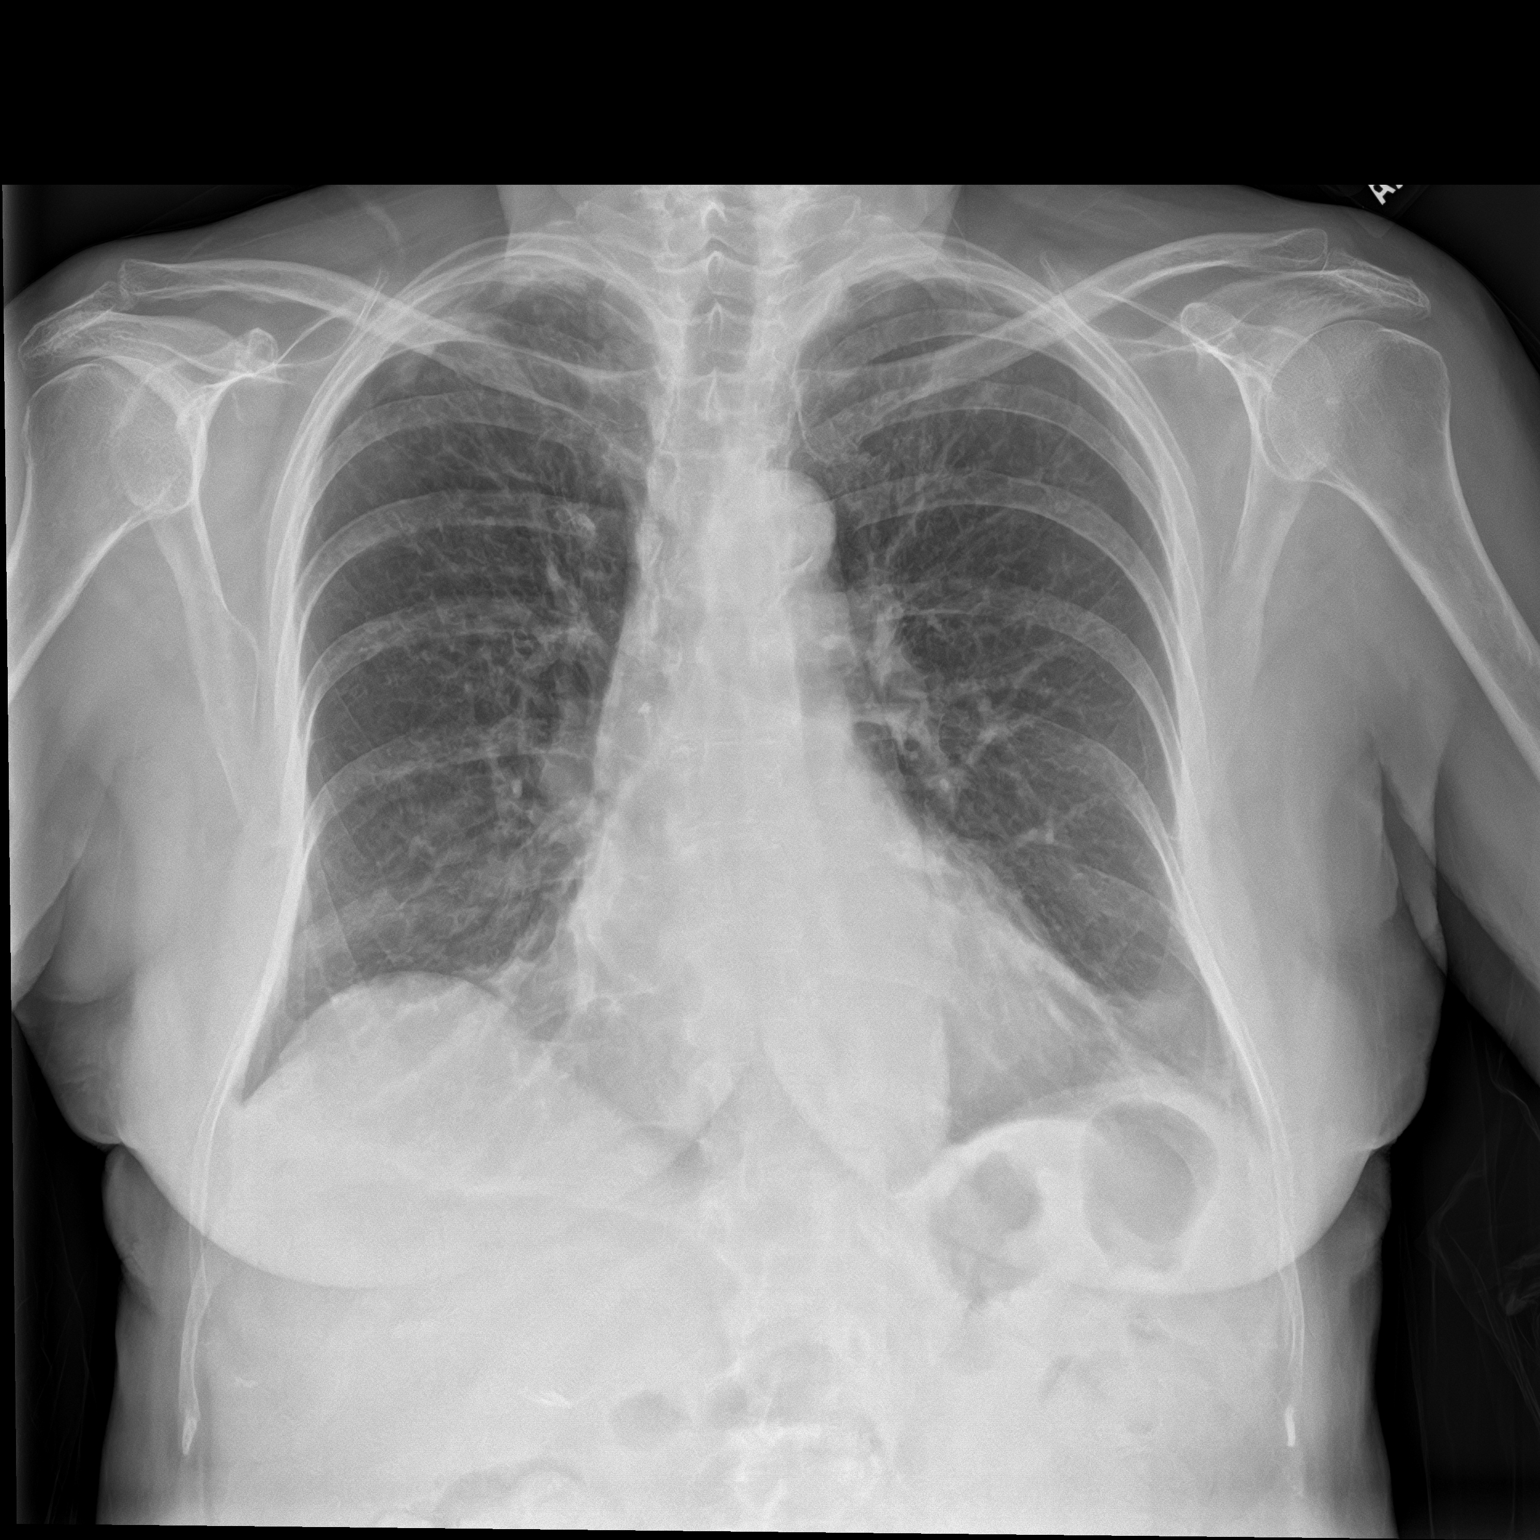

[chest lat]
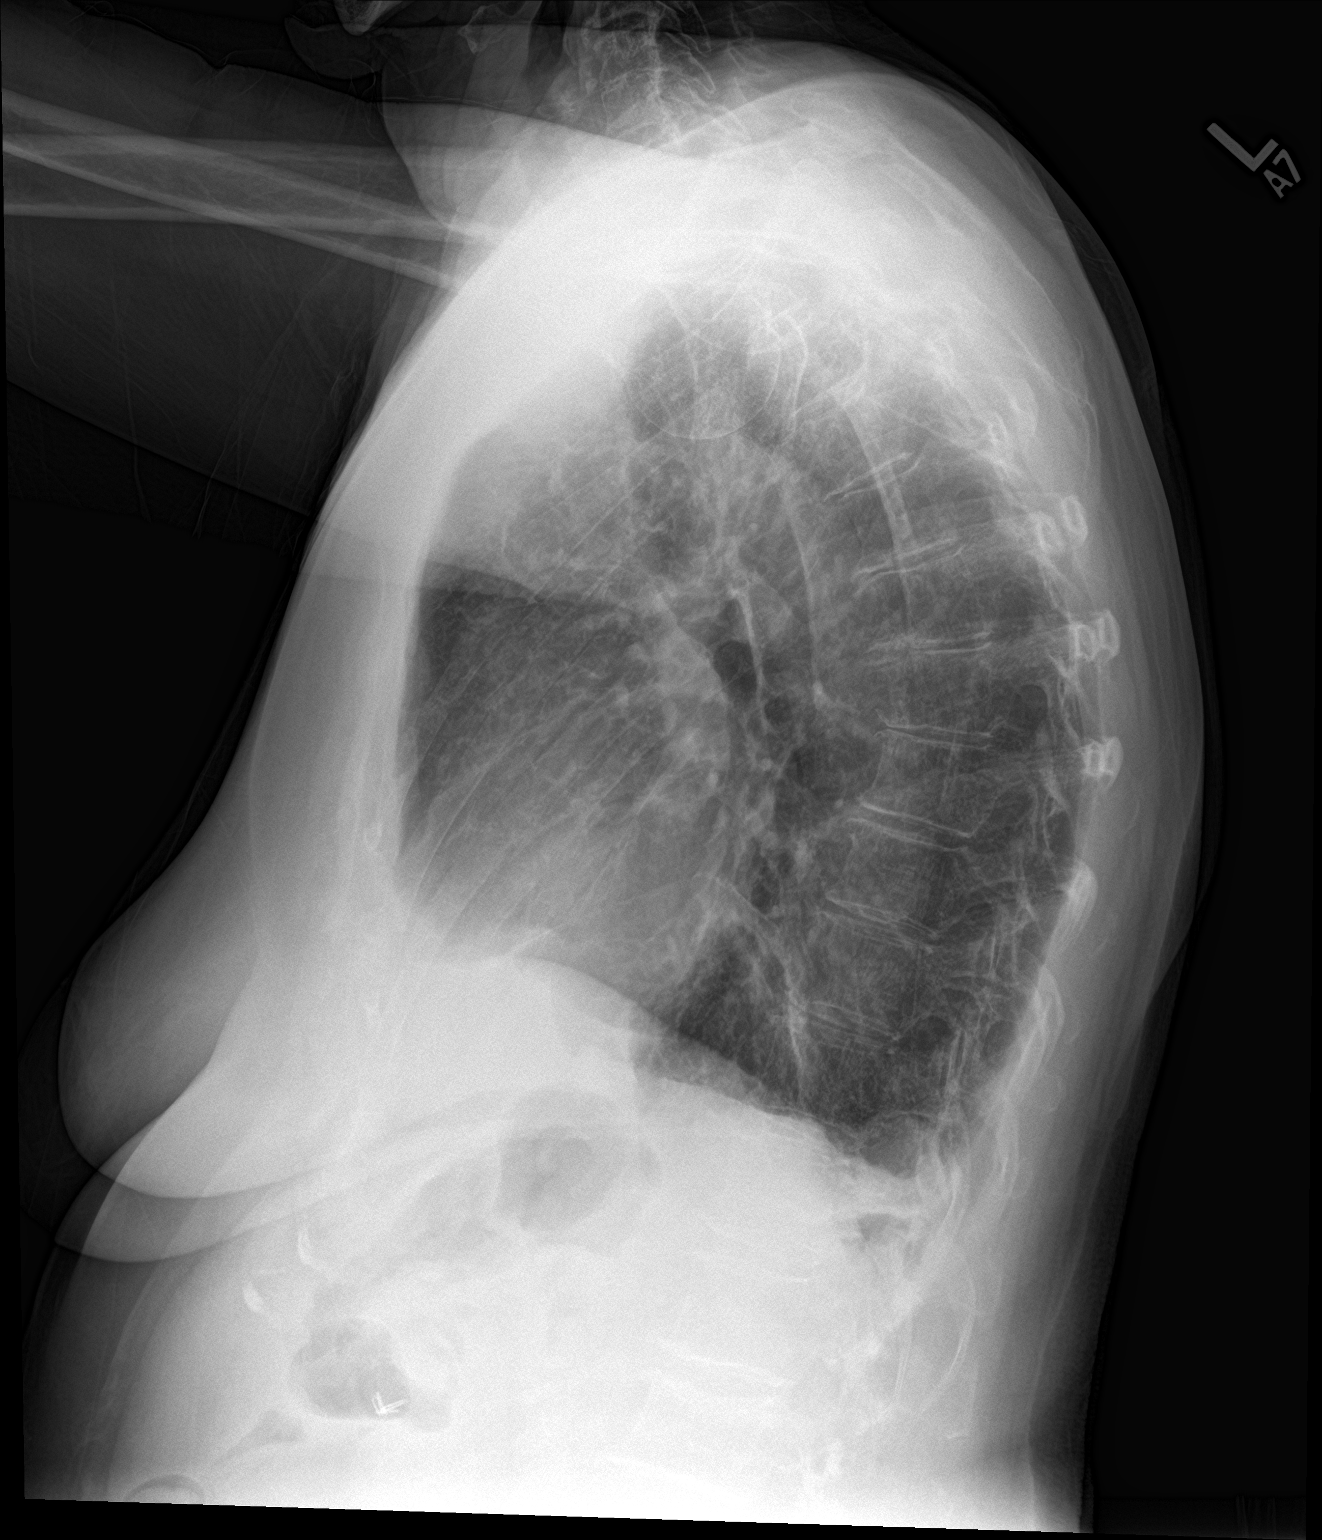

[2 of 2 positions shown; findings below may reference images not displayed]

FINDINGS: Mediastinum and hilar structures are normal. Cardiomegaly with
normal pulmonary vascularity. COPD. Mild bibasilar
atelectasis/infiltrates. Tiny bilateral pleural effusions may be
present. Biapical pleural thickening noted consistent scarring.
Carotid vascular calcification. Surgical clips right upper quadrant.
IMPRESSION: 1. Mild bibasilar atelectasis/infiltrates. Tiny bilateral pleural
effusions may be present.

2.  COPD.  Pleural-parenchymal scarring.

3.  Cardiomegaly.  No pulmonary venous congestion.

4.  Carotid vascular disease.

## 2019-12-07 IMAGING — CR DG CHEST 2V
1 series · 2 of 2 positions shown · non-contrast
Comparison: January 14, 2018

CLINICAL DATA: Cough and wheezing.  Multiple myeloma

EXAM:
CHEST - 2 VIEW

[Series 1: dg chest 2 view · 0.14mm/px · 2 of 2 slices shown]
[im 1/2]
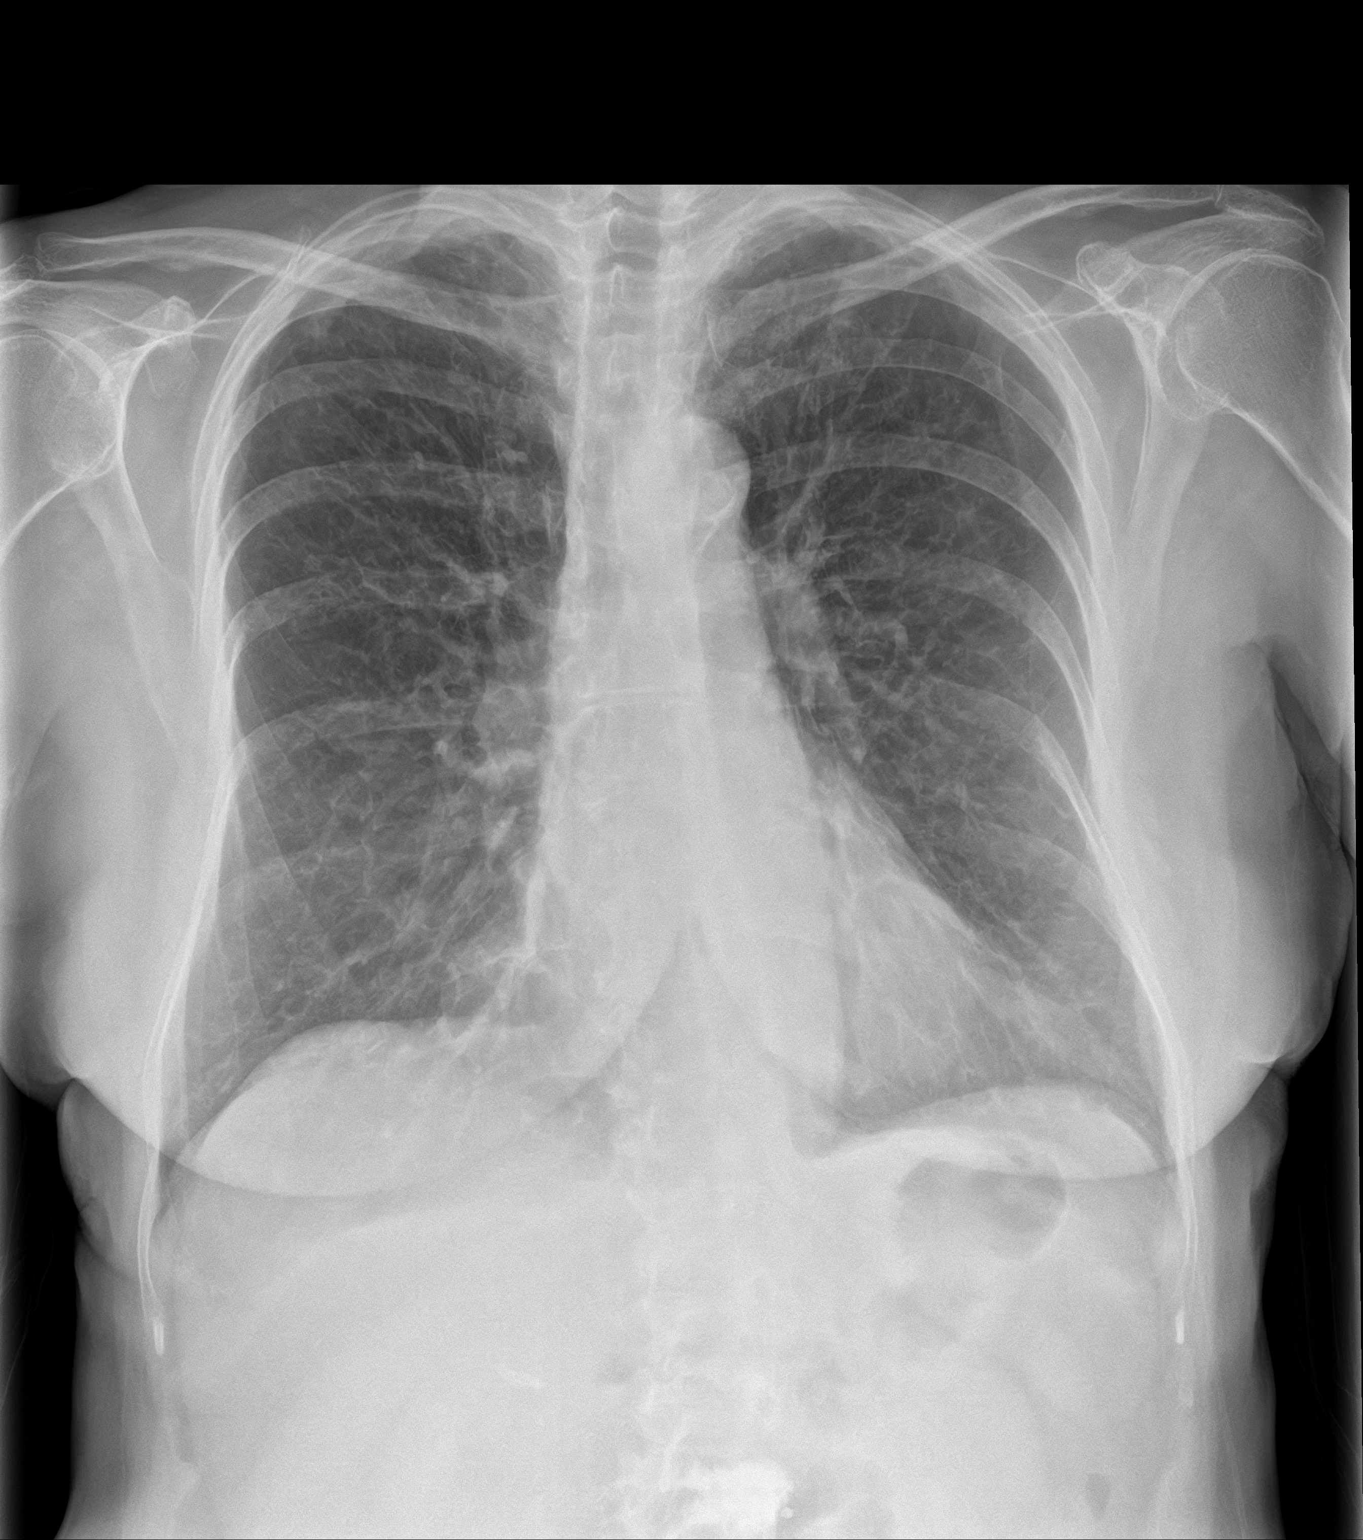
[im 2/2]
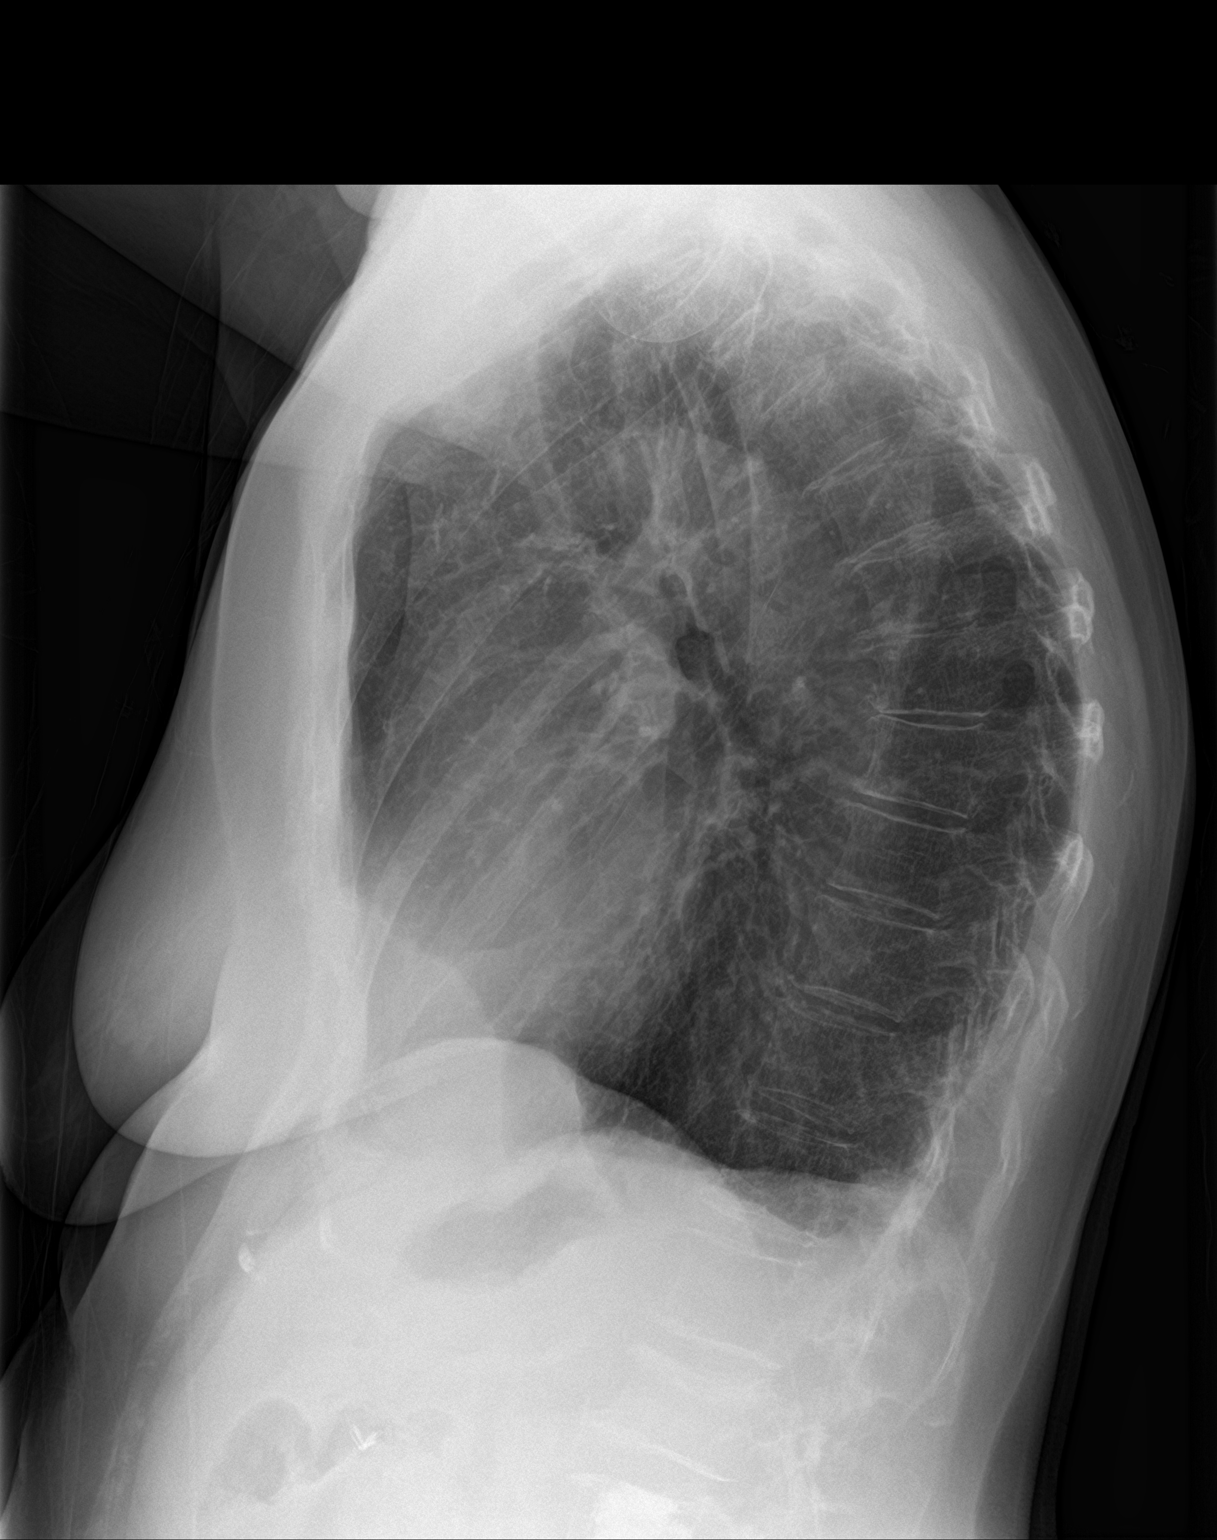

[2 of 2 positions shown; findings below may reference images not displayed]

FINDINGS: There are scattered areas of mild scarring bilaterally. No edema or
consolidation. Heart size and pulmonary vascularity are normal. No
adenopathy. There is aortic atherosclerosis. There is calcification
in the left carotid artery. No blastic or lytic bone lesions are
evident. There is evidence of previous kyphoplasty procedure in the
mid lumbar region.
IMPRESSION: Scattered areas of scarring bilaterally. No edema or consolidation.
There is aortic atherosclerosis as well as left carotid artery
calcification. No adenopathy evident.

Aortic Atherosclerosis ([id number].L).

## 2019-12-23 IMAGING — US US EXTREM LOW VENOUS*L*
1 series · 13 of 24 positions shown · non-contrast
Comparison: None.

CLINICAL DATA: Left leg swelling



[Series 1: us extrem low venous*left* · 0.07mm/px · 13 of 35 slices shown]
[im 1/35]
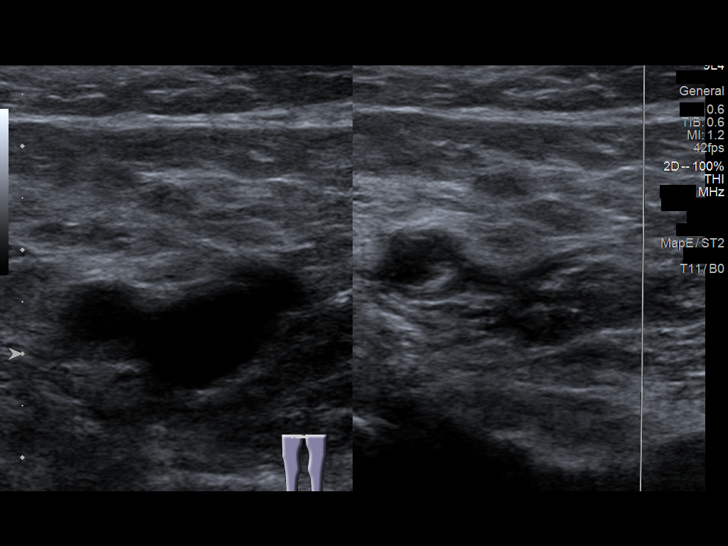
[im 3/35]
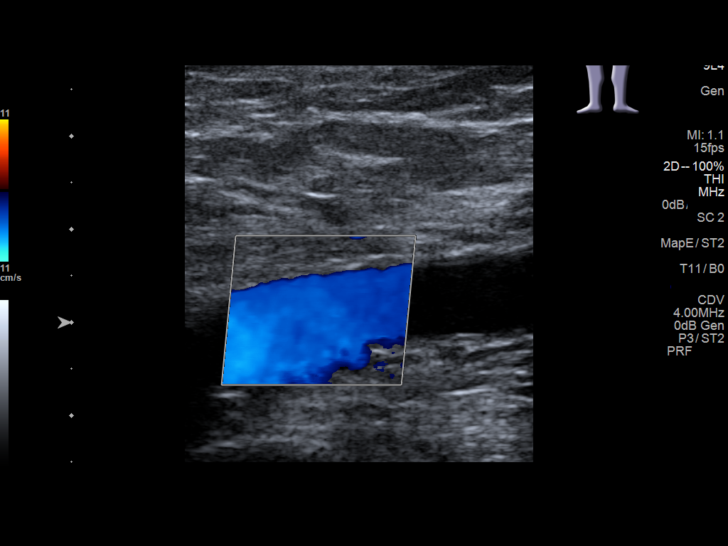
[im 6/35]
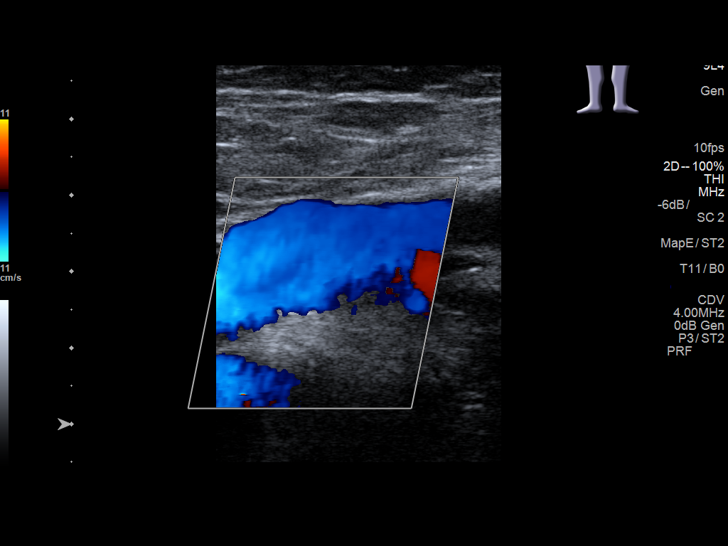
[im 9/35]
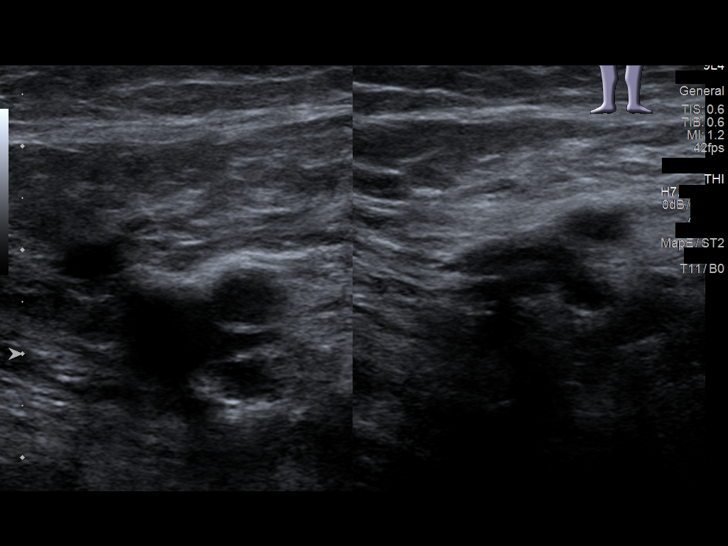
[im 12/35]
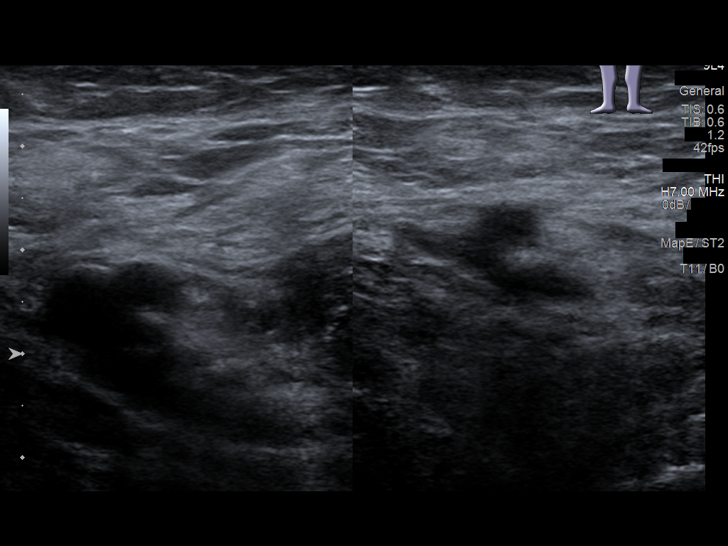
[im 15/35]
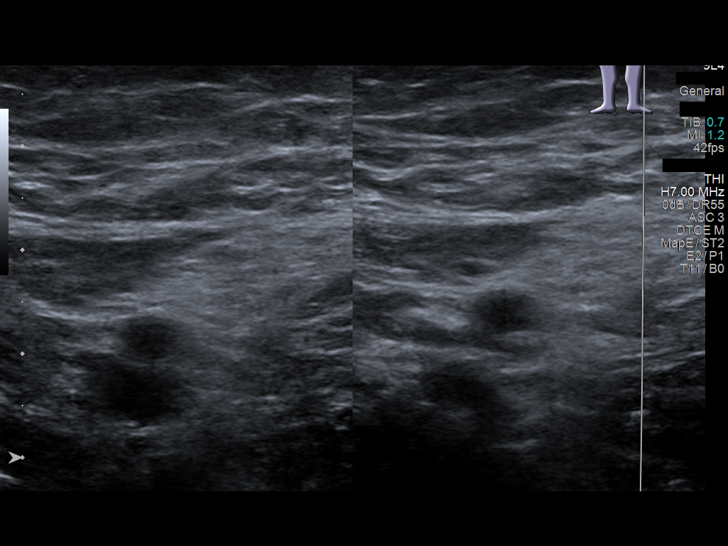
[im 18/35]
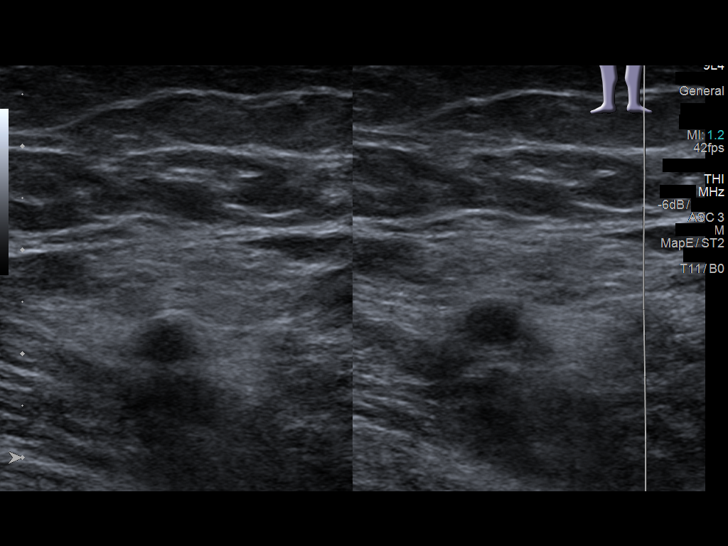
[im 20/35]
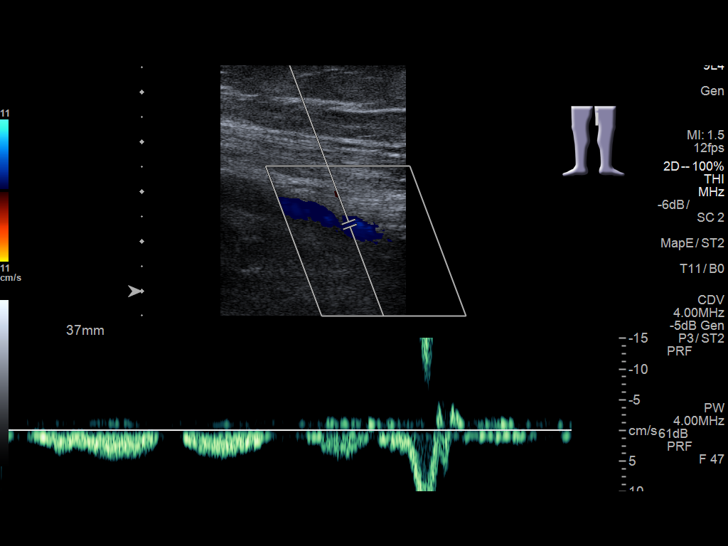
[im 23/35]
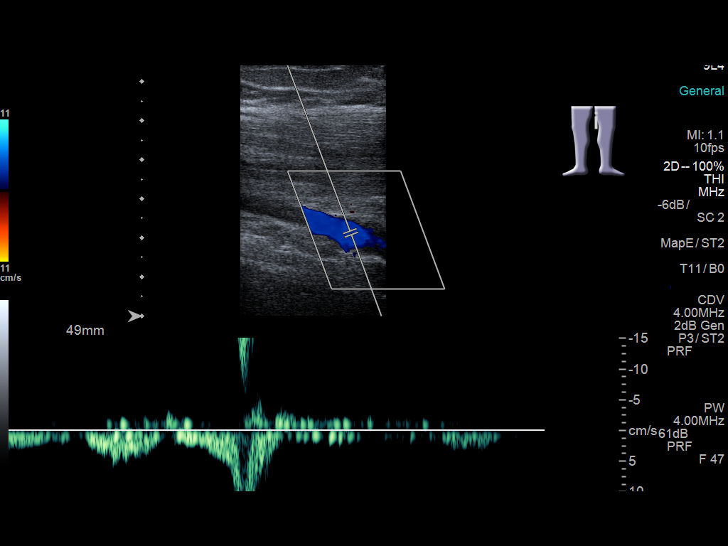
[im 26/35]
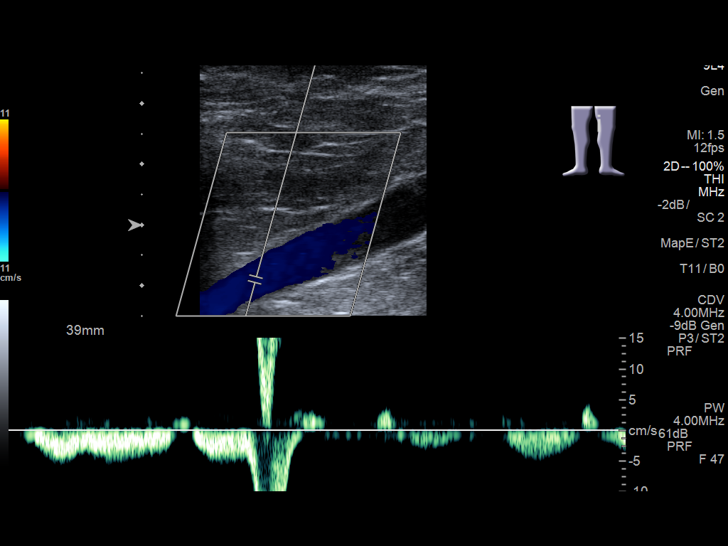
[im 29/35]
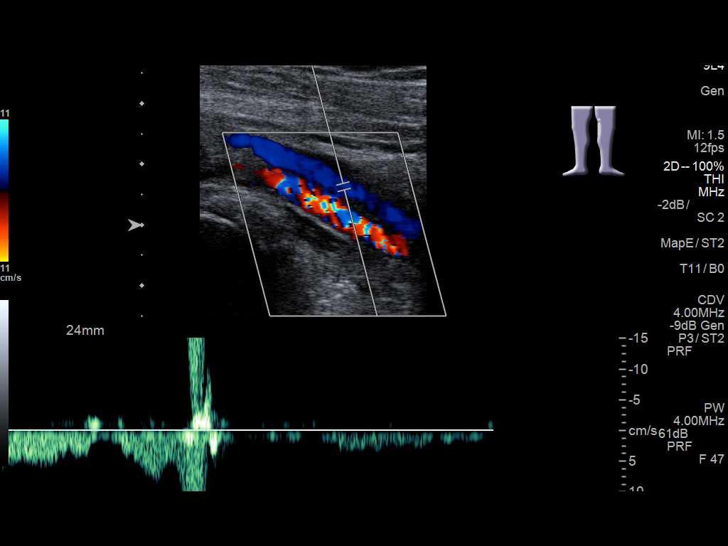
[im 32/35]
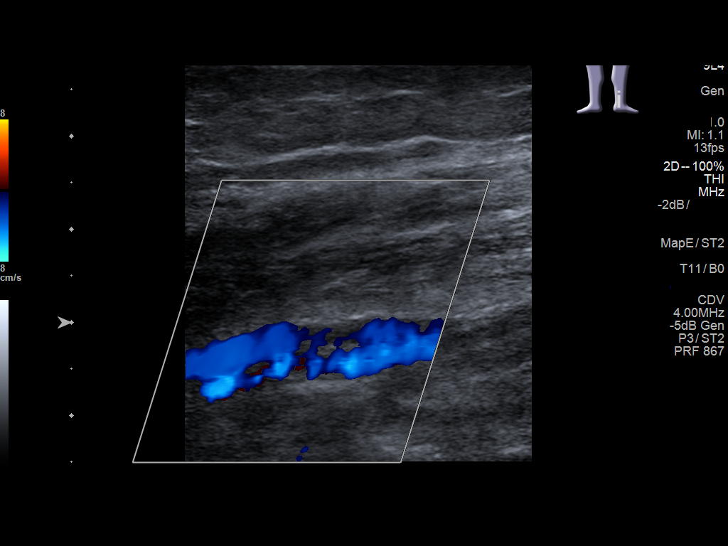
[im 35/35]
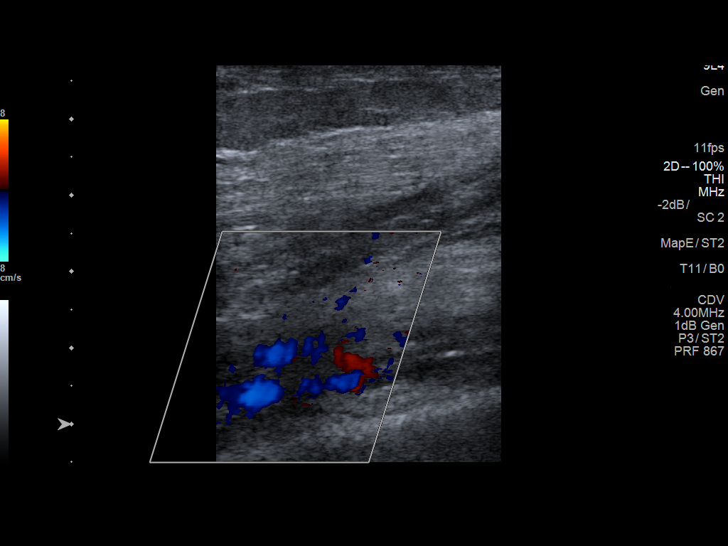

[13 of 24 positions shown; findings below may reference images not displayed]

FINDINGS: Contralateral Common Femoral Vein: Respiratory phasicity is normal
and symmetric with the symptomatic side. No evidence of thrombus.
Normal compressibility.

Common Femoral Vein: No evidence of thrombus. Normal
compressibility, respiratory phasicity and response to augmentation.

Saphenofemoral Junction: No evidence of thrombus. Normal
compressibility and flow on color Doppler imaging.

Profunda Femoral Vein: No evidence of thrombus. Normal
compressibility and flow on color Doppler imaging.

Femoral Vein: No evidence of thrombus. Normal compressibility,
respiratory phasicity and response to augmentation.

Popliteal Vein: No evidence of thrombus. Normal compressibility,
respiratory phasicity and response to augmentation.

Calf Veins: No evidence of thrombus. Normal compressibility and flow
on color Doppler imaging.

Superficial Great Saphenous Vein: No evidence of thrombus. Normal
compressibility.

Venous Reflux:  None.

Other Findings:  None.
IMPRESSION: No evidence of deep venous thrombosis.

These results will be called to the ordering clinician or
representative by the [HOSPITAL] at the imaging location.

## 2020-01-17 ENCOUNTER — Telehealth: Payer: Self-pay | Admitting: Internal Medicine

## 2020-01-17 NOTE — Telephone Encounter (Signed)
-----   Message from Salvatore Marvel sent at 01/17/2020  3:35 PM EDT ----- Regarding: remove pcp Pt has moved and found another pcp

## 2020-01-17 NOTE — Telephone Encounter (Signed)
PCP removed.
